# Patient Record
Sex: Female | Born: 1935
Health system: Southern US, Community
[De-identification: ages and names within clinical notes are randomized; demographics above are authoritative.]

## PROBLEM LIST (undated history)

## (undated) DIAGNOSIS — M1990 Unspecified osteoarthritis, unspecified site: Secondary | ICD-10-CM

## (undated) DIAGNOSIS — Z83518 Family history of other specified eye disorder: Secondary | ICD-10-CM

## (undated) DIAGNOSIS — M810 Age-related osteoporosis without current pathological fracture: Secondary | ICD-10-CM

## (undated) DIAGNOSIS — I4891 Unspecified atrial fibrillation: Secondary | ICD-10-CM

## (undated) DIAGNOSIS — I10 Essential (primary) hypertension: Secondary | ICD-10-CM

## (undated) DIAGNOSIS — E785 Hyperlipidemia, unspecified: Secondary | ICD-10-CM

## (undated) DIAGNOSIS — H269 Unspecified cataract: Secondary | ICD-10-CM

## (undated) HISTORY — DX: Unspecified osteoarthritis, unspecified site: M19.90

## (undated) HISTORY — DX: Unspecified atrial fibrillation: I48.91

## (undated) HISTORY — PX: CATARACT EXTRACTION: SUR2

## (undated) HISTORY — PX: APPENDECTOMY: SHX54

## (undated) HISTORY — DX: Unspecified cataract: H26.9

## (undated) HISTORY — DX: Family history of other specified eye disorder: Z83.518

## (undated) HISTORY — PX: VAGINAL HYSTERECTOMY: SUR661

## (undated) HISTORY — PX: TONSILLECTOMY: SUR1361

## (undated) HISTORY — DX: Hyperlipidemia, unspecified: E78.5

## (undated) HISTORY — DX: Essential (primary) hypertension: I10

## (undated) HISTORY — DX: Age-related osteoporosis without current pathological fracture: M81.0

## (undated) HISTORY — PX: OTHER SURGICAL HISTORY: SHX169

---

## 2006-05-03 HISTORY — PX: TOTAL HIP ARTHROPLASTY: SHX124

## 2012-05-03 DIAGNOSIS — I2699 Other pulmonary embolism without acute cor pulmonale: Secondary | ICD-10-CM

## 2012-05-03 HISTORY — DX: Other pulmonary embolism without acute cor pulmonale: I26.99

## 2019-11-02 ENCOUNTER — Encounter: Payer: Self-pay | Admitting: Internal Medicine

## 2019-11-02 ENCOUNTER — Ambulatory Visit (INDEPENDENT_AMBULATORY_CARE_PROVIDER_SITE_OTHER): Payer: Medicare Other | Admitting: Internal Medicine

## 2019-11-02 VITALS — BP 120/60 | HR 99 | Ht 63.0 in | Wt 148.0 lb

## 2019-11-02 DIAGNOSIS — R109 Unspecified abdominal pain: Secondary | ICD-10-CM

## 2019-11-02 DIAGNOSIS — R0789 Other chest pain: Secondary | ICD-10-CM | POA: Diagnosis not present

## 2019-11-02 DIAGNOSIS — K5900 Constipation, unspecified: Secondary | ICD-10-CM

## 2019-11-02 MED ORDER — BISACODYL EC 5 MG PO TBEC
DELAYED_RELEASE_TABLET | ORAL | 0 refills | Status: AC
Start: 2019-11-02 — End: ?

## 2019-11-02 MED ORDER — IBUPROFEN 200 MG PO CAPS: 400.0000 mg | ORAL_CAPSULE | Freq: Three times a day (TID) | ORAL | 0 refills | Status: DC

## 2019-11-02 MED ORDER — DICLOFENAC SODIUM 1 % EX GEL: 4.0000 g | Freq: Four times a day (QID) | CUTANEOUS | Status: DC

## 2019-11-02 NOTE — Progress Notes (Signed)
Caitlin Wilson 86 y.o. November 08, 1927 [id number]  Assessment & Plan:   Encounter Diagnoses  Name Primary?  . Abdominal wall pain Yes  . Xiphodynia   . Acute constipation     The history, examination, and negative CT scan all supports this diagnosis of abdominal wall pain and xiphoid tinea.  The constipation is probably related to not eating and may be less mobility.  Will treat as follows, using some over-the-counter ibuprofen 400 mg 3 times daily for a few days.  I do not think this would cause major kidney damage and she can take the pantoprazole prescribed but not yet started to reduce any risk of significant gastric irritation.  Moist heat bracing abdominal wall conservative measures.  If this fails to work then consider abdominal wall injection.  Bisacodyl for constipation  I still plan to review the labs though based upon what we know I think her CBC CMET lipase were all normal.  Meds ordered this encounter  Medications  . diclofenac Sodium (VOLTAREN) 1 % GEL    Sig: Apply 4 g topically 4 (four) times daily.  . Ibuprofen 200 MG CAPS    Sig: Take 2 capsules (400 mg total) by mouth in the morning, at noon, and at bedtime. X 3-4 days    Dispense:  120 capsule    Refill:  0  . bisacodyl (BISACODYL) 5 MG EC tablet    Sig: 1-2 daily as needed for constipation    Dispense:  30 tablet    Refill:  0   Follow-up can be as needed  I appreciate the opportunity to care for this patient.  CC: Caitlin Mallet, MD   Subjective:   Chief Complaint: Epigastric pain  HPI 86 year old divorced white woman who developed epigastric pain several days ago after returning from the beach.  She moved her luggage etc. does not remember an injury but she had sharp upper abdominal pains and thought maybe she had pulled a muscle.  She felt better the next day and then the symptoms came back where she had sharp fairly significant pains with movement bending etc.  Over time she became somewhat  constipated and has not moved her bowels much but she is not been eating either.  There is no radiation into the back.  No fever or chills.  No nausea or vomiting.  Does not recall having problems like this before.  No chronic GI issues.  She went to the emergency department in [facility] yesterday where a CT scan was unrevealing.  Records reviewed.  There was a focal area of wall narrowing along the rectosigmoid colon but nothing in the upper abdomen that would explain her pain.  She has been treated with tramadol with some relief.  I do not have her labs but we have requested those.  She is accompanied by family members 1 of whom is a physician today. No Known Allergies Current Meds  Medication Sig  . aspirin 325 MG tablet Take 325 mg by mouth daily.  Marland Kitchen losartan (COZAAR) 50 MG tablet Take 50 mg by mouth daily.  Marland Kitchen lovastatin (MEVACOR) 10 MG tablet Take 10 mg by mouth daily.  . metoprolol tartrate (LOPRESSOR) 50 MG tablet Take 50 mg by mouth daily.  . Multiple Vitamins-Minerals (PRESERVISION AREDS 2 PO) Take 1 capsule by mouth in the morning and at bedtime.  . traMADol (ULTRAM) 50 MG tablet Take 50 mg by mouth every 4 (four) hours as needed.    Past Medical History:  Diagnosis  Date  . Atrial fibrillation ([place])   . FH: cataracts   . Hyperlipidemia   . Hypertension   . Pulmonary emboli ([place]) 2014   Past Surgical History:  Procedure Laterality Date  . APPENDECTOMY    . CATARACT EXTRACTION Bilateral   . pelvic floor reconstruction    . TONSILLECTOMY     as a baby  . TOTAL HIP ARTHROPLASTY Left 2008  . VAGINAL HYSTERECTOMY     Social History   Social History Narrative   Divorced, 2 sons 1 daughter   Retired    Former smoker   2 alcohol/day   No tobacco/drugs   1 caffeine/day   family history includes Heart disease in her brother, brother, father, mother, sister, and sister; Lung cancer in her sister.   Review of Systems  otherwise negative  Objective:   Physical Exam BP 120/60    Pulse 99   Ht 5\' 3"  (1.6 m)   Wt 148 lb (67.1 kg)   BMI 26.22 kg/m  Elderly NAD In upper abdominal pain with movement  Tender xiphoid Tender epigastrium These sxs markedly increase with abdominal wall tension  Back and CVA without tenderness Alert and oriented x 3

## 2019-11-02 NOTE — Patient Instructions (Signed)
I think you have a bad abdominal wall strain that will get better with rest, moist heat, and some medication.  Try to avoid bending and twisting and do not lift anything that cannot be lifted with one hand with ease.  If you are coughing or sneezing brace the abdomen with a pillow.   Take 400 mg ibuprofen 3 times a day for next few days.  Purchase diclofenac gel and apply 4 grams to the area 3-4 times a day  For the constipation try bisacodyl (5mg ) 1-2 tabs.  Be sure to hydrate - drink plenty of fluids.  If this does not improve enough by next week let us know and we can consider an abdominal wall injection.   I appreciate the opportunity to care for you. Gatha Mayer, MD, [facility]

## 2020-02-14 ENCOUNTER — Ambulatory Visit (INDEPENDENT_AMBULATORY_CARE_PROVIDER_SITE_OTHER): Payer: Medicare Other | Admitting: Internal Medicine

## 2020-02-14 ENCOUNTER — Ambulatory Visit (INDEPENDENT_AMBULATORY_CARE_PROVIDER_SITE_OTHER)
Admission: RE | Admit: 2020-02-14 | Discharge: 2020-02-14 | Disposition: A | Discharge status: Home / Self Care | Payer: Medicare Other | Source: Ambulatory Visit | Attending: Internal Medicine | Admitting: Internal Medicine

## 2020-02-14 ENCOUNTER — Other Ambulatory Visit: Payer: Self-pay

## 2020-02-14 ENCOUNTER — Other Ambulatory Visit (INDEPENDENT_AMBULATORY_CARE_PROVIDER_SITE_OTHER): Payer: Medicare Other

## 2020-02-14 ENCOUNTER — Encounter: Payer: Self-pay | Admitting: Internal Medicine

## 2020-02-14 VITALS — BP 134/60 | HR 88 | Ht 59.5 in | Wt 139.4 lb

## 2020-02-14 DIAGNOSIS — R194 Change in bowel habit: Secondary | ICD-10-CM | POA: Diagnosis not present

## 2020-02-14 DIAGNOSIS — R634 Abnormal weight loss: Secondary | ICD-10-CM | POA: Diagnosis not present

## 2020-02-14 DIAGNOSIS — R1011 Right upper quadrant pain: Secondary | ICD-10-CM

## 2020-02-14 DIAGNOSIS — K219 Gastro-esophageal reflux disease without esophagitis: Secondary | ICD-10-CM | POA: Diagnosis not present

## 2020-02-14 LAB — CBC WITH DIFFERENTIAL/PLATELET
Basophils Absolute: 0 10*3/uL (ref 0.0–0.1)
Basophils Relative: 0.5 % (ref 0.0–3.0)
Eosinophils Absolute: 0.1 10*3/uL (ref 0.0–0.7)
Eosinophils Relative: 1.1 % (ref 0.0–5.0)
HCT: 37.8 % (ref 36.0–46.0)
Hemoglobin: 12.5 g/dL (ref 12.0–15.0)
Lymphocytes Relative: 25.6 % (ref 12.0–46.0)
Lymphs Abs: 2.1 10*3/uL (ref 0.7–4.0)
MCHC: 33.1 g/dL (ref 30.0–36.0)
MCV: 91.6 fl (ref 78.0–100.0)
Monocytes Absolute: 0.9 10*3/uL (ref 0.1–1.0)
Monocytes Relative: 11.2 % (ref 3.0–12.0)
Neutro Abs: 5.1 10*3/uL (ref 1.4–7.7)
Neutrophils Relative %: 61.6 % (ref 43.0–77.0)
Platelets: 370 10*3/uL (ref 150.0–400.0)
RBC: 4.12 Mil/uL (ref 3.87–5.11)
RDW: 14.8 % (ref 11.5–15.5)
WBC: 8.4 10*3/uL (ref 4.0–10.5)

## 2020-02-14 LAB — COMPREHENSIVE METABOLIC PANEL
ALT: 10 U/L (ref 0–35)
AST: 20 U/L (ref 0–37)
Albumin: 4.2 g/dL (ref 3.5–5.2)
Alkaline Phosphatase: 68 U/L (ref 39–117)
BUN: 25 mg/dL — ABNORMAL HIGH (ref 6–23)
CO2: 30 mEq/L (ref 19–32)
Calcium: 10 mg/dL (ref 8.4–10.5)
Chloride: 98 mEq/L (ref 96–112)
Creatinine, Ser: 1.32 mg/dL — ABNORMAL HIGH (ref 0.40–1.20)
GFR: 35.42 mL/min — ABNORMAL LOW (ref >60.00)
Glucose, Bld: 105 mg/dL — ABNORMAL HIGH (ref 70–99)
Potassium: 5.3 mEq/L — ABNORMAL HIGH (ref 3.5–5.1)
Sodium: 134 mEq/L — ABNORMAL LOW (ref 135–145)
Total Bilirubin: 0.5 mg/dL (ref 0.2–1.2)
Total Protein: 7.2 g/dL (ref 6.0–8.3)

## 2020-02-14 LAB — TSH: TSH: 1.58 u[IU]/mL (ref 0.35–4.50)

## 2020-02-14 NOTE — Patient Instructions (Signed)
You have been scheduled for an endoscopy and colonoscopy. Please follow the written instructions given to you at your visit today. Please pick up your prep supplies at the pharmacy within the next 1-3 days. If you use inhalers (even only as needed), please bring them with you on the day of your procedure.  Your provider has requested that you go to the basement level for lab work before leaving today. Press "B" on the elevator. The lab is located at the first door on the left as you exit the elevator.  Due to recent changes in healthcare laws, you may see the results of your imaging and laboratory studies on MyChart before your provider has had a chance to review them.  We understand that in some cases there may be results that are confusing or concerning to you. Not all laboratory results come back in the same time frame and the provider may be waiting for multiple results in order to interpret others.  Please give [place] 48 hours in order for your provider to thoroughly review all the results before contacting the office for clarification of your results.    Please go by the x-ray department before leaving and get an x-ray.  Go back on the pantoprazole daily-Take 30 minutes prior to breakfast.  Apply over the counter Voltaren Gel on her abdominal pain area.   I appreciate the opportunity to care for you. Silvano Rusk, MD, [facility]

## 2020-02-14 NOTE — Progress Notes (Signed)
Caitlin Wilson 86 y.o. 1927/12/13 [id number]  Assessment & Plan:   Encounter Diagnoses  Name Primary?  . Loss of weight Yes  . Change in bowel habits   . RUQ pain   . Gastric reflux    Given this complex of signs and symptoms, and the focal narrowing in the rectosigmoid on prior CT scan, I think an endoscopic evaluation is appropriate.  Unintentional weight loss and the bowel habit changes are concerning.  It may be that she is taking more MiraLAX and she needs accounting for the bowel habit changes though that should make her lose weight.  The hiatal hernia may be causing the reflux.  The right upper quadrant pain and tenderness still seems like it is musculoskeletal to me but not certain.  I have asked her to start pantoprazole 40 mg daily again or an over-the-counter PPI.  She will also use the Voltaren topical gel in the right upper quadrant.  Plan for EGD and colonoscopy when she returns from her trip.The risks and benefits as well as alternatives of endoscopic procedure(s) have been discussed and reviewed. All questions answered. The patient agrees to proceed.  Other work-up as below.  Consider reducing the dose of MiraLAX pending review of x-ray.  Orders Placed This Encounter  Procedures  . DG Abd 2 Views  . CBC with Differential/Platelet  . Comprehensive metabolic panel  . TSH  . Ambulatory referral to Gastroenterology    I appreciate the opportunity to care for this patient. CC: [name], MD    Subjective:   Chief Complaint: Multiple including abdominal pain change in bowels weight loss reflux and bloating  HPI Patient is a 86 year old white woman seen on July 2 with what I thought was abdominal wall pain and is back with several complaints.  She is accompanied by her daughter and granddaughter.  Much of that abdominal pain that she had attributed to an abdominal wall strain is better though she still has intermittent right-sided abdominal pain that  comes and goes and if she lies on her left side and waits it will improve and she will be okay.  It is not related to movement or eating as far she can tell not related to bowel habits.  It does not disturb her sleep.  She has been having problems with her bowels, she was constipated somewhat when I had seen her and she did a bit of a purge and it has been on MiraLAX.  She is having explosive gas and spraying the toilet and having some incontinence episodes.  Stools tend to be loose though at times they are very small and narrow or pebble-like.  No bleeding except occasionally on the toilet paper only.  She feels bloated and like she really is not emptying her bowels completely when she does defecate.  She is also been having problems with regurgitation and reflux.  Food "comes back".  She does not necessarily have dysphagia.  She feels like she might need to vomit but she has not.  There is heartburn as well.  I think she sometimes uses some antacids like Tums.  When she was seen in July I asked her to take pantoprazole while she was taking ibuprofen.  She is not taking that anymore.  Voltaren gel was also something she was using but has stopped using that.  She has also noted some unintentional weight loss.  Wt Readings from Last 3 Encounters:  02/14/20 139 lb 6 oz (63.2 kg)  11/02/19  148 lb (67.1 kg)   CT scanning she had as part of her abdominal pain evaluation in the summer noted diverticulosis and a focal thickening in the rectosigmoid without any type of a mass noted.  She also had a moderate hiatal hernia described.  Laboratory testing at that time was unrevealing.  She is preparing to go to the beach for a couple of weeks and will return in early November.  No Known Allergies Current Meds  Medication Sig  . aspirin 325 MG tablet Take 325 mg by mouth daily.  . bisacodyl (BISACODYL) 5 MG EC tablet 1-2 daily as needed for constipation  . diclofenac Sodium (VOLTAREN) 1 % GEL Apply 4 g  topically 4 (four) times daily.  . Ibuprofen 200 MG CAPS Take 2 capsules (400 mg total) by mouth in the morning, at noon, and at bedtime. X 3-4 days  . losartan (COZAAR) 50 MG tablet Take 50 mg by mouth daily.  Marland Kitchen lovastatin (MEVACOR) 10 MG tablet Take 10 mg by mouth daily.  . metoprolol tartrate (LOPRESSOR) 50 MG tablet Take 50 mg by mouth daily.  . Multiple Vitamins-Minerals (PRESERVISION AREDS 2 PO) Take 1 capsule by mouth in the morning and at bedtime.  . pantoprazole (PROTONIX) 40 MG tablet Daily while taking ibuprofen  . traMADol (ULTRAM) 50 MG tablet Take 50 mg by mouth every 4 (four) hours as needed.    Past Medical History:  Diagnosis Date  . Atrial fibrillation ([place])   . FH: cataracts   . Hyperlipidemia   . Hypertension   . Pulmonary emboli ([place]) 2014   Past Surgical History:  Procedure Laterality Date  . APPENDECTOMY    . CATARACT EXTRACTION Bilateral   . pelvic floor reconstruction    . TONSILLECTOMY     as a baby  . TOTAL HIP ARTHROPLASTY Left 2008  . VAGINAL HYSTERECTOMY     Social History   Social History Narrative   Divorced, 2 sons 1 daughter   Retired    Former smoker   2 alcohol/day   No tobacco/drugs   1 caffeine/day   family history includes Heart disease in her brother, brother, father, mother, sister, and sister; Lung cancer in her sister.   Review of Systems As per HPI  Objective:   Physical Exam BP 134/60 (BP Location: Left Arm, Patient Position: Sitting, Cuff Size: Normal)   Pulse 88   Ht 4' 11.5" (1.511 m) Comment: height measured without shoes  Wt 139 lb 6 oz (63.2 kg)   BMI 27.68 kg/m  Spry elderly white woman in no acute distress The abdomen is slightly protuberant soft she is tender in the right upper quadrant and persistently so with abdominal wall tension there is slight guarding.  The ribs are not tender.  There are no masses.  Bowel sounds are present and normal and there are no bruits.  Rectal exam was performed in the presence  of Patti [place], [place].  There is no fecal impaction.  Normal anoderm except for some slight fleshy tag/external hemorrhoid.

## 2020-03-03 HISTORY — PX: ESOPHAGOGASTRODUODENOSCOPY: SHX1529

## 2020-03-03 HISTORY — PX: COLONOSCOPY: SHX174

## 2020-03-13 ENCOUNTER — Telehealth: Payer: Self-pay

## 2020-03-13 NOTE — Telephone Encounter (Signed)
Called Dr. Macarthur Critchley, S. Office and spoke to [place]. She will fax patient's last office note (02/2020) and last EKG to [place] at [phone] (Att: Sheri)

## 2020-03-13 NOTE — Telephone Encounter (Signed)
-----   Message from Gatha Mayer, MD sent at 03/13/2020  1:32 PM EST ----- Regarding: RE: [place] pt Sure - Barbera Setters - would you contact Dr. Karna Christmas office for latest EKG and office notes on her? - please  She did have negative troponins in ED in July - it is in media section   FYI this is Jay's great aunt and her granddaughter is Dr. Ernest Mallick of [facility]  ----- Message ----- From: Osvaldo Angst, CRNA Sent: 03/13/2020   7:43 AM EST To: Gatha Mayer, MD Subject: [facility] pt                                         Dr. Carlean Purl,  This 86 yo pt is scheduled with you on 11/19.  Unfortunately the documentation of recent PMD visits and ECG are not in Epic.  Would it be possible to obtain these documents so I can evaluate her for care at [facility]?  Thanks,  Osvaldo Angst

## 2020-03-14 ENCOUNTER — Telehealth: Payer: Self-pay

## 2020-03-14 NOTE — Telephone Encounter (Signed)
Dollene Cleveland, RN called yesterday to have these sent to [place]. They have not come yet.

## 2020-03-14 NOTE — Telephone Encounter (Signed)
-----   Message from Gatha Mayer, MD sent at 03/14/2020 12:50 PM EST ----- Regarding: FW: [place] pt Please call her PCP office and ask them for most recent EKG and PCP notes and any cardiology notes  ----- Message ----- From: Osvaldo Angst, CRNA Sent: 03/13/2020   7:43 AM EST To: Gatha Mayer, MD Subject: [facility] pt                                         Dr. Carlean Purl,  This 86 yo pt is scheduled with you on 11/19.  Unfortunately the documentation of recent PMD visits and ECG are not in Epic.  Would it be possible to obtain these documents so I can evaluate her for care at [facility]?  Thanks,  Osvaldo Angst

## 2020-03-21 ENCOUNTER — Encounter: Payer: Self-pay | Admitting: Internal Medicine

## 2020-03-21 ENCOUNTER — Other Ambulatory Visit: Payer: Self-pay

## 2020-03-21 ENCOUNTER — Other Ambulatory Visit: Payer: Self-pay | Admitting: Internal Medicine

## 2020-03-21 ENCOUNTER — Ambulatory Visit (AMBULATORY_SURGERY_CENTER): Payer: Medicare Other | Admitting: Internal Medicine

## 2020-03-21 VITALS — BP 142/60 | HR 84 | Temp 96.9°F | Resp 25 | Ht 59.0 in | Wt 139.0 lb

## 2020-03-21 DIAGNOSIS — K2950 Unspecified chronic gastritis without bleeding: Secondary | ICD-10-CM | POA: Diagnosis not present

## 2020-03-21 DIAGNOSIS — B9681 Helicobacter pylori [H. pylori] as the cause of diseases classified elsewhere: Secondary | ICD-10-CM | POA: Diagnosis not present

## 2020-03-21 DIAGNOSIS — D120 Benign neoplasm of cecum: Secondary | ICD-10-CM | POA: Diagnosis not present

## 2020-03-21 DIAGNOSIS — R634 Abnormal weight loss: Secondary | ICD-10-CM

## 2020-03-21 DIAGNOSIS — K449 Diaphragmatic hernia without obstruction or gangrene: Secondary | ICD-10-CM

## 2020-03-21 DIAGNOSIS — K2970 Gastritis, unspecified, without bleeding: Secondary | ICD-10-CM

## 2020-03-21 DIAGNOSIS — D122 Benign neoplasm of ascending colon: Secondary | ICD-10-CM

## 2020-03-21 DIAGNOSIS — K2100 Gastro-esophageal reflux disease with esophagitis, without bleeding: Secondary | ICD-10-CM | POA: Diagnosis not present

## 2020-03-21 DIAGNOSIS — K5730 Diverticulosis of large intestine without perforation or abscess without bleeding: Secondary | ICD-10-CM

## 2020-03-21 DIAGNOSIS — K222 Esophageal obstruction: Secondary | ICD-10-CM | POA: Diagnosis not present

## 2020-03-21 MED ORDER — ONDANSETRON 4 MG PO TBDP: 4.0000 mg | ORAL_TABLET | Freq: Four times a day (QID) | ORAL | 0 refills | Status: DC | PRN

## 2020-03-21 MED ORDER — SODIUM CHLORIDE 0.9 % IV SOLN: 500.0000 mL | Freq: Once | INTRAVENOUS | Status: DC

## 2020-03-21 MED ORDER — PANTOPRAZOLE SODIUM 40 MG PO TBEC: DELAYED_RELEASE_TABLET | ORAL | 11 refills | Status: AC

## 2020-03-21 MED FILL — ONDANSETRON ODT 4 MG TABLET: 4 | 5 days supply | Qty: 20 | Fill #0

## 2020-03-21 NOTE — Op Note (Signed)
[place] Patient Name: Caitlin Wilson Procedure Date: 03/21/2020 8:44 AM MRN: [id number] Endoscopist: Gatha Mayer , MD Age: 86 Referring MD:  Date of Birth: June 09, 1927 Gender: Female Account #: [id number] Procedure:                Upper GI endoscopy Indications:              Weight loss Medicines:                Propofol per Anesthesia, Monitored Anesthesia Care Procedure:                Pre-Anesthesia Assessment:                           - Prior to the procedure, a History and Physical                            was performed, and patient medications and                            allergies were reviewed. The patient's tolerance of                            previous anesthesia was also reviewed. The risks                            and benefits of the procedure and the sedation                            options and risks were discussed with the patient.                            All questions were answered, and informed consent                            was obtained. Prior Anticoagulants: The patient has                            taken no previous anticoagulant or antiplatelet                            agents. ASA Grade Assessment: III - A patient with                            severe systemic disease. After reviewing the risks                            and benefits, the patient was deemed in                            satisfactory condition to undergo the procedure.                           After obtaining informed consent, the endoscope was  passed under direct vision. Throughout the                            procedure, the patient's blood pressure, pulse, and                            oxygen saturations were monitored continuously. The                            Endoscope was introduced through the mouth, and                            advanced to the second part of duodenum. The upper                            GI endoscopy  was accomplished without difficulty.                            The patient tolerated the procedure well. Scope In: Scope Out: Findings:                 LA Grade B (one or more mucosal breaks greater than                            5 mm, not extending between the tops of two mucosal                            folds) esophagitis was found in the distal                            esophagus. Biopsies were taken with a cold forceps                            for histology. Verification of patient                            identification for the specimen was done. Estimated                            blood loss was minimal.                           One benign-appearing, intrinsic mild stenosis was                            found at the gastroesophageal junction. The                            stenosis was traversed. Biopsies were taken with a                            cold forceps for histology. Verification of patient  identification for the specimen was done. Estimated                            blood loss was minimal.                           A 6 cm hiatal hernia was present.                           Diffuse moderate inflammation characterized by                            erythema, white discoloration, granularity and                            mucus was found in the entire examined stomach.                            Biopsies were taken with a cold forceps for                            histology. Verification of patient identification                            for the specimen was done. Estimated blood loss was                            minimal.                           The examined duodenum was normal.                           The cardia and gastric fundus were normal on                            retroflexion. Complications:            No immediate complications. Estimated Blood Loss:     Estimated blood loss was minimal. Impression:                - LA Grade B reflux esophagitis. Biopsied.                           - Benign-appearing esophageal stenosis. Biopsied.                           - 6 cm hiatal hernia.                           - Chronic gastritis. Biopsied.                           - Normal examined duodenum. Recommendation:           - Patient has a contact number available for  emergencies. The signs and symptoms of potential                            delayed complications were discussed with the                            patient. Return to normal activities tomorrow.                            Written discharge instructions were provided to the                            patient.                           - Resume previous diet.                           - Continue present medications.                           - Await pathology results.                           - See the other procedure note for documentation of                            additional recommendations.                           - Use Protonix (pantoprazole) 40 mg PO daily                            indefinitely. RESTART                           - Await pathology results. Gatha Mayer, MD 03/21/2020 9:45:04 AM This report has been signed electronically.

## 2020-03-21 NOTE — Progress Notes (Signed)
Medical history reviewed with patient, no changes noted. VS assessed by C.W

## 2020-03-21 NOTE — Progress Notes (Signed)
Pt arrives to PACU , family at bedside, pt drowsy, c/o of having to "pee", bedpan given, encouraged pt to pass gas, pt does pass gas, urinates.  Still c/o some tenderness to abdomen.  Abdomen distended but not firm, after 15 mins,  Dr Carlean Purl assesses pt.,pt up to bathroom after monitors were taken off, passes more gas, back to the gurney and repositioned to pass gas, Dr Carlean Purl comes back to assess pt, pt states pain is less than earlier, abdomen remains distended but not firm, states pt does not need rectal tube. Pt repositioned on bed, walked around, back to bathroom and passes more gas, gets dressed with assistance of family, discharged 40 minutes after she was disconnected from the monitor.

## 2020-03-21 NOTE — Progress Notes (Signed)
A/ox3, pleased with MAC, report to RN 

## 2020-03-21 NOTE — Progress Notes (Signed)
Called to room to assist during endoscopic procedure.  Patient ID and intended procedure confirmed with present staff. Received instructions for my participation in the procedure from the performing physician.   Pt vomited in the parking lot.  Family asked if nausea medication could be called in.  Per Dr. Carlean Purl Zofran 4 mg ODT po q 6 hours PRN #20 sent to [facility]

## 2020-03-21 NOTE — Op Note (Signed)
[facility] Patient Name: Caitlin Wilson Procedure Date: 03/21/2020 9:06 AM MRN: [id number] Endoscopist: Gatha Mayer , MD Age: 86 Referring MD:  Date of Birth: [birth year] Gender: Female Account #: [id number] Procedure:                Colonoscopy Indications:              Change in bowel habits, Weight loss Medicines:                Propofol per Anesthesia, Monitored Anesthesia Care Procedure:                Pre-Anesthesia Assessment:                           - Prior to the procedure, a History and Physical                            was performed, and patient medications and                            allergies were reviewed. The patient's tolerance of                            previous anesthesia was also reviewed. The risks                            and benefits of the procedure and the sedation                            options and risks were discussed with the patient.                            All questions were answered, and informed consent                            was obtained. Prior Anticoagulants: The patient has                            taken no previous anticoagulant or antiplatelet                            agents. ASA Grade Assessment: III - A patient with                            severe systemic disease. After reviewing the risks                            and benefits, the patient was deemed in                            satisfactory condition to undergo the procedure.                           - Prior to the procedure, a History and Physical  was performed, and patient medications and                            allergies were reviewed. The patient's tolerance of                            previous anesthesia was also reviewed. The risks                            and benefits of the procedure and the sedation                            options and risks were discussed with the patient.                            All  questions were answered, and informed consent                            was obtained. Prior Anticoagulants: The patient has                            taken no previous anticoagulant or antiplatelet                            agents. ASA Grade Assessment: III - A patient with                            severe systemic disease. After reviewing the risks                            and benefits, the patient was deemed in                            satisfactory condition to undergo the procedure.                           After obtaining informed consent, the colonoscope                            was passed under direct vision. Throughout the                            procedure, the patient's blood pressure, pulse, and                            oxygen saturations were monitored continuously. The                            Colonoscope was introduced through the anus and                            advanced to the the cecum, identified by  appendiceal orifice and ileocecal valve. The                            colonoscopy was performed with difficulty due to                            bowel stenosis. Successful completion of the                            procedure was aided by withdrawing the scope and                            replacing with the adult endoscope. The patient                            tolerated the procedure well. The quality of the                            bowel preparation was excellent. The ileocecal                            valve, appendiceal orifice, and rectum were                            photographed. The bowel preparation used was                            Miralax via split dose instruction. Scope In: 9:07:29 AM Scope Out: 9:30:14 AM Scope Withdrawal Time: 0 hours 10 minutes 6 seconds  Total Procedure Duration: 0 hours 22 minutes 45 seconds  Findings:                 The perianal and digital rectal examinations were                             normal.                           A benign-appearing, intrinsic severe stenosis                            measuring less than one cm (in length) was found in                            the distal sigmoid colon and was traversed.                           Many small and large-mouthed diverticula were found                            in the sigmoid colon and descending colon. There                            was narrowing of the colon in association with the  diverticular opening.                           Three sessile polyps were found in the ascending                            colon and cecum. The polyps were diminutive in                            size. These polyps were removed with a cold snare.                            Resection and retrieval were complete. Verification                            of patient identification for the specimen was                            done. Estimated blood loss was minimal.                           The exam was otherwise without abnormality on                            direct and retroflexion views. Complications:            No immediate complications. Estimated Blood Loss:     Estimated blood loss was minimal. Impression:               - Stricture in the distal sigmoid colon.                            Gastroscope necessary to pass. This looks like                            benign diverticular-associated stricturte                           - Severe diverticulosis in the sigmoid colon and in                            the descending colon. There was narrowing of the                            colon in association with the diverticular opening.                           - Three diminutive polyps in the ascending colon                            and in the cecum, removed with a cold snare.                            Resected and retrieved.                           -  The examination was otherwise normal on  direct                            and retroflexion views. Except for mild right colon                            barotrauma Recommendation:           - Patient has a contact number available for                            emergencies. The signs and symptoms of potential                            delayed complications were discussed with the                            patient. Return to normal activities tomorrow.                            Written discharge instructions were provided to the                            patient.                           - Resume previous diet.                           - Continue present medications.                           - No repeat colonoscopy due to age.                           - Await pathology results.                           - Benefiber 1 tbsp qd, increase or add MiraLax if                            needed Gatha Mayer, MD 03/21/2020 9:52:30 AM This report has been signed electronically.

## 2020-03-21 NOTE — Patient Instructions (Addendum)
There was a hiatal hernia, inflamed esophagus from reflux, and gastritis (stomach inflammation) on the upper exam. Mild narrowing or stricture where esophagus and stomach meet. Biopsies taken.  Colonoscopy had a stricture in sigmoid colon requiring use of gastroscope (small scope) to pass. This is a benign stricture associated with diverticulosis. Severe diverticulosis noted. 3 tiny polyps removed.  All else ok.  Recommendations:  1) Restart and stay on pantoprazole 2) Benefiber 1 tablespoon daily - use MiraLax if that is needed (for good bowel movements) 3) Will call with biopsy results and follow-up plans  I appreciate the opportunity to care for you. Gatha Mayer, MD, [facility]  Handouts given for Polyps, Diverticulosis, Hiatal Hernia, Gastritis and esophagitis.   YOU HAD AN ENDOSCOPIC PROCEDURE TODAY AT [facility]:   Refer to the procedure report that was given to you for any specific questions about what was found during the examination.  If the procedure report does not answer your questions, please call your gastroenterologist to clarify.  If you requested that your care partner not be given the details of your procedure findings, then the procedure report has been included in a sealed envelope for you to review at your convenience later.  YOU SHOULD EXPECT: Some feelings of bloating in the abdomen. Passage of more gas than usual.  Walking can help get rid of the air that was put into your GI tract during the procedure and reduce the bloating. If you had a lower endoscopy (such as a colonoscopy or flexible sigmoidoscopy) you may notice spotting of blood in your stool or on the toilet paper. If you underwent a bowel prep for your procedure, you may not have a normal bowel movement for a few days.  Please Note:  You might notice some irritation and congestion in your nose or some drainage.  This is from the oxygen used during your procedure.  There is no need for concern  and it should clear up in a day or so.  SYMPTOMS TO REPORT IMMEDIATELY:   Following lower endoscopy (colonoscopy or flexible sigmoidoscopy):  Excessive amounts of blood in the stool  Significant tenderness or worsening of abdominal pains  Swelling of the abdomen that is new, acute  Fever of 100F or higher   Following upper endoscopy (EGD)  Vomiting of blood or coffee ground material  New chest pain or pain under the shoulder blades  Painful or persistently difficult swallowing  New shortness of breath  Fever of 100F or higher  Black, tarry-looking stools  For urgent or emergent issues, a gastroenterologist can be reached at any hour by calling [phone]. Do not use MyChart messaging for urgent concerns.    DIET:  We do recommend a small meal at first, but then you may proceed to your regular diet.  Drink plenty of fluids but you should avoid alcoholic beverages for 24 hours.  ACTIVITY:  You should plan to take it easy for the rest of today and you should NOT DRIVE or use heavy machinery until tomorrow (because of the sedation medicines used during the test).    FOLLOW UP: Our staff will call the number listed on your records 48-72 hours following your procedure to check on you and address any questions or concerns that you may have regarding the information given to you following your procedure. If we do not reach you, we will leave a message.  We will attempt to reach you two times.  During this call, we will ask  if you have developed any symptoms of COVID 19. If you develop any symptoms (ie: fever, flu-like symptoms, shortness of breath, cough etc.) before then, please call [phone].  If you test positive for Covid 19 in the 2 weeks post procedure, please call and report this information to [place].    If any biopsies were taken you will be contacted by phone or by letter within the next 1-3 weeks.  Please call us at [phone] if you have not heard about the biopsies in 3  weeks.    SIGNATURES/CONFIDENTIALITY: You and/or your care partner have signed paperwork which will be entered into your electronic medical record.  These signatures attest to the fact that that the information above on your After Visit Summary has been reviewed and is understood.  Full responsibility of the confidentiality of this discharge information lies with you and/or your care-partner.

## 2020-03-24 ENCOUNTER — Telehealth: Payer: Self-pay

## 2020-03-24 NOTE — Telephone Encounter (Signed)
NO ANSWER, MESSAGE LEFT FOR PATIENT. 

## 2020-03-24 NOTE — Telephone Encounter (Signed)
  Follow up Call-  Call back number 03/21/2020  Post procedure Call Back phone  # [phone] or [phone]  Permission to leave phone message Yes     2nd follow up call made.  NALM

## 2020-04-02 ENCOUNTER — Encounter: Payer: Self-pay | Admitting: Internal Medicine

## 2020-04-02 DIAGNOSIS — K2970 Gastritis, unspecified, without bleeding: Secondary | ICD-10-CM

## 2020-04-02 DIAGNOSIS — B9681 Helicobacter pylori [H. pylori] as the cause of diseases classified elsewhere: Secondary | ICD-10-CM

## 2020-04-02 HISTORY — DX: Helicobacter pylori (H. pylori) as the cause of diseases classified elsewhere: B96.81

## 2020-04-02 HISTORY — DX: Gastritis, unspecified, without bleeding: K29.70

## 2020-04-04 ENCOUNTER — Other Ambulatory Visit: Payer: Self-pay

## 2020-04-04 MED ORDER — BISMUTH SUBSALICYLATE 262 MG PO CHEW: 524.0000 mg | CHEWABLE_TABLET | Freq: Four times a day (QID) | ORAL | 0 refills | Status: AC

## 2020-04-04 MED ORDER — DOXYCYCLINE HYCLATE 100 MG PO CAPS: 100.0000 mg | ORAL_CAPSULE | Freq: Two times a day (BID) | ORAL | 0 refills | Status: AC

## 2020-04-04 MED ORDER — METRONIDAZOLE 250 MG PO TABS: 250.0000 mg | ORAL_TABLET | Freq: Four times a day (QID) | ORAL | 0 refills | Status: AC

## 2020-05-22 ENCOUNTER — Ambulatory Visit: Payer: Medicare Other | Admitting: Internal Medicine

## 2020-06-03 ENCOUNTER — Encounter: Payer: Self-pay | Admitting: Internal Medicine

## 2020-06-03 ENCOUNTER — Ambulatory Visit (INDEPENDENT_AMBULATORY_CARE_PROVIDER_SITE_OTHER): Payer: Medicare Other | Admitting: Internal Medicine

## 2020-06-03 VITALS — BP 132/82 | HR 63 | Ht 59.0 in | Wt 140.2 lb

## 2020-06-03 DIAGNOSIS — K2970 Gastritis, unspecified, without bleeding: Secondary | ICD-10-CM | POA: Diagnosis not present

## 2020-06-03 DIAGNOSIS — R1011 Right upper quadrant pain: Secondary | ICD-10-CM | POA: Diagnosis not present

## 2020-06-03 DIAGNOSIS — B9681 Helicobacter pylori [H. pylori] as the cause of diseases classified elsewhere: Secondary | ICD-10-CM

## 2020-06-03 NOTE — Patient Instructions (Addendum)
Your provider has requested that you go to the basement level for lab work before leaving today. Press "B" on the elevator. The lab is located at the first door on the left as you exit the elevator.  Have a wonderful time in sunny [place].  When you return to [place] stop your pantoprazole for 2 weeks before collecting the stool test please.   Due to recent changes in healthcare laws, you may see the results of your imaging and laboratory studies on MyChart before your provider has had a chance to review them.  We understand that in some cases there may be results that are confusing or concerning to you. Not all laboratory results come back in the same time frame and the provider may be waiting for multiple results in order to interpret others.  Please give [place] 48 hours in order for your provider to thoroughly review all the results before contacting the office for clarification of your results.    I appreciate the opportunity to care for you. Silvano Rusk, MD, [facility]

## 2020-06-03 NOTE — Progress Notes (Signed)
Matisse Roskelley Wilner 87 y.o. 03/30/1928 [id number]  Assessment & Plan:   Encounter Diagnoses  Name Primary?  . Helicobacter pylori gastritis Yes  . RUQ pain    She is clinically well at this time with respect to her GI tract.  I will have her hold her pantoprazole for 2 weeks when able and check H. pylori stool antigen to look for eradication.  That will probably occur in March when she returns from [place].  I appreciate the opportunity to care for this patient. CC: [name], MD  Subjective:   Chief Complaint: F/U H pylori gastritis  HPI Ennifer is a 87 year old woman here for follow-up after being evaluated with EGD and colonoscopy in November for loss of weight with right upper quadrant pain and change in bowel habits, and that turned out to show H. pylori gastritis that was treated with quadruple therapy.  She feels well since then.  Her weight is stable.  She is continuing on pantoprazole because she does use ibuprofen at times.  She is preparing to take a trip to [place].  Her daughter is here with her today.  3 diminutive tubular adenomas and diverticulosis are seen in a colonoscopy.  She also has a history of abdominal wall pain evaluated and treated in July 2021.  Wt Readings from Last 3 Encounters:  06/03/20 140 lb 3.2 oz (63.6 kg)  03/21/20 139 lb (63 kg)  02/14/20 139 lb 6 oz (63.2 kg)    Allergies  Allergen Reactions  . Atorvastatin    Current Meds  Medication Sig  . aspirin 325 MG tablet Take 325 mg by mouth daily.  . bisacodyl (BISACODYL) 5 MG EC tablet 1-2 daily as needed for constipation  . losartan (COZAAR) 50 MG tablet Take 50 mg by mouth daily.  Marland Kitchen lovastatin (MEVACOR) 10 MG tablet Take 10 mg by mouth daily.  . metoprolol tartrate (LOPRESSOR) 50 MG tablet Take 50 mg by mouth daily.  . Multiple Vitamins-Minerals (PRESERVISION AREDS 2 PO) Take 1 capsule by mouth in the morning and at bedtime.  . pantoprazole (PROTONIX) 40 MG tablet Daily while taking  ibuprofen  . traMADol (ULTRAM) 50 MG tablet Take 50 mg by mouth every 4 (four) hours as needed.  . [DISCONTINUED] Ibuprofen 200 MG CAPS Take 2 capsules (400 mg total) by mouth in the morning, at noon, and at bedtime. X 3-4 days   Past Medical History:  Diagnosis Date  . Arthritis    Right Knee  . Atrial fibrillation ([place])   . Cataract    Removed Bilaterally  . FH: cataracts   . Helicobacter pylori gastritis 04/02/2020  . Hyperlipidemia   . Hypertension   . Osteoporosis   . Pulmonary emboli ([place]) 2014   Past Surgical History:  Procedure Laterality Date  . APPENDECTOMY    . CATARACT EXTRACTION Bilateral   . COLONOSCOPY  03/2020  . ESOPHAGOGASTRODUODENOSCOPY  03/2020  . pelvic floor reconstruction    . TONSILLECTOMY     as a baby  . TOTAL HIP ARTHROPLASTY Left 2008  . VAGINAL HYSTERECTOMY     Social History   Social History Narrative   Divorced, 2 sons 1 daughter   Retired    Former smoker   2 alcohol/day   No tobacco/drugs   1 caffeine/day   family history includes Heart disease in her brother, brother, father, mother, sister, and sister; Lung cancer in her sister.   Review of Systems As above  Objective:   Physical Exam BP  132/82 (BP Location: Left Arm, Patient Position: Sitting)   Pulse 63   Ht 4\' 11"  (1.499 m)   Wt 140 lb 3.2 oz (63.6 kg)   SpO2 97%   BMI 28.32 kg/m

## 2020-07-21 ENCOUNTER — Other Ambulatory Visit: Payer: Medicare Other

## 2020-07-21 DIAGNOSIS — B9681 Helicobacter pylori [H. pylori] as the cause of diseases classified elsewhere: Secondary | ICD-10-CM

## 2020-07-23 LAB — H. PYLORI ANTIGEN, STOOL: H pylori Ag, Stl: NEGATIVE

## 2022-01-04 ENCOUNTER — Other Ambulatory Visit: Payer: Self-pay

## 2022-01-04 ENCOUNTER — Emergency Department (HOSPITAL_COMMUNITY): Payer: Medicare Other

## 2022-01-04 ENCOUNTER — Encounter (HOSPITAL_COMMUNITY): Payer: Self-pay | Admitting: Emergency Medicine

## 2022-01-04 ENCOUNTER — Inpatient Hospital Stay (HOSPITAL_COMMUNITY)
Admission: EM | Admit: 2022-01-04 | Discharge: 2022-01-07 | DRG: 690 | Disposition: A | Discharge status: Home Health | Payer: Medicare Other | Attending: Internal Medicine | Admitting: Internal Medicine

## 2022-01-04 DIAGNOSIS — Z20822 Contact with and (suspected) exposure to covid-19: Secondary | ICD-10-CM | POA: Diagnosis present

## 2022-01-04 DIAGNOSIS — I447 Left bundle-branch block, unspecified: Secondary | ICD-10-CM | POA: Diagnosis present

## 2022-01-04 DIAGNOSIS — E785 Hyperlipidemia, unspecified: Secondary | ICD-10-CM | POA: Diagnosis present

## 2022-01-04 DIAGNOSIS — D649 Anemia, unspecified: Secondary | ICD-10-CM | POA: Diagnosis present

## 2022-01-04 DIAGNOSIS — Z87891 Personal history of nicotine dependence: Secondary | ICD-10-CM | POA: Diagnosis not present

## 2022-01-04 DIAGNOSIS — K219 Gastro-esophageal reflux disease without esophagitis: Secondary | ICD-10-CM | POA: Diagnosis present

## 2022-01-04 DIAGNOSIS — E875 Hyperkalemia: Secondary | ICD-10-CM | POA: Diagnosis present

## 2022-01-04 DIAGNOSIS — Z79899 Other long term (current) drug therapy: Secondary | ICD-10-CM

## 2022-01-04 DIAGNOSIS — B9620 Unspecified Escherichia coli [E. coli] as the cause of diseases classified elsewhere: Secondary | ICD-10-CM | POA: Diagnosis present

## 2022-01-04 DIAGNOSIS — N390 Urinary tract infection, site not specified: Principal | ICD-10-CM | POA: Diagnosis present

## 2022-01-04 DIAGNOSIS — Z8619 Personal history of other infectious and parasitic diseases: Secondary | ICD-10-CM | POA: Diagnosis not present

## 2022-01-04 DIAGNOSIS — N3000 Acute cystitis without hematuria: Secondary | ICD-10-CM

## 2022-01-04 DIAGNOSIS — I10 Essential (primary) hypertension: Secondary | ICD-10-CM | POA: Diagnosis present

## 2022-01-04 DIAGNOSIS — Z7982 Long term (current) use of aspirin: Secondary | ICD-10-CM | POA: Diagnosis not present

## 2022-01-04 DIAGNOSIS — Z96642 Presence of left artificial hip joint: Secondary | ICD-10-CM | POA: Diagnosis present

## 2022-01-04 DIAGNOSIS — Z801 Family history of malignant neoplasm of trachea, bronchus and lung: Secondary | ICD-10-CM | POA: Diagnosis not present

## 2022-01-04 DIAGNOSIS — E860 Dehydration: Secondary | ICD-10-CM | POA: Diagnosis present

## 2022-01-04 DIAGNOSIS — Z90710 Acquired absence of both cervix and uterus: Secondary | ICD-10-CM | POA: Diagnosis not present

## 2022-01-04 DIAGNOSIS — I480 Paroxysmal atrial fibrillation: Secondary | ICD-10-CM | POA: Diagnosis present

## 2022-01-04 DIAGNOSIS — N179 Acute kidney failure, unspecified: Secondary | ICD-10-CM | POA: Diagnosis present

## 2022-01-04 DIAGNOSIS — M810 Age-related osteoporosis without current pathological fracture: Secondary | ICD-10-CM | POA: Diagnosis present

## 2022-01-04 DIAGNOSIS — Z8249 Family history of ischemic heart disease and other diseases of the circulatory system: Secondary | ICD-10-CM

## 2022-01-04 DIAGNOSIS — E871 Hypo-osmolality and hyponatremia: Secondary | ICD-10-CM | POA: Diagnosis present

## 2022-01-04 DIAGNOSIS — R531 Weakness: Principal | ICD-10-CM

## 2022-01-04 LAB — URINALYSIS, ROUTINE W REFLEX MICROSCOPIC
Bilirubin Urine: NEGATIVE
Glucose, UA: NEGATIVE mg/dL
Hgb urine dipstick: NEGATIVE
Ketones, ur: NEGATIVE mg/dL
Nitrite: NEGATIVE
Protein, ur: NEGATIVE mg/dL
Specific Gravity, Urine: 1.01 (ref 1.005–1.030)
WBC, UA: >50 WBC/hpf — ABNORMAL HIGH (ref 0–5)
pH: 7 (ref 5.0–8.0)

## 2022-01-04 LAB — CBC WITH DIFFERENTIAL/PLATELET
Abs Immature Granulocytes: 0.1 10*3/uL — ABNORMAL HIGH (ref 0.00–0.07)
Basophils Absolute: 0 10*3/uL (ref 0.0–0.1)
Basophils Relative: 1 %
Eosinophils Absolute: 0.1 10*3/uL (ref 0.0–0.5)
Eosinophils Relative: 2 %
HCT: 28.7 % — ABNORMAL LOW (ref 36.0–46.0)
Hemoglobin: 9.9 g/dL — ABNORMAL LOW (ref 12.0–15.0)
Immature Granulocytes: 1 %
Lymphocytes Relative: 22 %
Lymphs Abs: 1.7 10*3/uL (ref 0.7–4.0)
MCH: 29.9 pg (ref 26.0–34.0)
MCHC: 34.5 g/dL (ref 30.0–36.0)
MCV: 86.7 fL (ref 80.0–100.0)
Monocytes Absolute: 1 10*3/uL (ref 0.1–1.0)
Monocytes Relative: 12 %
Neutro Abs: 4.9 10*3/uL (ref 1.7–7.7)
Neutrophils Relative %: 62 %
Platelets: 391 10*3/uL (ref 150–400)
RBC: 3.31 MIL/uL — ABNORMAL LOW (ref 3.87–5.11)
RDW: 14.3 % (ref 11.5–15.5)
WBC: 7.9 10*3/uL (ref 4.0–10.5)
nRBC: 0 % (ref 0.0–0.2)

## 2022-01-04 LAB — CREATININE, SERUM
Creatinine, Ser: 1.42 mg/dL — ABNORMAL HIGH (ref 0.44–1.00)
GFR, Estimated: 35 mL/min — ABNORMAL LOW (ref >60)

## 2022-01-04 LAB — COMPREHENSIVE METABOLIC PANEL
ALT: 12 U/L (ref 0–44)
AST: 20 U/L (ref 15–41)
Albumin: 3.3 g/dL — ABNORMAL LOW (ref 3.5–5.0)
Alkaline Phosphatase: 90 U/L (ref 38–126)
Anion gap: 12 (ref 5–15)
BUN: 22 mg/dL (ref 8–23)
CO2: 22 mmol/L (ref 22–32)
Calcium: 9.5 mg/dL (ref 8.9–10.3)
Chloride: 95 mmol/L — ABNORMAL LOW (ref 98–111)
Creatinine, Ser: 1.31 mg/dL — ABNORMAL HIGH (ref 0.44–1.00)
GFR, Estimated: 38 mL/min — ABNORMAL LOW (ref >60)
Glucose, Bld: 91 mg/dL (ref 70–99)
Potassium: 5.2 mmol/L — ABNORMAL HIGH (ref 3.5–5.1)
Sodium: 129 mmol/L — ABNORMAL LOW (ref 135–145)
Total Bilirubin: 0.7 mg/dL (ref 0.3–1.2)
Total Protein: 6.4 g/dL — ABNORMAL LOW (ref 6.5–8.1)

## 2022-01-04 LAB — CBC
HCT: 27.7 % — ABNORMAL LOW (ref 36.0–46.0)
Hemoglobin: 9.4 g/dL — ABNORMAL LOW (ref 12.0–15.0)
MCH: 29.7 pg (ref 26.0–34.0)
MCHC: 33.9 g/dL (ref 30.0–36.0)
MCV: 87.7 fL (ref 80.0–100.0)
Platelets: 374 10*3/uL (ref 150–400)
RBC: 3.16 MIL/uL — ABNORMAL LOW (ref 3.87–5.11)
RDW: 14.2 % (ref 11.5–15.5)
WBC: 6.7 10*3/uL (ref 4.0–10.5)
nRBC: 0 % (ref 0.0–0.2)

## 2022-01-04 LAB — TROPONIN I (HIGH SENSITIVITY)
Troponin I (High Sensitivity): 36 ng/L — ABNORMAL HIGH (ref <18)
Troponin I (High Sensitivity): 32 ng/L — ABNORMAL HIGH (ref <18)

## 2022-01-04 LAB — POC OCCULT BLOOD, ED: Fecal Occult Bld: NEGATIVE

## 2022-01-04 LAB — SARS CORONAVIRUS 2 BY RT PCR: SARS Coronavirus 2 by RT PCR: NEGATIVE

## 2022-01-04 LAB — LIPASE, BLOOD: Lipase: 98 U/L — ABNORMAL HIGH (ref 11–51)

## 2022-01-04 LAB — BRAIN NATRIURETIC PEPTIDE: B Natriuretic Peptide: 356.8 pg/mL — ABNORMAL HIGH (ref 0.0–100.0)

## 2022-01-04 MED ORDER — DEXTROSE-NACL 5-0.45 % IV SOLN: INTRAVENOUS | Status: DC

## 2022-01-04 MED ORDER — SODIUM CHLORIDE 0.9 % IV SOLN
1.0000 g | INTRAVENOUS | Status: DC
  Administered 2022-01-05 – 2022-01-06 (×2): 1 g via INTRAVENOUS
  Filled 2022-01-04 (×2): qty 10

## 2022-01-04 MED ORDER — ENOXAPARIN SODIUM 30 MG/0.3ML IJ SOSY
30.0000 mg | PREFILLED_SYRINGE | INTRAMUSCULAR | Status: DC
  Administered 2022-01-05 – 2022-01-07 (×3): 30 mg via SUBCUTANEOUS
  Filled 2022-01-04 (×3): qty 0.3

## 2022-01-04 MED ORDER — ACETAMINOPHEN 325 MG PO TABS
650.0000 mg | ORAL_TABLET | Freq: Four times a day (QID) | ORAL | Status: DC | PRN
  Administered 2022-01-07: 650 mg via ORAL
  Filled 2022-01-04: qty 2

## 2022-01-04 MED ORDER — ONDANSETRON HCL 4 MG/2ML IJ SOLN: 4.0000 mg | Freq: Four times a day (QID) | INTRAMUSCULAR | Status: DC | PRN

## 2022-01-04 MED ORDER — ACETAMINOPHEN 650 MG RE SUPP: 650.0000 mg | Freq: Four times a day (QID) | RECTAL | Status: DC | PRN

## 2022-01-04 MED ORDER — LACTATED RINGERS IV BOLUS
1000.0000 mL | Freq: Once | INTRAVENOUS | Status: AC
  Administered 2022-01-04: 1000 mL via INTRAVENOUS

## 2022-01-04 MED ORDER — IOHEXOL 300 MG/ML  SOLN
75.0000 mL | Freq: Once | INTRAMUSCULAR | Status: AC | PRN
  Administered 2022-01-04: 75 mL via INTRAVENOUS

## 2022-01-04 MED ORDER — SODIUM CHLORIDE 0.9 % IV SOLN: 1.0000 g | INTRAVENOUS | Status: DC

## 2022-01-04 MED ORDER — SODIUM CHLORIDE 0.9 % IV SOLN
1.0000 g | Freq: Once | INTRAVENOUS | Status: AC
  Administered 2022-01-04: 1 g via INTRAVENOUS
  Filled 2022-01-04: qty 10

## 2022-01-04 MED ORDER — ONDANSETRON HCL 4 MG PO TABS: 4.0000 mg | ORAL_TABLET | Freq: Four times a day (QID) | ORAL | Status: DC | PRN

## 2022-01-04 NOTE — H&P (Signed)
History and Physical    Patient: Caitlin Wilson LKG:[id number] DOB: [birth year] DOA: 01/04/2022 DOS: the patient was seen and examined on 01/04/2022 PCP: [name], MD  Patient coming from: Home  Chief Complaint:  Chief Complaint  Patient presents with   Weakness   HPI: Caitlin Wilson is a 88 y.o. female with medical history significant of atrial fibrillation currently not on treatment, osteoarthritis, hyperlipidemia, essential hypertension, remote PE, osteoporosis among other things who presents to the ER due to generalized weakness not eating and drinking not doing well.  Patient is also altered.  No fever or chills.  Urinalysis in the ER showed evidence of UTI.  At her age with the symptoms presumed to be generalized weakness due to UTI also dehydration, hyponatremia as well as hemoglobin 9.9.  Patient also has some AKI creatinine is 1.31.  Potassium is 5.2.  Patient is being admitted with UTI and generalized weakness as a result of UTI.  Review of Systems: As mentioned in the history of present illness. All other systems reviewed and are negative. Past Medical History:  Diagnosis Date   Arthritis    Right Knee   Atrial fibrillation ([place])    Cataract    Removed Bilaterally   FH: cataracts    Helicobacter pylori gastritis 04/02/2020   Hyperlipidemia    Hypertension    Osteoporosis    Pulmonary emboli ([place]) 2014   Past Surgical History:  Procedure Laterality Date   APPENDECTOMY     CATARACT EXTRACTION Bilateral    COLONOSCOPY  03/2020   ESOPHAGOGASTRODUODENOSCOPY  03/2020   pelvic floor reconstruction     TONSILLECTOMY     as a baby   TOTAL HIP ARTHROPLASTY Left 2008   VAGINAL HYSTERECTOMY     Social History:  reports that she has quit smoking. Her smoking use included cigarettes. She has never used smokeless tobacco. She reports current alcohol use. She reports that she does not use drugs.  Allergies  Allergen Reactions   Atorvastatin     Family History   Problem Relation Age of Onset   Heart disease Mother    Heart disease Father    Heart disease Sister    Heart disease Brother    Heart disease Brother    Heart disease Sister    Lung cancer Sister    Colon cancer Neg Hx    Esophageal cancer Neg Hx    Prostate cancer Neg Hx    Rectal cancer Neg Hx    Stomach cancer Neg Hx     Prior to Admission medications   Medication Sig Start Date End Date Taking? Authorizing Provider  aspirin 325 MG tablet Take 325 mg by mouth daily.    [provider]  bisacodyl (BISACODYL) 5 MG EC tablet 1-2 daily as needed for constipation 11/02/19   Gatha Mayer, MD  losartan (COZAAR) 50 MG tablet Take 50 mg by mouth daily.    [provider]  lovastatin (MEVACOR) 10 MG tablet Take 10 mg by mouth daily.    [provider]  metoprolol tartrate (LOPRESSOR) 50 MG tablet Take 50 mg by mouth daily.    [provider]  Multiple Vitamins-Minerals (PRESERVISION AREDS 2 PO) Take 1 capsule by mouth in the morning and at bedtime.    [provider]  pantoprazole (PROTONIX) 40 MG tablet Daily while taking ibuprofen 03/21/20   Gatha Mayer, MD  traMADol (ULTRAM) 50 MG tablet Take 50 mg by mouth every 4 (four) hours as  needed.    [provider]    Physical Exam: Vitals:   01/04/22 1338 01/04/22 1554 01/04/22 1600 01/04/22 1745  BP: (!) 143/73 99/66 103/73 (!) 149/62  Pulse: 69 69 62 72  Resp: '18 16 16 16  '$ Temp: 98.5 F (36.9 C) 98.5 F (36.9 C)    TempSrc: Oral     SpO2: 100% 100% 99% 97%   Generally: Weak, chronically ill looking, no distress HEENT: PERRLA, EOMI, no pallor or jaundice Neck: Supple, no JVD no lymphadenopathy Respiratory: Good air entry bilateral no wheezes or crackles Cardiovascular system: Regular rate and rhythm Abdomen: Soft, nontender with positive bowel sounds Extremity: No edema cyanosis or clubbing Skin exam: No rashes or ulcers Neuro exam: Nonfocal Psych: Awake alert  oriented no distress  Data Reviewed:  Sodium is 129, potassium 5.2, chloride 95, creatinine is 1.30, albumin 3.3, lipase of 98, total protein 6.4, BNP 356, troponin 36, hemoglobin 9.9.  Urinalysis showed cloudy urine with a large leukocytes, WBC more than 50 and many bacteria fecal occult blood test is negative, viral screen currently negative.  Chest x-ray showed no acute findings  Assessment and Plan:   #1 generalized weakness: Suspected due to UTI.  Patient will be admitted.  Initiate IV Rocephin.  Urine and blood cultures to be obtained.  Monitor patient closely.  Adjust antibiotics.  PT and OT consultation  #2 UTI: Continue treatment as above.  Await culture results.  #3 essential hypertension: Confirm on resume home regimen  #4 GERD: Continue to monitor on PPI.  Patient had prior history of H. pylori.  #5 hyperkalemia: Mild elevation.  Repeat labs.  Monitor potassium.  #6 hyponatremia: Hydrate.  Monitor sodium level.  #7 hyperlipidemia: Not on statin.  Continue monitoring.  #8 AKI: Hydrate and monitor.  #9 normocytic anemia: Monitor H&H     Advance Care Planning:   Code Status: Not on file full code  Consults: None  Family Communication: Daughter and granddaughter at bedside Severity of Illness: The appropriate patient status for this patient is INPATIENT. Inpatient status is judged to be reasonable and necessary in order to provide the required intensity of service to ensure the patient's safety. The patient's presenting symptoms, physical exam findings, and initial radiographic and laboratory data in the context of their chronic comorbidities is felt to place them at high risk for further clinical deterioration. Furthermore, it is not anticipated that the patient will be medically stable for discharge from the hospital within 2 midnights of admission.   * I certify that at the point of admission it is my clinical judgment that the patient will require inpatient hospital  care spanning beyond 2 midnights from the point of admission due to high intensity of service, high risk for further deterioration and high frequency of surveillance required.*  AuthorBarbette Merino, MD 01/04/2022 6:47 PM  For on call review [email or web address]. Not currently

## 2022-01-04 NOTE — ED Provider Triage Note (Signed)
Emergency Medicine Provider Triage Evaluation Note  Caitlin Wilson , a 88 y.o. female  was evaluated in triage.  Pt complains of weakness. Rapid decline in last 2-3 days per daughter, was ambulating without assitance a few weeks ago. No falls, no changes in medicine. Endorses chest pain, abdominal pain, SOB and dry heaving. .  Review of Systems  Per HPI  Physical Exam  BP (!) 143/73 (BP Location: Right Arm)   Pulse 69   Temp 98.5 F (36.9 C) (Oral)   Resp 18   SpO2 100%  Gen:   Awake, no distress   Resp:  Normal effort  MSK:   Moves extremities without difficulty  Other:  Frail. CN II-XII grossly in tact.   Medical Decision Making  Medically screening exam initiated at 2:03 PM.  Appropriate orders placed.  Caitlin Wilson was informed that the remainder of the evaluation will be completed by another provider, this initial triage assessment does not replace that evaluation, and the importance of remaining in the ED until their evaluation is complete.     Sherrill Raring, PA-C 01/04/22 1404

## 2022-01-04 NOTE — ED Triage Notes (Signed)
Patient here with daughter who reports prior to a week and a half ago patient was independent and able to care for herself, now patient is feeling globally weak, not able to ambulate independently, endorses chills, nausea, with emesis, dry heaving,abdominal pain and  SHOB. Denies chest pain. Denies any recent falls.

## 2022-01-04 NOTE — ED Provider Notes (Signed)
[facility] EMERGENCY DEPARTMENT Provider Note   CSN: [id number] Arrival date & time: 01/04/22  1303     History  Chief Complaint  Patient presents with   Weakness    Caitlin Wilson is a 88 y.o. female. Presenting with progressive weakness.  Family members state they saw her 1-1/2 weeks ago.  She usually lives independently and is able to ambulate and take care of herself.  When they saw her 3 days ago she was already having difficulty ambulating and using her walker more.  She denies any pain or recent falls.  They spent the weekend with her and during that time noted progressive decline, patient now requires assistance with transferring to go to the toilet.  She has also had emesis last night and intermittent abdominal pain.  Emesis is nonbloody.  Denies any melena or hematochezia.  She does use MiraLAX for constipation.  Last bowel movement was yesterday and this morning.  States it was normal.  Weakness Associated symptoms: abdominal pain and vomiting   Associated symptoms: no arthralgias, no chest pain, no cough, no dysuria, no fever, no seizures and no shortness of breath        Home Medications Prior to Admission medications   Medication Sig Start Date End Date Taking? Authorizing Provider  acetaminophen (TYLENOL) 500 MG tablet Take 500 mg by mouth every 6 (six) hours as needed for mild pain.   Yes [provider]  aspirin 325 MG tablet Take 325 mg by mouth daily.   Yes [provider]  bisacodyl (BISACODYL) 5 MG EC tablet 1-2 daily as needed for constipation Patient taking differently: Take 5 mg by mouth in the morning and at bedtime. 11/02/19  Yes Gatha Mayer, MD  hydroxypropyl methylcellulose / hypromellose (ISOPTO TEARS / GONIOVISC) 2.5 % ophthalmic solution Place 1 drop into both eyes daily as needed for dry eyes.   Yes [provider]  ibuprofen (ADVIL) 200 MG tablet Take 400 mg by mouth every 6 (six) hours as needed for  mild pain.   Yes [provider]  lovastatin (MEVACOR) 10 MG tablet Take 10 mg by mouth at bedtime.   Yes [provider]  Menthol, Topical Analgesic, (ICY HOT EX) Apply 1 application  topically daily as needed (ankle pain).   Yes [provider]  metoprolol tartrate (LOPRESSOR) 50 MG tablet Take 50-75 mg by mouth See admin instructions. Take 1 tablet (50 mg) in the morning and then take 1/2 tablet (75 mg) at bedtime   Yes [provider]  olmesartan (BENICAR) 40 MG tablet Take 40 mg by mouth daily. 12/16/21  Yes [provider]  pantoprazole (PROTONIX) 40 MG tablet Daily while taking ibuprofen Patient taking differently: Take 40 mg by mouth daily. 03/21/20  Yes Gatha Mayer, MD  losartan (COZAAR) 50 MG tablet Take 50 mg by mouth daily. Patient not taking: Reported on 01/04/2022    [provider]      Allergies    Atorvastatin    Review of Systems   Review of Systems  Constitutional:  Positive for appetite change. Negative for chills and fever.  HENT:  Negative for ear pain and sore throat.   Eyes:  Negative for pain and visual disturbance.  Respiratory:  Negative for cough and shortness of breath.   Cardiovascular:  Negative for chest pain and palpitations.  Gastrointestinal:  Positive for abdominal pain and vomiting.  Genitourinary:  Negative for dysuria and hematuria.  Musculoskeletal:  Negative  for arthralgias and back pain.  Skin:  Negative for color change, pallor and rash.  Neurological:  Positive for weakness. Negative for seizures and syncope.  All other systems reviewed and are negative.   Physical Exam Updated Vital Signs BP (!) 117/32   Pulse 65   Temp 98.5 F (36.9 C) (Oral)   Resp 20   Ht '4\' 11"'$  (1.499 m)   Wt 63.5 kg Comment: from 2022 records, please update  SpO2 96%   BMI 28.28 kg/m  Physical Exam Vitals and nursing note reviewed.  Constitutional:      General: She is not in acute distress.     Appearance: She is well-developed.  HENT:     Head: Normocephalic and atraumatic.  Eyes:     Conjunctiva/sclera: Conjunctivae normal.  Cardiovascular:     Rate and Rhythm: Normal rate and regular rhythm.     Heart sounds: No murmur heard. Pulmonary:     Effort: Pulmonary effort is normal. No respiratory distress.     Breath sounds: Normal breath sounds.  Abdominal:     General: Abdomen is flat. There is no distension.     Palpations: Abdomen is soft.     Tenderness: There is abdominal tenderness (Right upper quadrant and epigastric).  Musculoskeletal:        General: No swelling.     Cervical back: Normal range of motion and neck supple.  Skin:    General: Skin is warm and dry.     Capillary Refill: Capillary refill takes less than 2 seconds.  Neurological:     Mental Status: She is alert.     GCS: GCS eye subscore is 4. GCS verbal subscore is 5. GCS motor subscore is 6.     Cranial Nerves: Cranial nerves 2-12 are intact.     Sensory: Sensation is intact.     Motor: Motor function is intact.     Coordination: Coordination is intact.     Comments: Symmetric, global weakness throughout  Psychiatric:        Mood and Affect: Mood normal.     ED Results / Procedures / Treatments   Labs (all labs ordered are listed, but only abnormal results are displayed) Labs Reviewed  COMPREHENSIVE METABOLIC PANEL - Abnormal; Notable for the following components:      Result Value   Sodium 129 (*)    Potassium 5.2 (*)    Chloride 95 (*)    Creatinine, Ser 1.31 (*)    Total Protein 6.4 (*)    Albumin 3.3 (*)    GFR, Estimated 38 (*)    All other components within normal limits  CBC WITH DIFFERENTIAL/PLATELET - Abnormal; Notable for the following components:   RBC 3.31 (*)    Hemoglobin 9.9 (*)    HCT 28.7 (*)    Abs Immature Granulocytes 0.10 (*)    All other components within normal limits  URINALYSIS, ROUTINE W REFLEX MICROSCOPIC - Abnormal; Notable for the following components:    APPearance CLOUDY (*)    Leukocytes,Ua LARGE (*)    WBC, UA >50 (*)    Bacteria, UA MANY (*)    All other components within normal limits  BRAIN NATRIURETIC PEPTIDE - Abnormal; Notable for the following components:   B Natriuretic Peptide 356.8 (*)    All other components within normal limits  LIPASE, BLOOD - Abnormal; Notable for the following components:   Lipase 98 (*)    All other components within normal limits  CBC - Abnormal;  Notable for the following components:   RBC 3.16 (*)    Hemoglobin 9.4 (*)    HCT 27.7 (*)    All other components within normal limits  CREATININE, SERUM - Abnormal; Notable for the following components:   Creatinine, Ser 1.42 (*)    GFR, Estimated 35 (*)    All other components within normal limits  TROPONIN I (HIGH SENSITIVITY) - Abnormal; Notable for the following components:   Troponin I (High Sensitivity) 36 (*)    All other components within normal limits  TROPONIN I (HIGH SENSITIVITY) - Abnormal; Notable for the following components:   Troponin I (High Sensitivity) 32 (*)    All other components within normal limits  SARS CORONAVIRUS 2 BY RT PCR  URINE CULTURE  COMPREHENSIVE METABOLIC PANEL  CBC  POC OCCULT BLOOD, ED    EKG EKG Interpretation  Date/Time:  Monday January 04 2022 14:40:17 EDT Ventricular Rate:  68 PR Interval:  156 QRS Duration: 122 QT Interval:  452 QTC Calculation: 480 R Axis:   45 Text Interpretation: Normal sinus rhythm Left bundle branch block Abnormal ECG No previous ECGs available Confirmed by Sherwood Gambler [phone]) on 01/04/2022 3:09:24 PM  Radiology CT ABDOMEN PELVIS W CONTRAST  Result Date: 01/04/2022 CLINICAL DATA:  Acute abdominal pain.  Weakness. EXAM: CT ABDOMEN AND PELVIS WITH CONTRAST TECHNIQUE: Multidetector CT imaging of the abdomen and pelvis was performed using the standard protocol following bolus administration of intravenous contrast. RADIATION DOSE REDUCTION: This exam was performed according  to the departmental dose-optimization program which includes automated exposure control, adjustment of the mA and/or kV according to patient size and/or use of iterative reconstruction technique. CONTRAST:  21m OMNIPAQUE IOHEXOL 300 MG/ML  SOLN COMPARISON:  None Available. FINDINGS: Lower chest: No acute airspace disease or pleural effusion lesion. Heart is normal in size with coronary artery calcifications. There is a hiatal hernia. Hepatobiliary: No focal liver abnormality is seen. No gallstones, gallbladder wall thickening, or biliary dilatation. Pancreas: Age related parenchymal atrophy. No ductal dilatation or inflammation. Spleen: Normal in size without focal abnormality. Adrenals/Urinary Tract: Normal adrenal glands. Extrarenal pelvis configuration, right greater than left. No hydronephrosis. No perinephric edema. No suspicious renal lesion. Unremarkable urinary bladder. Stomach/Bowel: Small to moderate hiatal hernia. Stomach is decompressed. There is no small bowel obstruction or inflammation. The appendix is not visualized, appendectomy per history small to moderate volume of colonic stool. There is diverticulosis from the splenic flexure distally. There is equivocal colonic wall thickening involving the junction of the descending and sigmoid colon, series 6, image 48 with mild adjacent mesenteric stranding. Vascular/Lymphatic: Moderate aortic atherosclerosis. Aortic tortuosity. No aneurysm. Patent portal vein. No enlarged lymph nodes in the abdomen or pelvis. Reproductive: Status post hysterectomy. No adnexal masses. Other: No free air or ascites. No abdominopelvic collection. Minimal fat in the inguinal canals. Musculoskeletal: Left hip arthroplasty. L3 compression fracture with vertebral plasty. Scoliosis with diffuse degenerative change in the spine. No acute osseous abnormalities are seen. IMPRESSION: 1. Equivocal short segment colonic wall thickening involving the junction of the descending and  sigmoid colon with mild adjacent mesenteric stranding, suspicious for colitis. Recommend direct visualization with colonoscopy to exclude the possibility of underlying colonic neoplasm. 2. Left colonic diverticulosis without focal diverticulitis. 3. Small to moderate hiatal hernia. Aortic Atherosclerosis (ICD10-I70.0). Electronically Signed   By: MKeith RakeM.D.   On: 01/04/2022 17:46   DG Chest 2 View  Result Date: 01/04/2022 CLINICAL DATA:  Per ED triage notes: "Patient here with daughter  who reports prior to a week and a half ago patient was independent and able to care for herself, now patient is feeling globally weak, not able to ambulate independently, endorses chills, nausea, with emesis, dry heaving,abdominal pain and SHOB. Denies chest pain. Denies any recent falls." EXAM: CHEST - 2 VIEW COMPARISON:  None. FINDINGS: Cardiac silhouette borderline enlarged. No mediastinal or hilar masses. No evidence of adenopathy. Mild pleuroparenchymal scarring at the apices. Mild linear scarring or atelectasis at the left lung base. Lungs are hyperexpanded, otherwise clear. No pleural effusion or pneumothorax. Severe compression fracture of a midthoracic vertebra, likely chronic but of unclear chronicity. No other fractures. Skeletal structures are demineralized. IMPRESSION: 1. No acute cardiopulmonary disease. 2. Severe compression fracture of a midthoracic vertebra of unclear chronicity. Electronically Signed   By: Lajean Manes M.D.   On: 01/04/2022 15:24    Procedures Procedures    Medications Ordered in ED Medications  enoxaparin (LOVENOX) injection 30 mg (has no administration in time range)  dextrose 5 %-0.45 % sodium chloride infusion ( Intravenous New Bag/Given 01/04/22 2050)  acetaminophen (TYLENOL) tablet 650 mg (has no administration in time range)    Or  acetaminophen (TYLENOL) suppository 650 mg (has no administration in time range)  ondansetron (ZOFRAN) tablet 4 mg (has no administration  in time range)    Or  ondansetron (ZOFRAN) injection 4 mg (has no administration in time range)  cefTRIAXone (ROCEPHIN) 1 g in sodium chloride 0.9 % 100 mL IVPB (has no administration in time range)  lactated ringers bolus 1,000 mL (0 mLs Intravenous Stopped 01/04/22 2014)  iohexol (OMNIPAQUE) 300 MG/ML solution 75 mL (75 mLs Intravenous Contrast Given 01/04/22 1727)  cefTRIAXone (ROCEPHIN) 1 g in sodium chloride 0.9 % 100 mL IVPB (0 g Intravenous Stopped 01/04/22 2148)    ED Course/ Medical Decision Making/ A&P                           Medical Decision Making Amount and/or Complexity of Data Reviewed Labs: ordered. Radiology: ordered.  Risk Prescription drug management. Decision regarding hospitalization.   88 year old female with PMH A fib, HLD, HTN, PE, osteoporosis, appendectomy.  Not currently on anticoagulation per chart review and family members.  Presenting with progressive weakness over the past week, with associated new intermittent abdominal pain and emesis.   Seen by GI, last visit over 1 year ago after EGD and colonoscopy for weight loss and RUQ pain. Treated with quadruple therapy for H pylori gastritis. Diverticulosis seen on colonoscopy, along with 3 tubular adenomas.   Differential diagnosis includes anemia, UTI, biliary disease, AKI, electrolyte abnormalities, diverticulitis, dehydration.  Labs reviewed and notable for Hgb 9.9, down from 12.5 1 year ago.  Urinalysis concerning for infection.  Hyponatremia to 129.  Retinae 1.31, similar to baseline.  Mildly elevated lipase to 98.  Mildly elevated troponin 36, but downtrending to 32.  Rectal exam negative for blood or hard stool. Hemoccult negative, but minimal stool in rectum.  CT abdomen pelvis did show narrowing in the colon and with mesenteric stranding concerning for colitis, but colonoscopy was also recommended.  It appeared that this is similar to the prior CT little over 1 year ago after which patient did have a  colonoscopy.  I discussed this finding with the patient and her family.  Asked x-ray not concerning for pneumonia/focal opacity.  Overall, suspect patient has progressive weakness due to UTI and dehydration.  Rocephin given.  Patient was admitted  to hospital service.        Final Clinical Impression(s) / ED Diagnoses Final diagnoses:  Weakness  Lower urinary tract infectious disease    Rx / DC Orders ED Discharge Orders     None         Rosine Abe, MD 01/04/22 2341    Sherwood Gambler, MD 01/05/22 1625

## 2022-01-04 NOTE — Unmapped (Signed)
Formatting of this note is different from the original.  Emergency Medicine Provider Triage Evaluation Note    Mary Woodward , a 88 y.o. female  was evaluated in triage.  Pt complains of weakness. Rapid decline in last 2-3 days per daughter, was ambulating without assitance a few weeks ago. No falls, no changes in medicine. Endorses chest pain, abdominal pain, SOB and dry heaving. .    Review of Systems   Per HPI    Physical Exam   BP (!) 143/73 (BP Location: Right Arm)   Pulse 69   Temp 98.5 F (36.9 C) (Oral)   Resp 18   SpO2 100%   Gen:   Awake, no distress    Resp:  Normal effort   MSK:   Moves extremities without difficulty   Other:  Frail. CN II-XII grossly in tact.     Medical Decision Making   Medically screening exam initiated at 2:03 PM.  Appropriate orders placed.  Mary Woodward was informed that the remainder of the evaluation will be completed by another provider, this initial triage assessment does not replace that evaluation, and the importance of remaining in the ED until their evaluation is complete.      Theron Arista, PA-C  01/04/22 1404    Electronically signed by Linwood Dibbles, MD at 01/08/2022  8:18 AM EDT

## 2022-01-04 NOTE — ED Provider Notes (Signed)
Formatting of this note is different from the original.  Images from the original note were not included.    [facility] EMERGENCY DEPARTMENT  Provider Note    CSN: [id number]  Arrival date & time: 01/04/22  1303        History    Chief Complaint   Patient presents with    Weakness     Mary Woodward is a 88 y.o. female.  Presenting with progressive weakness.  Family members state they saw her 1-1/2 weeks ago.  She usually lives independently and is able to ambulate and take care of herself.  When they saw her 3 days ago she was already having difficulty ambulating and using her walker more.  She denies any pain or recent falls.  They spent the weekend with her and during that time noted progressive decline, patient now requires assistance with transferring to go to the toilet.  She has also had emesis last night and intermittent abdominal pain.  Emesis is nonbloody.  Denies any melena or hematochezia.  She does use MiraLAX for constipation.  Last bowel movement was yesterday and this morning.  States it was normal.    Weakness  Associated symptoms: abdominal pain and vomiting    Associated symptoms: no arthralgias, no chest pain, no cough, no dysuria, no fever, no seizures and no shortness of breath          Home Medications  Prior to Admission medications    Medication Sig Start Date End Date Taking? Authorizing Provider   acetaminophen (TYLENOL) 500 MG tablet Take 500 mg by mouth every 6 (six) hours as needed for mild pain.   Yes [provider]   aspirin 325 MG tablet Take 325 mg by mouth daily.   Yes [provider]   bisacodyl (BISACODYL) 5 MG EC tablet 1-2 daily as needed for constipation  Patient taking differently: Take 5 mg by mouth in the morning and at bedtime. 11/02/19  Yes Iva Boop, MD   hydroxypropyl methylcellulose / hypromellose (ISOPTO TEARS / GONIOVISC) 2.5 % ophthalmic solution Place 1 drop into both eyes daily as needed for dry eyes.   Yes [provider]   ibuprofen (ADVIL) 200 MG tablet Take 400 mg by mouth every 6 (six) hours as needed for mild pain.   Yes [provider]   lovastatin (MEVACOR) 10 MG tablet Take 10 mg by mouth at bedtime.   Yes [provider]   Menthol, Topical Analgesic, (ICY HOT EX) Apply 1 application  topically daily as needed (ankle pain).   Yes [provider]   metoprolol tartrate (LOPRESSOR) 50 MG tablet Take 50-75 mg by mouth See admin instructions. Take 1 tablet (50 mg) in the morning and then take 1/2 tablet (75 mg) at bedtime   Yes [provider]   olmesartan (BENICAR) 40 MG tablet Take 40 mg by mouth daily. 12/16/21  Yes [provider]   pantoprazole (PROTONIX) 40 MG tablet Daily while taking ibuprofen  Patient taking differently: Take 40 mg by mouth daily. 03/21/20  Yes Iva Boop, MD   losartan (COZAAR) 50 MG tablet Take 50 mg by mouth daily.  Patient not taking: Reported on 01/04/2022    [provider]       Allergies     Atorvastatin      Review of Systems    Review of Systems   Constitutional:  Positive for appetite change. Negative for chills and fever.  HENT:  Negative for ear pain and sore throat.    Eyes:  Negative for pain and visual disturbance.   Respiratory:  Negative for cough and shortness of breath.    Cardiovascular:  Negative for chest pain and palpitations.   Gastrointestinal:  Positive for abdominal pain and vomiting.   Genitourinary:  Negative for dysuria and hematuria.   Musculoskeletal:  Negative for arthralgias and back pain.   Skin:  Negative for color change, pallor and rash.   Neurological:  Positive for weakness. Negative for seizures and syncope.   All other systems reviewed and are negative.    Physical Exam  Updated Vital Signs  BP (!) 117/32   Pulse 65   Temp 98.5 F (36.9 C) (Oral)   Resp 20   Ht 4\' 11"  (1.499 m)   Wt 63.5 kg Comment: from 2022 records, please update  SpO2 96%   BMI 28.28 kg/m   Physical Exam  Vitals  and nursing note reviewed.   Constitutional:       General: She is not in acute distress.     Appearance: She is well-developed.   HENT:      Head: Normocephalic and atraumatic.   Eyes:      Conjunctiva/sclera: Conjunctivae normal.   Cardiovascular:      Rate and Rhythm: Normal rate and regular rhythm.      Heart sounds: No murmur heard.  Pulmonary:      Effort: Pulmonary effort is normal. No respiratory distress.      Breath sounds: Normal breath sounds.   Abdominal:      General: Abdomen is flat. There is no distension.      Palpations: Abdomen is soft.      Tenderness: There is abdominal tenderness (Right upper quadrant and epigastric).   Musculoskeletal:         General: No swelling.      Cervical back: Normal range of motion and neck supple.   Skin:     General: Skin is warm and dry.      Capillary Refill: Capillary refill takes less than 2 seconds.   Neurological:      Mental Status: She is alert.      GCS: GCS eye subscore is 4. GCS verbal subscore is 5. GCS motor subscore is 6.      Cranial Nerves: Cranial nerves 2-12 are intact.      Sensory: Sensation is intact.      Motor: Motor function is intact.      Coordination: Coordination is intact.      Comments: Symmetric, global weakness throughout   Psychiatric:         Mood and Affect: Mood normal.     ED Results / Procedures / Treatments    Labs  (all labs ordered are listed, but only abnormal results are displayed)  Labs Reviewed   COMPREHENSIVE METABOLIC PANEL - Abnormal; Notable for the following components:       Result Value    Sodium 129 (*)     Potassium 5.2 (*)     Chloride 95 (*)     Creatinine, Ser 1.31 (*)     Total Protein 6.4 (*)     Albumin 3.3 (*)     GFR, Estimated 38 (*)     All other components within normal limits   CBC WITH DIFFERENTIAL/PLATELET - Abnormal; Notable for the following components:    RBC 3.31 (*)     Hemoglobin 9.9 (*)  HCT 28.7 (*)     Abs Immature Granulocytes 0.10 (*)     All other components within normal limits    URINALYSIS, ROUTINE W REFLEX MICROSCOPIC - Abnormal; Notable for the following components:    APPearance CLOUDY (*)     Leukocytes,Ua LARGE (*)     WBC, UA >50 (*)     Bacteria, UA MANY (*)     All other components within normal limits   BRAIN NATRIURETIC PEPTIDE - Abnormal; Notable for the following components:    B Natriuretic Peptide 356.8 (*)     All other components within normal limits   LIPASE, BLOOD - Abnormal; Notable for the following components:    Lipase 98 (*)     All other components within normal limits   CBC - Abnormal; Notable for the following components:    RBC 3.16 (*)     Hemoglobin 9.4 (*)     HCT 27.7 (*)     All other components within normal limits   CREATININE, SERUM - Abnormal; Notable for the following components:    Creatinine, Ser 1.42 (*)     GFR, Estimated 35 (*)     All other components within normal limits   TROPONIN I (HIGH SENSITIVITY) - Abnormal; Notable for the following components:    Troponin I (High Sensitivity) 36 (*)     All other components within normal limits   TROPONIN I (HIGH SENSITIVITY) - Abnormal; Notable for the following components:    Troponin I (High Sensitivity) 32 (*)     All other components within normal limits   SARS CORONAVIRUS 2 BY RT PCR   URINE CULTURE   COMPREHENSIVE METABOLIC PANEL   CBC   POC OCCULT BLOOD, ED     EKG  EKG Interpretation    Date/Time:  Monday January 04 2022 14:40:17 EDT  Ventricular Rate:  68  PR Interval:  156  QRS Duration: 122  QT Interval:  452  QTC Calculation: 480  R Axis:   45  Text Interpretation: Normal sinus rhythm Left bundle branch block Abnormal ECG No previous ECGs available Confirmed by Pricilla Loveless [phone]) on 01/04/2022 3:09:24 PM    Radiology  CT ABDOMEN PELVIS W CONTRAST    Result Date: 01/04/2022  CLINICAL DATA:  Acute abdominal pain.  Weakness. EXAM: CT ABDOMEN AND PELVIS WITH CONTRAST TECHNIQUE: Multidetector CT imaging of the abdomen and pelvis was performed using the standard protocol following bolus  administration of intravenous contrast. RADIATION DOSE REDUCTION: This exam was performed according to the departmental dose-optimization program which includes automated exposure control, adjustment of the mA and/or kV according to patient size and/or use of iterative reconstruction technique. CONTRAST:  75mL OMNIPAQUE IOHEXOL 300 MG/ML  SOLN COMPARISON:  None Available. FINDINGS: Lower chest: No acute airspace disease or pleural effusion lesion. Heart is normal in size with coronary artery calcifications. There is a hiatal hernia. Hepatobiliary: No focal liver abnormality is seen. No gallstones, gallbladder wall thickening, or biliary dilatation. Pancreas: Age related parenchymal atrophy. No ductal dilatation or inflammation. Spleen: Normal in size without focal abnormality. Adrenals/Urinary Tract: Normal adrenal glands. Extrarenal pelvis configuration, right greater than left. No hydronephrosis. No perinephric edema. No suspicious renal lesion. Unremarkable urinary bladder. Stomach/Bowel: Small to moderate hiatal hernia. Stomach is decompressed. There is no small bowel obstruction or inflammation. The appendix is not visualized, appendectomy per history small to moderate volume of colonic stool. There is diverticulosis from the splenic flexure distally. There is equivocal colonic wall thickening  involving the junction of the descending and sigmoid colon, series 6, image 48 with mild adjacent mesenteric stranding. Vascular/Lymphatic: Moderate aortic atherosclerosis. Aortic tortuosity. No aneurysm. Patent portal vein. No enlarged lymph nodes in the abdomen or pelvis. Reproductive: Status post hysterectomy. No adnexal masses. Other: No free air or ascites. No abdominopelvic collection. Minimal fat in the inguinal canals. Musculoskeletal: Left hip arthroplasty. L3 compression fracture with vertebral plasty. Scoliosis with diffuse degenerative change in the spine. No acute osseous abnormalities are seen. IMPRESSION:  1. Equivocal short segment colonic wall thickening involving the junction of the descending and sigmoid colon with mild adjacent mesenteric stranding, suspicious for colitis. Recommend direct visualization with colonoscopy to exclude the possibility of underlying colonic neoplasm. 2. Left colonic diverticulosis without focal diverticulitis. 3. Small to moderate hiatal hernia. Aortic Atherosclerosis (ICD10-I70.0). Electronically Signed   By: Narda Rutherford M.D.   On: 01/04/2022 17:46     DG Chest 2 View    Result Date: 01/04/2022  CLINICAL DATA:  Per ED triage notes: "Patient here with daughter who reports prior to a week and a half ago patient was independent and able to care for herself, now patient is feeling globally weak, not able to ambulate independently, endorses chills, nausea, with emesis, dry heaving,abdominal pain and SHOB. Denies chest pain. Denies any recent falls." EXAM: CHEST - 2 VIEW COMPARISON:  None. FINDINGS: Cardiac silhouette borderline enlarged. No mediastinal or hilar masses. No evidence of adenopathy. Mild pleuroparenchymal scarring at the apices. Mild linear scarring or atelectasis at the left lung base. Lungs are hyperexpanded, otherwise clear. No pleural effusion or pneumothorax. Severe compression fracture of a midthoracic vertebra, likely chronic but of unclear chronicity. No other fractures. Skeletal structures are demineralized. IMPRESSION: 1. No acute cardiopulmonary disease. 2. Severe compression fracture of a midthoracic vertebra of unclear chronicity. Electronically Signed   By: Amie Portland M.D.   On: 01/04/2022 15:24      Procedures  Procedures     Medications Ordered in ED  Medications   enoxaparin (LOVENOX) injection 30 mg (has no administration in time range)   dextrose 5 %-0.45 % sodium chloride infusion ( Intravenous New Bag/Given 01/04/22 2050)   acetaminophen (TYLENOL) tablet 650 mg (has no administration in time range)     Or   acetaminophen (TYLENOL) suppository 650 mg  (has no administration in time range)   ondansetron (ZOFRAN) tablet 4 mg (has no administration in time range)     Or   ondansetron (ZOFRAN) injection 4 mg (has no administration in time range)   cefTRIAXone (ROCEPHIN) 1 g in sodium chloride 0.9 % 100 mL IVPB (has no administration in time range)   lactated ringers bolus 1,000 mL (0 mLs Intravenous Stopped 01/04/22 2014)   iohexol (OMNIPAQUE) 300 MG/ML solution 75 mL (75 mLs Intravenous Contrast Given 01/04/22 1727)   cefTRIAXone (ROCEPHIN) 1 g in sodium chloride 0.9 % 100 mL IVPB (0 g Intravenous Stopped 01/04/22 2148)     ED Course/ Medical Decision Making/ A&P      Medical Decision Making  Amount and/or Complexity of Data Reviewed  Labs: ordered.  Radiology: ordered.    Risk  Prescription drug management.  Decision regarding hospitalization.    88 year old female with PMH A fib, HLD, HTN, PE, osteoporosis, appendectomy.  Not currently on anticoagulation per chart review and family members.  Presenting with progressive weakness over the past week, with associated new intermittent abdominal pain and emesis.     Seen by GI, last visit over 1 year  ago after EGD and colonoscopy for weight loss and RUQ pain. Treated with quadruple therapy for H pylori gastritis. Diverticulosis seen on colonoscopy, along with 3 tubular adenomas.     Differential diagnosis includes anemia, UTI, biliary disease, AKI, electrolyte abnormalities, diverticulitis, dehydration.    Labs reviewed and notable for Hgb 9.9, down from 12.5 1 year ago.  Urinalysis concerning for infection.  Hyponatremia to 129.  Retinae 1.31, similar to baseline.  Mildly elevated lipase to 98.  Mildly elevated troponin 36, but downtrending to 32.    Rectal exam negative for blood or hard stool. Hemoccult negative, but minimal stool in rectum.   CT abdomen pelvis did show narrowing in the colon and with mesenteric stranding concerning for colitis, but colonoscopy was also recommended.  It appeared that this is similar to the  prior CT little over 1 year ago after which patient did have a colonoscopy.  I discussed this finding with the patient and her family.  Asked x-ray not concerning for pneumonia/focal opacity.    Overall, suspect patient has progressive weakness due to UTI and dehydration.  Rocephin given.  Patient was admitted to hospital service.    Final Clinical Impression(s) / ED Diagnoses  Final diagnoses:   Weakness   Lower urinary tract infectious disease     Rx / DC Orders  ED Discharge Orders       None           Kela Millin, MD  01/04/22 1610      Pricilla Loveless, MD  01/05/22 1625    Electronically signed by Pricilla Loveless, MD at 01/05/2022  4:25 PM EDT    Associated attestation - Pricilla Loveless, MD - 01/05/2022  4:25 PM EDT  Formatting of this note might be different from the original.  Attestation: I saw and evaluated the patient, reviewed the resident's note and I agree with the findings and plan.    EKG Interpretation    Date/Time:  Monday January 04 2022 14:40:17 EDT  Ventricular Rate:  68  PR Interval:  156  QRS Duration: 122  QT Interval:  452  QTC Calculation: 480  R Axis:   45  Text Interpretation: Normal sinus rhythm Left bundle branch block Abnormal ECG No previous ECGs available Confirmed by Pricilla Loveless [phone]) on 01/04/2022 3:09:24 PM    Patient presents with generalized weakness to the point that she cannot walk without multiple people holding her up.  This is new from baseline.  Seems to most likely be coming from a urinary tract infection.  She has some transient on and off abdominal pain and some vomiting recently.  CT was obtained and seems to show some questionable colitis though it seemed like a CT over the year ago seem to have similar findings.  I suspect this is primarily urinary tract driven.  We will give Rocephin and admit.

## 2022-01-04 NOTE — ED Triage Notes (Signed)
Formatting of this note might be different from the original.  Patient here with daughter who reports prior to a week and a half ago patient was independent and able to care for herself, now patient is feeling globally weak, not able to ambulate independently, endorses chills, nausea, with emesis, dry heaving,abdominal pain and  SHOB. Denies chest pain. Denies any recent falls.   Electronically signed by Sherrell Puller, RN at 01/04/2022  1:44 PM EDT

## 2022-01-04 NOTE — H&P (Signed)
Formatting of this note is different from the original.  Images from the original note were not included.    History and Physical     Patient: Mary Woodward:[id number] DOB: July 10, 1927  DOA: 01/04/2022  DOS: the patient was seen and examined on 01/04/2022  PCP: Alinda Deem, MD   Patient coming from: Home    Chief Complaint:   Chief Complaint   Patient presents with    Weakness     HPI: Mary Woodward is a 88 y.o. female with medical history significant of atrial fibrillation currently not on treatment, osteoarthritis, hyperlipidemia, essential hypertension, remote PE, osteoporosis among other things who presents to the ER due to generalized weakness not eating and drinking not doing well.  Patient is also altered.  No fever or chills.  Urinalysis in the ER showed evidence of UTI.  At her age with the symptoms presumed to be generalized weakness due to UTI also dehydration, hyponatremia as well as hemoglobin 9.9.  Patient also has some AKI creatinine is 1.31.  Potassium is 5.2.  Patient is being admitted with UTI and generalized weakness as a result of UTI.    Review of Systems: As mentioned in the history of present illness. All other systems reviewed and are negative.  Past Medical History:   Diagnosis Date    Arthritis     Right Knee    Atrial fibrillation (HCC)     Cataract     Removed Bilaterally    FH: cataracts     Helicobacter pylori gastritis 04/02/2020    Hyperlipidemia     Hypertension     Osteoporosis     Pulmonary emboli (HCC) 2014     Past Surgical History:   Procedure Laterality Date    APPENDECTOMY      CATARACT EXTRACTION Bilateral     COLONOSCOPY  03/2020    ESOPHAGOGASTRODUODENOSCOPY  03/2020    pelvic floor reconstruction      TONSILLECTOMY      as a baby    TOTAL HIP ARTHROPLASTY Left 2008    VAGINAL HYSTERECTOMY       Social History:  reports that she has quit smoking. Her smoking use included cigarettes. She has never used smokeless tobacco. She reports current alcohol use. She reports  that she does not use drugs.    Allergies   Allergen Reactions    Atorvastatin      Family History   Problem Relation Age of Onset    Heart disease Mother     Heart disease Father     Heart disease Sister     Heart disease Brother     Heart disease Brother     Heart disease Sister     Lung cancer Sister     Colon cancer Neg Hx     Esophageal cancer Neg Hx     Prostate cancer Neg Hx     Rectal cancer Neg Hx     Stomach cancer Neg Hx      Prior to Admission medications    Medication Sig Start Date End Date Taking? Authorizing Provider   aspirin 325 MG tablet Take 325 mg by mouth daily.    [provider]   bisacodyl (BISACODYL) 5 MG EC tablet 1-2 daily as needed for constipation 11/02/19   Iva Boop, MD   losartan (COZAAR) 50 MG tablet Take 50 mg by mouth daily.    [provider]   lovastatin (MEVACOR) 10 MG tablet Take 10  mg by mouth daily.    [provider]   metoprolol tartrate (LOPRESSOR) 50 MG tablet Take 50 mg by mouth daily.    [provider]   Multiple Vitamins-Minerals (PRESERVISION AREDS 2 PO) Take 1 capsule by mouth in the morning and at bedtime.    [provider]   pantoprazole (PROTONIX) 40 MG tablet Daily while taking ibuprofen 03/21/20   Iva Boop, MD   traMADol (ULTRAM) 50 MG tablet Take 50 mg by mouth every 4 (four) hours as needed.    [provider]     Physical Exam:  Vitals:    01/04/22 1338 01/04/22 1554 01/04/22 1600 01/04/22 1745   BP: (!) 143/73 99/66 103/73 (!) 149/62   Pulse: 69 69 62 72   Resp: 18 16 16 16    Temp: 98.5 F (36.9 C) 98.5 F (36.9 C)     TempSrc: Oral      SpO2: 100% 100% 99% 97%     Generally: Weak, chronically ill looking, no distress  HEENT: PERRLA, EOMI, no pallor or jaundice  Neck: Supple, no JVD no lymphadenopathy  Respiratory: Good air entry bilateral no wheezes or crackles  Cardiovascular system: Regular rate and rhythm  Abdomen: Soft, nontender with positive bowel sounds  Extremity: No edema  cyanosis or clubbing  Skin exam: No rashes or ulcers  Neuro exam: Nonfocal  Psych: Awake alert oriented no distress    Data Reviewed:    Sodium is 129, potassium 5.2, chloride 95, creatinine is 1.30, albumin 3.3, lipase of 98, total protein 6.4, BNP 356, troponin 36, hemoglobin 9.9.  Urinalysis showed cloudy urine with a large leukocytes, WBC more than 50 and many bacteria fecal occult blood test is negative, viral screen currently negative.  Chest x-ray showed no acute findings    Assessment and Plan:    #1 generalized weakness: Suspected due to UTI.  Patient will be admitted.  Initiate IV Rocephin.  Urine and blood cultures to be obtained.  Monitor patient closely.  Adjust antibiotics.  PT and OT consultation    #2 UTI: Continue treatment as above.  Await culture results.    #3 essential hypertension: Confirm on resume home regimen    #4 GERD: Continue to monitor on PPI.  Patient had prior history of H. pylori.    #5 hyperkalemia: Mild elevation.  Repeat labs.  Monitor potassium.    #6 hyponatremia: Hydrate.  Monitor sodium level.    #7 hyperlipidemia: Not on statin.  Continue monitoring.    #8 AKI: Hydrate and monitor.    #9 normocytic anemia: Monitor H&H     Advance Care Planning:   Code Status: Not on file full code    Consults: None    Family Communication: Daughter and granddaughter at bedside  Severity of Illness:  The appropriate patient status for this patient is INPATIENT. Inpatient status is judged to be reasonable and necessary in order to provide the required intensity of service to ensure the patient's safety. The patient's presenting symptoms, physical exam findings, and initial radiographic and laboratory data in the context of their chronic comorbidities is felt to place them at high risk for further clinical deterioration. Furthermore, it is not anticipated that the patient will be medically stable for discharge from the hospital within 2 midnights of admission.     * I certify that at the point  of admission it is my clinical judgment that the patient will require inpatient hospital care spanning beyond 2 midnights from the  point of admission due to high intensity of service, high risk for further deterioration and high frequency of surveillance required.*    AuthorLonia Blood, MD  01/04/2022 6:47 PM    For on call review [email or web address]. Not currently  Electronically signed by Rometta Emery, MD at 01/04/2022 10:45 PM EDT

## 2022-01-05 ENCOUNTER — Inpatient Hospital Stay (HOSPITAL_COMMUNITY): Payer: Medicare Other

## 2022-01-05 DIAGNOSIS — I10 Essential (primary) hypertension: Secondary | ICD-10-CM | POA: Diagnosis not present

## 2022-01-05 DIAGNOSIS — I480 Paroxysmal atrial fibrillation: Secondary | ICD-10-CM

## 2022-01-05 DIAGNOSIS — D649 Anemia, unspecified: Secondary | ICD-10-CM

## 2022-01-05 DIAGNOSIS — E785 Hyperlipidemia, unspecified: Secondary | ICD-10-CM

## 2022-01-05 DIAGNOSIS — N3000 Acute cystitis without hematuria: Secondary | ICD-10-CM | POA: Diagnosis not present

## 2022-01-05 DIAGNOSIS — N179 Acute kidney failure, unspecified: Secondary | ICD-10-CM | POA: Diagnosis not present

## 2022-01-05 DIAGNOSIS — K219 Gastro-esophageal reflux disease without esophagitis: Secondary | ICD-10-CM | POA: Diagnosis not present

## 2022-01-05 DIAGNOSIS — I447 Left bundle-branch block, unspecified: Secondary | ICD-10-CM

## 2022-01-05 LAB — CBC
HCT: 27.5 % — ABNORMAL LOW (ref 36.0–46.0)
Hemoglobin: 9.1 g/dL — ABNORMAL LOW (ref 12.0–15.0)
MCH: 29.3 pg (ref 26.0–34.0)
MCHC: 33.1 g/dL (ref 30.0–36.0)
MCV: 88.4 fL (ref 80.0–100.0)
Platelets: 359 10*3/uL (ref 150–400)
RBC: 3.11 MIL/uL — ABNORMAL LOW (ref 3.87–5.11)
RDW: 14.3 % (ref 11.5–15.5)
WBC: 9.3 10*3/uL (ref 4.0–10.5)
nRBC: 0 % (ref 0.0–0.2)

## 2022-01-05 LAB — COMPREHENSIVE METABOLIC PANEL
ALT: 10 U/L (ref 0–44)
AST: 16 U/L (ref 15–41)
Albumin: 2.8 g/dL — ABNORMAL LOW (ref 3.5–5.0)
Alkaline Phosphatase: 79 U/L (ref 38–126)
Anion gap: 11 (ref 5–15)
BUN: 14 mg/dL (ref 8–23)
CO2: 21 mmol/L — ABNORMAL LOW (ref 22–32)
Calcium: 9 mg/dL (ref 8.9–10.3)
Chloride: 98 mmol/L (ref 98–111)
Creatinine, Ser: 1.19 mg/dL — ABNORMAL HIGH (ref 0.44–1.00)
GFR, Estimated: 43 mL/min — ABNORMAL LOW (ref >60)
Glucose, Bld: 105 mg/dL — ABNORMAL HIGH (ref 70–99)
Potassium: 4.5 mmol/L (ref 3.5–5.1)
Sodium: 130 mmol/L — ABNORMAL LOW (ref 135–145)
Total Bilirubin: 0.5 mg/dL (ref 0.3–1.2)
Total Protein: 5.9 g/dL — ABNORMAL LOW (ref 6.5–8.1)

## 2022-01-05 LAB — ECHOCARDIOGRAM COMPLETE
AR max vel: 3.25 cm2
AV Area VTI: 3.26 cm2
AV Area mean vel: 3.15 cm2
AV Mean grad: 6 mmHg
AV Peak grad: 9.1 mmHg
Ao pk vel: 1.51 m/s
Area-P 1/2: 2.7 cm2
Calc EF: 54.4 %
Height: 59 in
MV M vel: 3.01 m/s
MV Peak grad: 36.1 mmHg
S' Lateral: 2.8 cm
Single Plane A2C EF: 51.3 %
Single Plane A4C EF: 55.3 %
Weight: 2240 oz

## 2022-01-05 LAB — MAGNESIUM: Magnesium: 1.8 mg/dL (ref 1.7–2.4)

## 2022-01-05 MED ORDER — METOPROLOL TARTRATE 25 MG PO TABS: 25.0000 mg | ORAL_TABLET | Freq: Two times a day (BID) | ORAL | Status: DC

## 2022-01-05 MED ORDER — ASPIRIN 325 MG PO TABS
325.0000 mg | ORAL_TABLET | Freq: Every day | ORAL | Status: DC
  Administered 2022-01-05 – 2022-01-07 (×3): 325 mg via ORAL
  Filled 2022-01-05 (×3): qty 1

## 2022-01-05 MED ORDER — METOPROLOL SUCCINATE ER 50 MG PO TB24
50.0000 mg | ORAL_TABLET | Freq: Every day | ORAL | Status: DC
  Administered 2022-01-05 – 2022-01-07 (×3): 50 mg via ORAL
  Filled 2022-01-05: qty 1
  Filled 2022-01-05: qty 2
  Filled 2022-01-05: qty 1

## 2022-01-05 MED ORDER — PRAVASTATIN SODIUM 10 MG PO TABS
10.0000 mg | ORAL_TABLET | Freq: Every day | ORAL | Status: DC
  Administered 2022-01-05 – 2022-01-06 (×2): 10 mg via ORAL
  Filled 2022-01-05 (×2): qty 1

## 2022-01-05 MED ORDER — METOPROLOL TARTRATE 25 MG PO TABS: 50.0000 mg | ORAL_TABLET | ORAL | Status: DC

## 2022-01-05 MED ORDER — PERFLUTREN LIPID MICROSPHERE
1.0000 mL | INTRAVENOUS | Status: AC | PRN
  Administered 2022-01-05: 2 mL via INTRAVENOUS

## 2022-01-05 MED ORDER — PANTOPRAZOLE SODIUM 40 MG PO TBEC
40.0000 mg | DELAYED_RELEASE_TABLET | Freq: Every day | ORAL | Status: DC
  Administered 2022-01-05 – 2022-01-07 (×3): 40 mg via ORAL
  Filled 2022-01-05 (×3): qty 1

## 2022-01-05 NOTE — Evaluation (Signed)
Physical Therapy Evaluation Patient Details Name: Caitlin Wilson MRN: [id number] DOB: May 11, 1927 Today's Date: 01/05/2022  History of Present Illness  Pt is a 88 y/o female admitted secondary to increased weakness. Found to have UTI. PMH includes a fib, HTN, PE, and osteoporosis.  Clinical Impression  Pt admitted secondary to problem above with deficits below. Weakness noted and pt with increased fatigue. Required min guard to min A for mobility tasks. Educated about using RW at home. Reports her kids can assist as needed at d/c. Recommending HHPT at d/c to address current deficits. Will continue to follow acutely.        Recommendations for follow up therapy are one component of a multi-disciplinary discharge planning process, led by the attending physician.  Recommendations may be updated based on patient status, additional functional criteria and insurance authorization.  Follow Up Recommendations Home health PT      Assistance Recommended at Discharge Frequent or constant Supervision/Assistance (initially)  Patient can return home with the following  A little help with bathing/dressing/bathroom;Assistance with cooking/housework;Help with stairs or ramp for entrance;Assist for transportation    Equipment Recommendations None recommended by PT  Recommendations for Other Services       Functional Status Assessment Patient has had a recent decline in their functional status and demonstrates the ability to make significant improvements in function in a reasonable and predictable amount of time.     Precautions / Restrictions Precautions Precautions: Fall Restrictions Weight Bearing Restrictions: No      Mobility  Bed Mobility Overal bed mobility: Needs Assistance Bed Mobility: Supine to Sit, Sit to Supine     Supine to sit: Min assist Sit to supine: Min guard   General bed mobility comments: Min A for trunk assist to come to sitting.    Transfers Overall transfer  level: Needs assistance Equipment used: 1 person hand held assist Transfers: Sit to/from Stand Sit to Stand: Min assist           General transfer comment: Min A for steadying to stand from higher stretcher.    Ambulation/Gait Ambulation/Gait assistance: Min guard Gait Distance (Feet): 15 Feet Assistive device: 1 person hand held assist Gait Pattern/deviations: Step-through pattern, Decreased stride length Gait velocity: decreased     General Gait Details: Took steps forwards and backwards at EOB this session. Mild weakness and shakiness. Min guard for safety. Pt holding to PT arms for support  Stairs            Wheelchair Mobility    Modified Rankin (Stroke Patients Only)       Balance Overall balance assessment: Needs assistance Sitting-balance support: No upper extremity supported, Feet supported Sitting balance-Leahy Scale: Good     Standing balance support: Bilateral upper extremity supported Standing balance-Leahy Scale: Poor Standing balance comment: Reliant on BUE support                             Pertinent Vitals/Pain Pain Assessment Pain Assessment: No/denies pain    Home Living Family/patient expects to be discharged to:: Private residence Living Arrangements: Alone Available Help at Discharge: Family;Available 24 hours/day Type of Home: Apartment Home Access: Elevator       Home Layout: One level Home Equipment: Conservation officer, nature (2 wheels)      Prior Function Prior Level of Function : Independent/Modified Independent             Mobility Comments: Normally independent, but has been using RW  within the last couple days before admission.       Hand Dominance        Extremity/Trunk Assessment   Upper Extremity Assessment Upper Extremity Assessment: Defer to OT evaluation    Lower Extremity Assessment Lower Extremity Assessment: Generalized weakness    Cervical / Trunk Assessment Cervical / Trunk Assessment:  Kyphotic  Communication   Communication: No difficulties  Cognition Arousal/Alertness: Awake/alert Behavior During Therapy: WFL for tasks assessed/performed Overall Cognitive Status: No family/caregiver present to determine baseline cognitive functioning                                          General Comments      Exercises     Assessment/Plan    PT Assessment Patient needs continued PT services  PT Problem List Decreased strength;Decreased activity tolerance;Decreased mobility;Decreased balance       PT Treatment Interventions Gait training;DME instruction;Therapeutic exercise;Therapeutic activities;Functional mobility training;Balance training;Patient/family education    PT Goals (Current goals can be found in the Care Plan section)  Acute Rehab PT Goals Patient Stated Goal: to go home PT Goal Formulation: With patient Time For Goal Achievement: 01/19/22 Potential to Achieve Goals: Good    Frequency Min 3X/week     Co-evaluation               AM-PAC PT "6 Clicks" Mobility  Outcome Measure Help needed turning from your back to your side while in a flat bed without using bedrails?: None Help needed moving from lying on your back to sitting on the side of a flat bed without using bedrails?: A Little Help needed moving to and from a bed to a chair (including a wheelchair)?: A Little Help needed standing up from a chair using your arms (e.g., wheelchair or bedside chair)?: A Little Help needed to walk in hospital room?: A Little Help needed climbing 3-5 steps with a railing? : A Lot 6 Click Score: 18    End of Session Equipment Utilized During Treatment: Gait belt Activity Tolerance: Patient tolerated treatment well Patient left: in bed;with call bell/phone within reach (on stretcher in ED) Nurse Communication: Mobility status PT Visit Diagnosis: Unsteadiness on feet (R26.81);Muscle weakness (generalized) (M62.81);Difficulty in walking, not  elsewhere classified (R26.2)    Time: 7371-0626 PT Time Calculation (min) (ACUTE ONLY): 14 min   Charges:   PT Evaluation $PT Eval Low Complexity: 1 Low          Reuel Derby, PT, DPT  Acute Rehabilitation Services  Office: [phone]   Rudean Hitt 01/05/2022, 4:19 PM

## 2022-01-05 NOTE — Progress Notes (Signed)
  Echocardiogram 2D Echocardiogram has been performed.  Caitlin Wilson 01/05/2022, 4:34 PM

## 2022-01-05 NOTE — Progress Notes (Addendum)
PROGRESS NOTE    Caitlin Wilson  GOT:[id number] DOB: [birth year] DOA: 01/04/2022 PCP: [name], MD   Brief Narrative: Caitlin Wilson is a 88 y.o. female with history of atrial fibrillation, osteoarthritis, hyperlipidemia, primary hypertension, remote PE, osteoporosis. Patient presented secondary to weakness and confusion with concern for UTI and dehydration. Patient was started empirically on Ceftriaxone IV, IV fluids and urine cultures obtained.   Assessment and Plan:  Generalized weakness Concern this is secondary to UTI. Patient with anemia as well, but unsure if this is acute or chronic. Patient is also on metoprolol, but this is a long term medication and patient today reports no associated fatigue. Patient lives at home alone. -PT/OT eval  UTI Patient without symptoms, but concern weakness could be related to acute infection. Urine culture obtained on admission. Empiric Ceftriaxone initiated on admission. -Continue Ceftriaxone IV -Follow-up urine cultures  Hyponatremia Unknown chronicity. Mild. Sodium of 129 on admission. No medications that are likely culprits. -IV fluids -BMP in AM  Possible AKI Unknown true baseline. Creatinine from two years prior of 1.32 with associated hyperkalemia, suggesting possible AKI at that time. Patient with a creatinine of 1.31 with potassium of 5.2 on admission. Patient is on olmesartan and ibuprofen PRN as an outpatient. IV fluids initiated with worsened creatinine to a peak of 1.42 overnight. Creatinine down to 1.19 this morning. -Continue IV fluids -BMP in AM  Colonic wall thickening Involving descending/sigmoid colon. Clinically, unlikely infection however imaging suspicious for colitis. Patient with a history of diverticulosis but does not have symptoms of diverticulitis at this time. Patient will likely need GI follow-up as an outpatient.  Primary hypertension Patient is managed on olmesartan and metoprolol tartrate as an  outpatient. Currently mostly normocytic. Olmesartan on hold secondary to possible AKI -Metoprolol succinate 50 mg  Normocytic anemia No history/evidence of GI bleeding. History of Helicobacter pylori gastritis, treated. -CBC in AM  GERD -Continue Protonix  Hyperlipidemia -Continue Lovastatin (substituted with Pravastatin while inpatient)  History of atrial fibrillation -Currently in sinus rhythm  LBBB Unknown if this is new or old. Noted on EKG this admission. No available Transthoracic Echocardiogram. No chest pain or dyspnea. Patient is on telemetry, with episodes of tachycardia on top of LBBB rhythm appearing like NSVT. -Unless LBBB is old, will obtain Transthoracic Echocardiogram   DVT prophylaxis: Lovenox Code Status:   Code Status: Full Code Family Communication: Granddaughter on telephone Disposition Plan: Home vs SNF likely in 1-2 days pending continued improvement of likely AKI with IV fluids, urine culture results/transition to oral antibiotics, PT/OT recommendations for placement   Consultants:  None  Procedures:  None  Antimicrobials: Ceftriaxone 1 g IV    Subjective: Patient reports no concerns this morning. States she ambulated well with therapy. She reports no ongoing weakness but also states she did not have weakness prior to admission.  Objective: BP (!) 101/49   Pulse 79   Temp 98.5 F (36.9 C) (Oral)   Resp 18   Ht '4\' 11"'$  (1.499 m)   Wt 63.5 kg Comment: from 2022 records, please update  SpO2 98%   BMI 28.28 kg/m   Examination:  General exam: Appears calm and comfortable Respiratory system: Clear to auscultation. Respiratory effort normal. Cardiovascular system: S1 & S2 heard, RRR. No murmurs, rubs, gallops or clicks. Gastrointestinal system: Abdomen is nondistended, soft and nontender. Normal bowel sounds heard. Central nervous system: Alert and oriented to person, place, time and situation. Hard of hearing. Musculoskeletal: No edema. No  calf  tenderness Skin: No cyanosis. No rashes Psychiatry: Judgement and insight appear normal. Mood & affect appropriate.    Data Reviewed: I have personally reviewed following labs and imaging studies  CBC Lab Results  Component Value Date   WBC 9.3 01/05/2022   RBC 3.11 (L) 01/05/2022   HGB 9.1 (L) 01/05/2022   HCT 27.5 (L) 01/05/2022   MCV 88.4 01/05/2022   MCH 29.3 01/05/2022   PLT 359 01/05/2022   MCHC 33.1 01/05/2022   RDW 14.3 01/05/2022   LYMPHSABS 1.7 01/04/2022   MONOABS 1.0 01/04/2022   EOSABS 0.1 01/04/2022   BASOSABS 0.0 18/29/9371     Last metabolic panel Lab Results  Component Value Date   NA 130 (L) 01/05/2022   K 4.5 01/05/2022   CL 98 01/05/2022   CO2 21 (L) 01/05/2022   BUN 14 01/05/2022   CREATININE 1.19 (H) 01/05/2022   GLUCOSE 105 (H) 01/05/2022   GFRNONAA 43 (L) 01/05/2022   CALCIUM 9.0 01/05/2022   PROT 5.9 (L) 01/05/2022   ALBUMIN 2.8 (L) 01/05/2022   BILITOT 0.5 01/05/2022   ALKPHOS 79 01/05/2022   AST 16 01/05/2022   ALT 10 01/05/2022   ANIONGAP 11 01/05/2022    GFR: Estimated Creatinine Clearance: 24.9 mL/min (A) (by C-G formula based on SCr of 1.19 mg/dL (H)).  Recent Results (from the past 240 hour(s))  SARS Coronavirus 2 by RT PCR (hospital order, performed in [facility] hospital lab) *cepheid single result test* Anterior Nasal Swab     Status: None   Collection Time: 01/04/22  4:20 PM   Specimen: Anterior Nasal Swab  Result Value Ref Range Status   SARS Coronavirus 2 by RT PCR NEGATIVE NEGATIVE Final    Comment: (NOTE) SARS-CoV-2 target nucleic acids are NOT DETECTED.  The SARS-CoV-2 RNA is generally detectable in upper and lower respiratory specimens during the acute phase of infection. The lowest concentration of SARS-CoV-2 viral copies this assay can detect is 250 copies / mL. A negative result does not preclude SARS-CoV-2 infection and should not be used as the sole basis for treatment or other patient management  decisions.  A negative result may occur with improper specimen collection / handling, submission of specimen other than nasopharyngeal swab, presence of viral mutation(s) within the areas targeted by this assay, and inadequate number of viral copies (<250 copies / mL). A negative result must be combined with clinical observations, patient history, and epidemiological information.  Fact Sheet for Patients:   https://www.patel.info/  Fact Sheet for Healthcare Providers: https://hall.com/  This test is not yet approved or  cleared by the [place] FDA and has been authorized for detection and/or diagnosis of SARS-CoV-2 by FDA under an Emergency Use Authorization (EUA).  This EUA will remain in effect (meaning this test can be used) for the duration of the COVID-19 declaration under Section 564(b)(1) of the Act, 21 U.S.C. section 360bbb-3(b)(1), unless the authorization is terminated or revoked sooner.  Performed at [facility], [place] [address], [place], [place] [address]       Radiology Studies: CT ABDOMEN PELVIS W CONTRAST  Result Date: 01/04/2022 CLINICAL DATA:  Acute abdominal pain.  Weakness. EXAM: CT ABDOMEN AND PELVIS WITH CONTRAST TECHNIQUE: Multidetector CT imaging of the abdomen and pelvis was performed using the standard protocol following bolus administration of intravenous contrast. RADIATION DOSE REDUCTION: This exam was performed according to the departmental dose-optimization program which includes automated exposure control, adjustment of the mA and/or kV according to patient size  and/or use of iterative reconstruction technique. CONTRAST:  17m OMNIPAQUE IOHEXOL 300 MG/ML  SOLN COMPARISON:  None Available. FINDINGS: Lower chest: No acute airspace disease or pleural effusion lesion. Heart is normal in size with coronary artery calcifications. There is a hiatal hernia. Hepatobiliary: No focal liver abnormality is seen. No  gallstones, gallbladder wall thickening, or biliary dilatation. Pancreas: Age related parenchymal atrophy. No ductal dilatation or inflammation. Spleen: Normal in size without focal abnormality. Adrenals/Urinary Tract: Normal adrenal glands. Extrarenal pelvis configuration, right greater than left. No hydronephrosis. No perinephric edema. No suspicious renal lesion. Unremarkable urinary bladder. Stomach/Bowel: Small to moderate hiatal hernia. Stomach is decompressed. There is no small bowel obstruction or inflammation. The appendix is not visualized, appendectomy per history small to moderate volume of colonic stool. There is diverticulosis from the splenic flexure distally. There is equivocal colonic wall thickening involving the junction of the descending and sigmoid colon, series 6, image 48 with mild adjacent mesenteric stranding. Vascular/Lymphatic: Moderate aortic atherosclerosis. Aortic tortuosity. No aneurysm. Patent portal vein. No enlarged lymph nodes in the abdomen or pelvis. Reproductive: Status post hysterectomy. No adnexal masses. Other: No free air or ascites. No abdominopelvic collection. Minimal fat in the inguinal canals. Musculoskeletal: Left hip arthroplasty. L3 compression fracture with vertebral plasty. Scoliosis with diffuse degenerative change in the spine. No acute osseous abnormalities are seen. IMPRESSION: 1. Equivocal short segment colonic wall thickening involving the junction of the descending and sigmoid colon with mild adjacent mesenteric stranding, suspicious for colitis. Recommend direct visualization with colonoscopy to exclude the possibility of underlying colonic neoplasm. 2. Left colonic diverticulosis without focal diverticulitis. 3. Small to moderate hiatal hernia. Aortic Atherosclerosis (ICD10-I70.0). Electronically Signed   By: MKeith RakeM.D.   On: 01/04/2022 17:46   DG Chest 2 View  Result Date: 01/04/2022 CLINICAL DATA:  Per ED triage notes: "Patient here with  daughter who reports prior to a week and a half ago patient was independent and able to care for herself, now patient is feeling globally weak, not able to ambulate independently, endorses chills, nausea, with emesis, dry heaving,abdominal pain and SHOB. Denies chest pain. Denies any recent falls." EXAM: CHEST - 2 VIEW COMPARISON:  None. FINDINGS: Cardiac silhouette borderline enlarged. No mediastinal or hilar masses. No evidence of adenopathy. Mild pleuroparenchymal scarring at the apices. Mild linear scarring or atelectasis at the left lung base. Lungs are hyperexpanded, otherwise clear. No pleural effusion or pneumothorax. Severe compression fracture of a midthoracic vertebra, likely chronic but of unclear chronicity. No other fractures. Skeletal structures are demineralized. IMPRESSION: 1. No acute cardiopulmonary disease. 2. Severe compression fracture of a midthoracic vertebra of unclear chronicity. Electronically Signed   By: DLajean ManesM.D.   On: 01/04/2022 15:24      LOS: 1 day    RCordelia Poche MD [facility] 01/05/2022, 12:17 PM   If 7PM-7AM, please contact night-coverage [email or web address]

## 2022-01-05 NOTE — Hospital Course (Signed)
Caitlin Wilson is a 88 y.o. female with history of atrial fibrillation, osteoarthritis, hyperlipidemia, primary hypertension, remote PE, osteoporosis. Patient presented secondary to weakness and confusion with concern for UTI and dehydration. Patient was started empirically on Ceftriaxone IV, IV fluids and urine cultures obtained.

## 2022-01-05 NOTE — ED Notes (Signed)
This RN and Engineer, agricultural provided peri-care, changed patient's linens and gown.

## 2022-01-05 NOTE — Unmapped (Signed)
Formatting of this note is different from the original.  Physical Therapy Evaluation  Patient Details  Name: Mary Woodward  MRN: [id number]  DOB: [birth year]  Today's Date: 01/05/2022    History of Present Illness   Pt is a 88 y/o female admitted secondary to increased weakness. Found to have UTI. PMH includes a fib, HTN, PE, and osteoporosis.   Clinical Impression   Pt admitted secondary to problem above with deficits below. Weakness noted and pt with increased fatigue. Required min guard to min A for mobility tasks. Educated about using RW at home. Reports her kids can assist as needed at d/c. Recommending HHPT at d/c to address current deficits. Will continue to follow acutely.         Recommendations for follow up therapy are one component of a multi-disciplinary discharge planning process, led by the attending physician.  Recommendations may be updated based on patient status, additional functional criteria and insurance authorization.    Follow Up Recommendations Home health PT        Assistance Recommended at Discharge Frequent or constant Supervision/Assistance (initially)   Patient can return home with the following   A little help with bathing/dressing/bathroom;Assistance with cooking/housework;Help with stairs or ramp for entrance;Assist for transportation      Equipment Recommendations None recommended by PT   Recommendations for Other Services      Functional Status Assessment Patient has had a recent decline in their functional status and demonstrates the ability to make significant improvements in function in a reasonable and predictable amount of time.       Precautions / Restrictions Precautions  Precautions: Fall  Restrictions  Weight Bearing Restrictions: No         Mobility   Bed Mobility  Overal bed mobility: Needs Assistance  Bed Mobility: Supine to Sit, Sit to Supine      Supine to sit: Min assist  Sit to supine: Min guard    General bed mobility comments: Min A for trunk assist to come to  sitting.      Transfers  Overall transfer level: Needs assistance  Equipment used: 1 person hand held assist  Transfers: Sit to/from Stand  Sit to Stand: Min assist            General transfer comment: Min A for steadying to stand from higher stretcher.      Ambulation/Gait  Ambulation/Gait assistance: Min guard  Gait Distance (Feet): 15 Feet  Assistive device: 1 person hand held assist  Gait Pattern/deviations: Step-through pattern, Decreased stride length  Gait velocity: decreased      General Gait Details: Took steps forwards and backwards at EOB this session. Mild weakness and shakiness. Min guard for safety. Pt holding to PT arms for support    Stairs              Wheelchair Mobility      Modified Rankin (Stroke Patients Only)          Balance Overall balance assessment: Needs assistance  Sitting-balance support: No upper extremity supported, Feet supported  Sitting balance-Leahy Scale: Good      Standing balance support: Bilateral upper extremity supported  Standing balance-Leahy Scale: Poor  Standing balance comment: Reliant on BUE support                            Pertinent Vitals/Pain Pain Assessment  Pain Assessment: No/denies pain     Home Living Family/patient expects to  be discharged to:: Private residence  Living Arrangements: Alone  Available Help at Discharge: Family;Available 24 hours/day  Type of Home: Apartment  Home Access: Elevator        Home Layout: One level  Home Equipment: Agricultural consultant (2 wheels)      Prior Function Prior Level of Function : Independent/Modified Independent              Mobility Comments: Normally independent, but has been using RW within the last couple days before admission.        Hand Dominance         Extremity/Trunk Assessment    Upper Extremity Assessment  Upper Extremity Assessment: Defer to OT evaluation      Lower Extremity Assessment  Lower Extremity Assessment: Generalized weakness      Cervical / Trunk Assessment  Cervical / Trunk Assessment: Kyphotic    Communication    Communication: No difficulties   Cognition Arousal/Alertness: Awake/alert  Behavior During Therapy: WFL for tasks assessed/performed  Overall Cognitive Status: No family/caregiver present to determine baseline cognitive functioning                                            General Comments        Exercises       Assessment/Plan     PT Assessment Patient needs continued PT services   PT Problem List Decreased strength;Decreased activity tolerance;Decreased mobility;Decreased balance        PT Treatment Interventions Gait training;DME instruction;Therapeutic exercise;Therapeutic activities;Functional mobility training;Balance training;Patient/family education      PT Goals (Current goals can be found in the Care Plan section)   Acute Rehab PT Goals  Patient Stated Goal: to go home  PT Goal Formulation: With patient  Time For Goal Achievement: 01/19/22  Potential to Achieve Goals: Good      Frequency Min 3X/week      Co-evaluation                AM-PAC PT "6 Clicks" Mobility   Outcome Measure Help needed turning from your back to your side while in a flat bed without using bedrails?: None  Help needed moving from lying on your back to sitting on the side of a flat bed without using bedrails?: A Little  Help needed moving to and from a bed to a chair (including a wheelchair)?: A Little  Help needed standing up from a chair using your arms (e.g., wheelchair or bedside chair)?: A Little  Help needed to walk in hospital room?: A Little  Help needed climbing 3-5 steps with a railing? : A Lot  6 Click Score: 18      End of Session Equipment Utilized During Treatment: Gait belt  Activity Tolerance: Patient tolerated treatment well  Patient left: in bed;with call bell/phone within reach (on stretcher in ED)  Nurse Communication: Mobility status  PT Visit Diagnosis: Unsteadiness on feet (R26.81);Muscle weakness (generalized) (M62.81);Difficulty in walking, not elsewhere classified (R26.2)      Time:  1027-2536  PT Time Calculation (min) (ACUTE ONLY): 14 min    Charges:   PT Evaluation  $PT Eval Low Complexity: 1 Low          Farley Ly, PT, DPT   Acute Rehabilitation Services   Office: [phone]    Lehman Prom  01/05/2022, 4:19 PM  Electronically signed by Skeet Latch,  Levander Campion, PT at 01/05/2022  4:20 PM EDT

## 2022-01-05 NOTE — Unmapped (Signed)
Formatting of this note might be different from the original.  Mary Woodward is a 88 y.o. female with history of atrial fibrillation, osteoarthritis, hyperlipidemia, primary hypertension, remote PE, osteoporosis. Patient presented secondary to weakness and confusion with concern for UTI and dehydration. Patient was started empirically on Ceftriaxone IV, IV fluids and urine cultures obtained.  Electronically signed by Narda Bonds, MD at 01/05/2022  1:40 PM EDT

## 2022-01-05 NOTE — ED Notes (Signed)
Formatting of this note might be different from the original.  This RN and Holiday representative provided peri-care, changed patient's linens and gown.  Electronically signed by Luisa Hart, RN at 01/05/2022  6:30 AM EDT

## 2022-01-05 NOTE — Progress Notes (Signed)
Formatting of this note might be different from the original.    Echocardiogram  2D Echocardiogram has been performed.    Mary Woodward  01/05/2022, 4:34 PM  Electronically signed by Cathie Hoops R at 01/05/2022  4:35 PM EDT

## 2022-01-05 NOTE — Progress Notes (Signed)
Formatting of this note is different from the original.  Images from the original note were not included.      PROGRESS NOTE    Mary Woodward  ZOX:[id number] DOB: [birth year] DOA: 01/04/2022  PCP: Alinda Deem, MD    Brief Narrative:  Mary Woodward is a 88 y.o. female with history of atrial fibrillation, osteoarthritis, hyperlipidemia, primary hypertension, remote PE, osteoporosis. Patient presented secondary to weakness and confusion with concern for UTI and dehydration. Patient was started empirically on Ceftriaxone IV, IV fluids and urine cultures obtained.    Assessment and Plan:    Generalized weakness  Concern this is secondary to UTI. Patient with anemia as well, but unsure if this is acute or chronic. Patient is also on metoprolol, but this is a long term medication and patient today reports no associated fatigue. Patient lives at home alone.  -PT/OT eval    UTI  Patient without symptoms, but concern weakness could be related to acute infection. Urine culture obtained on admission. Empiric Ceftriaxone initiated on admission.  -Continue Ceftriaxone IV  -Follow-up urine cultures    Hyponatremia  Unknown chronicity. Mild. Sodium of 129 on admission. No medications that are likely culprits.  -IV fluids  -BMP in AM    Possible AKI  Unknown true baseline. Creatinine from two years prior of 1.32 with associated hyperkalemia, suggesting possible AKI at that time. Patient with a creatinine of 1.31 with potassium of 5.2 on admission. Patient is on olmesartan and ibuprofen PRN as an outpatient. IV fluids initiated with worsened creatinine to a peak of 1.42 overnight. Creatinine down to 1.19 this morning.  -Continue IV fluids  -BMP in AM    Colonic wall thickening  Involving descending/sigmoid colon. Clinically, unlikely infection however imaging suspicious for colitis. Patient with a history of diverticulosis but does not have symptoms of diverticulitis at this time. Patient will likely need GI follow-up as an  outpatient.    Primary hypertension  Patient is managed on olmesartan and metoprolol tartrate as an outpatient. Currently mostly normocytic. Olmesartan on hold secondary to possible AKI  -Metoprolol succinate 50 mg    Normocytic anemia  No history/evidence of GI bleeding. History of Helicobacter pylori gastritis, treated.  -CBC in AM    GERD  -Continue Protonix    Hyperlipidemia  -Continue Lovastatin (substituted with Pravastatin while inpatient)    History of atrial fibrillation  -Currently in sinus rhythm    LBBB  Unknown if this is new or old. Noted on EKG this admission. No available Transthoracic Echocardiogram. No chest pain or dyspnea. Patient is on telemetry, with episodes of tachycardia on top of LBBB rhythm appearing like NSVT.  -Unless LBBB is old, will obtain Transthoracic Echocardiogram    DVT prophylaxis: Lovenox  Code Status:   Code Status: Full Code  Family Communication: Granddaughter on telephone  Disposition Plan: Home vs SNF likely in 1-2 days pending continued improvement of likely AKI with IV fluids, urine culture results/transition to oral antibiotics, PT/OT recommendations for placement    Consultants:   None    Procedures:   None    Antimicrobials:  Ceftriaxone 1 g IV     Subjective:  Patient reports no concerns this morning. States she ambulated well with therapy. She reports no ongoing weakness but also states she did not have weakness prior to admission.    Objective:  BP (!) 101/49   Pulse 79   Temp 98.5 F (36.9 C) (Oral)   Resp 18   Ht 4'  11" (1.499 m)   Wt 63.5 kg Comment: from 2022 records, please update  SpO2 98%   BMI 28.28 kg/m     Examination:    General exam: Appears calm and comfortable  Respiratory system: Clear to auscultation. Respiratory effort normal.  Cardiovascular system: S1 & S2 heard, RRR. No murmurs, rubs, gallops or clicks.  Gastrointestinal system: Abdomen is nondistended, soft and nontender. Normal bowel sounds heard.  Central nervous system: Alert and  oriented to person, place, time and situation. Hard of hearing.  Musculoskeletal: No edema. No calf tenderness  Skin: No cyanosis. No rashes  Psychiatry: Judgement and insight appear normal. Mood & affect appropriate.     Data Reviewed: I have personally reviewed following labs and imaging studies    CBC  Lab Results   Component Value Date    WBC 9.3 01/05/2022    RBC 3.11 (L) 01/05/2022    HGB 9.1 (L) 01/05/2022    HCT 27.5 (L) 01/05/2022    MCV 88.4 01/05/2022    MCH 29.3 01/05/2022    PLT 359 01/05/2022    MCHC 33.1 01/05/2022    RDW 14.3 01/05/2022    LYMPHSABS 1.7 01/04/2022    MONOABS 1.0 01/04/2022    EOSABS 0.1 01/04/2022    BASOSABS 0.0 01/04/2022     Last metabolic panel  Lab Results   Component Value Date    NA 130 (L) 01/05/2022    K 4.5 01/05/2022    CL 98 01/05/2022    CO2 21 (L) 01/05/2022    BUN 14 01/05/2022    CREATININE 1.19 (H) 01/05/2022    GLUCOSE 105 (H) 01/05/2022    GFRNONAA 43 (L) 01/05/2022    CALCIUM 9.0 01/05/2022    PROT 5.9 (L) 01/05/2022    ALBUMIN 2.8 (L) 01/05/2022    BILITOT 0.5 01/05/2022    ALKPHOS 79 01/05/2022    AST 16 01/05/2022    ALT 10 01/05/2022    ANIONGAP 11 01/05/2022     GFR:  Estimated Creatinine Clearance: 24.9 mL/min (A) (by C-G formula based on SCr of 1.19 mg/dL (H)).    Recent Results (from the past 240 hour(s))   SARS Coronavirus 2 by RT PCR (hospital order, performed in [facility] hospital lab) *cepheid single result test* Anterior Nasal Swab     Status: None    Collection Time: 01/04/22  4:20 PM    Specimen: Anterior Nasal Swab   Result Value Ref Range Status    SARS Coronavirus 2 by RT PCR NEGATIVE NEGATIVE Final     Comment: (NOTE)  SARS-CoV-2 target nucleic acids are NOT DETECTED.    The SARS-CoV-2 RNA is generally detectable in upper and lower  respiratory specimens during the acute phase of infection. The lowest  concentration of SARS-CoV-2 viral copies this assay can detect is 250  copies / mL. A negative result does not preclude SARS-CoV-2  infection  and should not be used as the sole basis for treatment or other  patient management decisions.  A negative result may occur with  improper specimen collection / handling, submission of specimen other  than nasopharyngeal swab, presence of viral mutation(s) within the  areas targeted by this assay, and inadequate number of viral copies  (<250 copies / mL). A negative result must be combined with clinical  observations, patient history, and epidemiological information.    Fact Sheet for Patients:    RoadLapTop.co.za    Fact Sheet for Healthcare Providers:  http://kim-miller.com/    This  test is not yet approved or  cleared by the [place] and  has been authorized for detection and/or diagnosis of SARS-CoV-2 by  FDA under an Emergency Use Authorization (EUA).  This EUA will remain  in effect (meaning this test can be used) for the duration of the  COVID-19 declaration under Section 564(b)(1) of the Act, 21 U.S.C.  section 360bbb-3(b)(1), unless the authorization is terminated or  revoked sooner.    Performed at [facility], [address] [place], [place]  [address]        Radiology Studies:  CT ABDOMEN PELVIS W CONTRAST    Result Date: 01/04/2022  CLINICAL DATA:  Acute abdominal pain.  Weakness. EXAM: CT ABDOMEN AND PELVIS WITH CONTRAST TECHNIQUE: Multidetector CT imaging of the abdomen and pelvis was performed using the standard protocol following bolus administration of intravenous contrast. RADIATION DOSE REDUCTION: This exam was performed according to the departmental dose-optimization program which includes automated exposure control, adjustment of the mA and/or kV according to patient size and/or use of iterative reconstruction technique. CONTRAST:  75mL OMNIPAQUE IOHEXOL 300 MG/ML  SOLN COMPARISON:  None Available. FINDINGS: Lower chest: No acute airspace disease or pleural effusion lesion. Heart is normal in size with coronary artery  calcifications. There is a hiatal hernia. Hepatobiliary: No focal liver abnormality is seen. No gallstones, gallbladder wall thickening, or biliary dilatation. Pancreas: Age related parenchymal atrophy. No ductal dilatation or inflammation. Spleen: Normal in size without focal abnormality. Adrenals/Urinary Tract: Normal adrenal glands. Extrarenal pelvis configuration, right greater than left. No hydronephrosis. No perinephric edema. No suspicious renal lesion. Unremarkable urinary bladder. Stomach/Bowel: Small to moderate hiatal hernia. Stomach is decompressed. There is no small bowel obstruction or inflammation. The appendix is not visualized, appendectomy per history small to moderate volume of colonic stool. There is diverticulosis from the splenic flexure distally. There is equivocal colonic wall thickening involving the junction of the descending and sigmoid colon, series 6, image 48 with mild adjacent mesenteric stranding. Vascular/Lymphatic: Moderate aortic atherosclerosis. Aortic tortuosity. No aneurysm. Patent portal vein. No enlarged lymph nodes in the abdomen or pelvis. Reproductive: Status post hysterectomy. No adnexal masses. Other: No free air or ascites. No abdominopelvic collection. Minimal fat in the inguinal canals. Musculoskeletal: Left hip arthroplasty. L3 compression fracture with vertebral plasty. Scoliosis with diffuse degenerative change in the spine. No acute osseous abnormalities are seen. IMPRESSION: 1. Equivocal short segment colonic wall thickening involving the junction of the descending and sigmoid colon with mild adjacent mesenteric stranding, suspicious for colitis. Recommend direct visualization with colonoscopy to exclude the possibility of underlying colonic neoplasm. 2. Left colonic diverticulosis without focal diverticulitis. 3. Small to moderate hiatal hernia. Aortic Atherosclerosis (ICD10-I70.0). Electronically Signed   By: Narda Rutherford M.D.   On: 01/04/2022 17:46     DG  Chest 2 View    Result Date: 01/04/2022  CLINICAL DATA:  Per ED triage notes: "Patient here with daughter who reports prior to a week and a half ago patient was independent and able to care for herself, now patient is feeling globally weak, not able to ambulate independently, endorses chills, nausea, with emesis, dry heaving,abdominal pain and SHOB. Denies chest pain. Denies any recent falls." EXAM: CHEST - 2 VIEW COMPARISON:  None. FINDINGS: Cardiac silhouette borderline enlarged. No mediastinal or hilar masses. No evidence of adenopathy. Mild pleuroparenchymal scarring at the apices. Mild linear scarring or atelectasis at the left lung base. Lungs are hyperexpanded, otherwise clear. No pleural effusion or pneumothorax. Severe  compression fracture of a midthoracic vertebra, likely chronic but of unclear chronicity. No other fractures. Skeletal structures are demineralized. IMPRESSION: 1. No acute cardiopulmonary disease. 2. Severe compression fracture of a midthoracic vertebra of unclear chronicity. Electronically Signed   By: Amie Portland M.D.   On: 01/04/2022 15:24       LOS: 1 day     Jacquelin Hawking, MD  [facility]  01/05/2022, 12:17 PM    If 7PM-7AM, please contact night-coverage  [email or web address]    Electronically signed by Narda Bonds, MD at 01/05/2022  2:44 PM EDT

## 2022-01-06 DIAGNOSIS — I10 Essential (primary) hypertension: Secondary | ICD-10-CM | POA: Diagnosis not present

## 2022-01-06 DIAGNOSIS — N3000 Acute cystitis without hematuria: Secondary | ICD-10-CM | POA: Diagnosis not present

## 2022-01-06 DIAGNOSIS — N179 Acute kidney failure, unspecified: Secondary | ICD-10-CM | POA: Diagnosis not present

## 2022-01-06 LAB — CBC
HCT: 29.2 % — ABNORMAL LOW (ref 36.0–46.0)
Hemoglobin: 9.8 g/dL — ABNORMAL LOW (ref 12.0–15.0)
MCH: 29.5 pg (ref 26.0–34.0)
MCHC: 33.6 g/dL (ref 30.0–36.0)
MCV: 88 fL (ref 80.0–100.0)
Platelets: 398 10*3/uL (ref 150–400)
RBC: 3.32 MIL/uL — ABNORMAL LOW (ref 3.87–5.11)
RDW: 14.2 % (ref 11.5–15.5)
WBC: 8.8 10*3/uL (ref 4.0–10.5)
nRBC: 0 % (ref 0.0–0.2)

## 2022-01-06 LAB — BASIC METABOLIC PANEL
Anion gap: 9 (ref 5–15)
BUN: 14 mg/dL (ref 8–23)
CO2: 22 mmol/L (ref 22–32)
Calcium: 8.8 mg/dL — ABNORMAL LOW (ref 8.9–10.3)
Chloride: 98 mmol/L (ref 98–111)
Creatinine, Ser: 1.07 mg/dL — ABNORMAL HIGH (ref 0.44–1.00)
GFR, Estimated: 49 mL/min — ABNORMAL LOW (ref >60)
Glucose, Bld: 98 mg/dL (ref 70–99)
Potassium: 4.4 mmol/L (ref 3.5–5.1)
Sodium: 129 mmol/L — ABNORMAL LOW (ref 135–145)

## 2022-01-06 MED ORDER — ENSURE ENLIVE PO LIQD
237.0000 mL | Freq: Three times a day (TID) | ORAL | Status: DC
  Administered 2022-01-06: 237 mL via ORAL

## 2022-01-06 MED ORDER — PROSOURCE PLUS PO LIQD
30.0000 mL | Freq: Two times a day (BID) | ORAL | Status: DC
Start: 2022-01-06 — End: 2022-01-07
  Administered 2022-01-06 – 2022-01-07 (×3): 30 mL via ORAL
  Filled 2022-01-06 (×3): qty 30

## 2022-01-06 NOTE — Evaluation (Signed)
Occupational Therapy Evaluation Patient Details Name: Caitlin Wilson MRN: [id number] DOB: 1927/08/07 Today's Date: 01/06/2022   History of Present Illness Pt is a 88 y/o female admitted secondary to increased weakness. Found to have UTI. PMH includes a fib, HTN, PE, and osteoporosis.   Clinical Impression   Pt walked without AD in her home and was completing ADLs independently. She has a caregiver and supportive family who help with IADLs. Pt presents with generalized weakness and impaired standing balance. She overall requires min guard assist with RW for ambulation and set up to min assist for ADLs. Pt likely to progress well as medical issues resolve. Recommend pt's family stay with her until she has returned to her baseline. Will follow acutely.      Recommendations for follow up therapy are one component of a multi-disciplinary discharge planning process, led by the attending physician.  Recommendations may be updated based on patient status, additional functional criteria and insurance authorization.   Follow Up Recommendations  No OT follow up    Assistance Recommended at Discharge Frequent or constant Supervision/Assistance (initially)  Patient can return home with the following A little help with walking and/or transfers;A little help with bathing/dressing/bathroom;Assistance with cooking/housework;Assist for transportation;Help with stairs or ramp for entrance    Functional Status Assessment  Patient has had a recent decline in their functional status and demonstrates the ability to make significant improvements in function in a reasonable and predictable amount of time.  Equipment Recommendations  None recommended by OT    Recommendations for Other Services       Precautions / Restrictions Precautions Precautions: Fall Restrictions Weight Bearing Restrictions: No      Mobility Bed Mobility               General bed mobility comments: pt in chair     Transfers Overall transfer level: Needs assistance Equipment used: Rolling walker (2 wheels) Transfers: Sit to/from Stand Sit to Stand: Min guard           General transfer comment: from recliner and toilet      Balance Overall balance assessment: Needs assistance   Sitting balance-Leahy Scale: Good       Standing balance-Leahy Scale: Fair Standing balance comment: can release walker in standing for pericare and to manage undergarment                           ADL either performed or assessed with clinical judgement   ADL Overall ADL's : Needs assistance/impaired Eating/Feeding: Independent;Sitting   Grooming: Set up;Wash/dry hands;Wash/dry face;Oral care;Sitting   Upper Body Bathing: Minimal assistance;Sitting Upper Body Bathing Details (indicate cue type and reason): assisted to wash back Lower Body Bathing: Min guard;Sit to/from stand   Upper Body Dressing : Set up;Sitting   Lower Body Dressing: Min guard;Sit to/from stand   Toilet Transfer: Min guard;Ambulation;Regular Toilet;Rolling walker (2 wheels);Grab bars   Toileting- Clothing Manipulation and Hygiene: Min guard;Sit to/from stand       Functional mobility during ADLs: Min guard;Rolling walker (2 wheels)       Vision Patient Visual Report: No change from baseline Additional Comments: per son, pt with low vision at baseline     Perception     Praxis      Pertinent Vitals/Pain Pain Assessment Pain Assessment: No/denies pain     Hand Dominance Right   Extremity/Trunk Assessment     Lower Extremity Assessment Lower Extremity Assessment: Overall WFL for tasks  assessed   Cervical / Trunk Assessment Cervical / Trunk Assessment: Kyphotic   Communication Communication Communication: HOH   Cognition Arousal/Alertness: Awake/alert Behavior During Therapy: WFL for tasks assessed/performed Overall Cognitive Status: Within Functional Limits for tasks assessed                                        General Comments  HR 70 bpm, SpO2 94-96% on RA; BP map >65 prior to standing and no dizziness reported    Exercises     Shoulder Instructions      Home Living Family/patient expects to be discharged to:: Private residence Living Arrangements: Alone Available Help at Discharge: Family;Available 24 hours/day;Personal care attendant Type of Home: Apartment Home Access: Elevator     Home Layout: One level     Bathroom Shower/Tub: Tub/shower unit;Walk-in shower   Bathroom Toilet: Handicapped height     Home Equipment: Conservation officer, nature (2 wheels)          Prior Functioning/Environment Prior Level of Function : Needs assist             Mobility Comments: Normally independent, but has been using RW within the last couple days before admission. ADLs Comments: assisted for some IADLs        OT Problem List: Impaired balance (sitting and/or standing);Decreased strength      OT Treatment/Interventions: Self-care/ADL training;DME and/or AE instruction;Therapeutic activities;Patient/family education;Balance training    OT Goals(Current goals can be found in the care plan section) Acute Rehab OT Goals OT Goal Formulation: With patient Time For Goal Achievement: 01/20/22 Potential to Achieve Goals: Good ADL Goals Pt Will Perform Grooming: with modified independence;standing Pt Will Perform Lower Body Bathing: with modified independence;sit to/from stand Pt Will Perform Lower Body Dressing: with modified independence;sit to/from stand Pt Will Transfer to Toilet: with modified independence;ambulating;regular height toilet Pt Will Perform Toileting - Clothing Manipulation and hygiene: with modified independence;sit to/from stand  OT Frequency: Min 2X/week    Co-evaluation              AM-PAC OT "6 Clicks" Daily Activity     Outcome Measure Help from another person eating meals?: None Help from another person taking care of personal  grooming?: A Little Help from another person toileting, which includes using toliet, bedpan, or urinal?: A Little Help from another person bathing (including washing, rinsing, drying)?: None Help from another person to put on and taking off regular upper body clothing?: A Little   6 Click Score: 17   End of Session Equipment Utilized During Treatment: Rolling walker (2 wheels) Nurse Communication: Other (comment) (had bath, needs purewick replaced)  Activity Tolerance: Patient tolerated treatment well Patient left: in chair;with call bell/phone within reach;with chair alarm set;with family/visitor present  OT Visit Diagnosis: Unsteadiness on feet (R26.81);Other abnormalities of gait and mobility (R26.89);Muscle weakness (generalized) (M62.81)                Time: 7121-9758 OT Time Calculation (min): 61 min Charges:  OT General Charges $OT Visit: 1 Visit OT Evaluation $OT Eval Moderate Complexity: 1 Mod OT Treatments $Self Care/Home Management : 38-52 mins  [email or web address]  Malka So 01/06/2022, 3:27 PM

## 2022-01-06 NOTE — Progress Notes (Signed)
Physical Therapy Treatment Patient Details Name: Caitlin Wilson MRN: [id number] DOB: [birth year] Today's Date: 01/06/2022   History of Present Illness Pt is a 88 y/o female admitted secondary to increased weakness. Found to have UTI. PMH includes a fib, HTN, PE, and osteoporosis.    PT Comments    Pt received in supine, pleasantly cooperative and with good effort and tolerance for gait trial with rollator and transfer training. Pt needs reminders for safe use of rollator (locking brakes prior to sitting/standing) but fair safety overall with rollator and no overt LOB for longer household distance gait trial in hallway. Pt reports 5/10 modified RPE (fatigue) at end of session. Pt up to recliner for lunch and motivated to work with OT on ADLs later in day. Pt continues to benefit from PT services to progress toward functional mobility goals.  DME recommendations updated below per discussion with supervising PT [place] G.  Recommendations for follow up therapy are one component of a multi-disciplinary discharge planning process, led by the attending physician.  Recommendations may be updated based on patient status, additional functional criteria and insurance authorization.  Follow Up Recommendations  Home health PT     Assistance Recommended at Discharge Frequent or constant Supervision/Assistance (initially)  Patient can return home with the following A little help with bathing/dressing/bathroom;Assistance with cooking/housework;Help with stairs or ramp for entrance;Assist for transportation   Equipment Recommendations  Rollator (4 wheels)    Recommendations for Other Services       Precautions / Restrictions Precautions Precautions: Fall Restrictions Weight Bearing Restrictions: No     Mobility  Bed Mobility Overal bed mobility: Needs Assistance Bed Mobility: Supine to Sit     Supine to sit: Min guard     General bed mobility comments: pt using bed features and railings,  cues for technique    Transfers Overall transfer level: Needs assistance Equipment used: Rollator (4 wheels) Transfers: Sit to/from Stand Sit to Stand: Min guard           General transfer comment: from EOB and to recliner, cues for locking rollator brakes and safer technique    Ambulation/Gait Ambulation/Gait assistance: Supervision Gait Distance (Feet): 250 Feet Assistive device: Rollator (4 wheels) Gait Pattern/deviations: Step-through pattern, Decreased stride length Gait velocity: variable; grossly 0.2-0.5 m/s     General Gait Details: cues for proximity to rollator, use of brakes, activity pacing; no overt LOB, fair speed/cadence    Balance Overall balance assessment: Needs assistance Sitting-balance support: No upper extremity supported, Feet supported Sitting balance-Leahy Scale: Good     Standing balance support: Bilateral upper extremity supported, During functional activity Standing balance-Leahy Scale: Fair Standing balance comment: fair balance with rollator; did not assess without                            Cognition Arousal/Alertness: Awake/alert Behavior During Therapy: WFL for tasks assessed/performed Overall Cognitive Status: No family/caregiver present to determine baseline cognitive functioning                                 General Comments: needs reminders for how to lock rollator, it was a new device for her so may need continued teaching.        Exercises Other Exercises Other Exercises: seated BLE AROM: hip flexion, LAQ x5-10 reps ea, encouraged her to perform at least 3 sets per day    General Comments  General comments (skin integrity, edema, etc.): HR 70 bpm, SpO2 94-96% on RA; BP map >65 prior to standing and no dizziness reported      Pertinent Vitals/Pain Pain Assessment Pain Assessment: No/denies pain           PT Goals (current goals can now be found in the care plan section) Acute Rehab PT  Goals Patient Stated Goal: to go home PT Goal Formulation: With patient Time For Goal Achievement: 01/19/22 Progress towards PT goals: Progressing toward goals    Frequency    Min 3X/week      PT Plan Current plan remains appropriate;Equipment recommendations need to be updated       AM-PAC PT "6 Clicks" Mobility   Outcome Measure  Help needed turning from your back to your side while in a flat bed without using bedrails?: None Help needed moving from lying on your back to sitting on the side of a flat bed without using bedrails?: A Little Help needed moving to and from a bed to a chair (including a wheelchair)?: A Little Help needed standing up from a chair using your arms (e.g., wheelchair or bedside chair)?: A Little Help needed to walk in hospital room?: A Little Help needed climbing 3-5 steps with a railing? : A Lot 6 Click Score: 18    End of Session Equipment Utilized During Treatment: Gait belt Activity Tolerance: Patient tolerated treatment well Patient left: with call bell/phone within reach;in chair;with chair alarm set Nurse Communication: Mobility status;Other (comment) (pt requesting OT assist her to bathe after she eats lunch) PT Visit Diagnosis: Unsteadiness on feet (R26.81);Muscle weakness (generalized) (M62.81);Difficulty in walking, not elsewhere classified (R26.2)     Time: 1201-1239 PT Time Calculation (min) (ACUTE ONLY): 38 min  Charges:  $Gait Training: 23-37 mins $Therapeutic Activity: 8-22 mins                     Caitlin Wilson P., PTA Acute Rehabilitation Services Secure Chat Preferred 9a-5:30pm Office: [place] 01/06/2022, 2:32 PM

## 2022-01-06 NOTE — TOC Initial Note (Signed)
Transition of Care [facility]) - Initial/Assessment Note    Patient Details  Name: Caitlin Wilson MRN: [id number] Date of Birth: 1927/07/25  Transition of [facility]) CM/SW Contact:    Cyndi Bender, RN Phone Number: 01/06/2022, 3:29 PM  Clinical Narrative:                 Spoke to patient and son by phone regarding transition needs. Paitent lives alone in an apartment. Family is available to transport to apts and check on patient. Patient has a lady that helps her named Kathlee Nations. Patient is agreeable to home health and dme. Patient defers to [facility] to find highly rated home health agency. [place] with [facility] accepted referral. Patient agreeable to use in house provider adapt for dme. Spoke to [place] and rollator will be delivered to room prior to discharge.  TOC will continue to follow for needs.    Need home health PT and aide order  Expected Discharge Plan: [place] Barriers to Discharge: Continued Medical Work up   Patient Goals and CMS Choice Patient states their goals for this hospitalization and ongoing recovery are:: return home CMS Medicare.gov Compare Post Acute Care list provided to:: Patient Choice offered to / list presented to : Patient  Expected Discharge Plan and Services Expected Discharge Plan: [place]   Discharge Planning Services: CM Consult Post Acute Care Choice: Home Health, Durable Medical Equipment Living arrangements for the past 2 months: Apartment                 DME Arranged: Walker rolling with seat DME Agency: [employer] Date DME Agency Contacted: 01/06/22 Time DME Agency Contacted: [phone] Representative spoke with at DME Agency: [place]: PT, Nurse's Aide [place] Agency: [place] Date [place]: 01/06/22 Time [place]: 1417 Representative spoke with at [place]: Malachy Mood  Prior Living Arrangements/Services Living arrangements for the past 2 months:  [place] with:: Self Patient language and need for interpreter reviewed:: Yes Do you feel safe going back to the place where you live?: Yes      Need for Family Participation in Patient Care: Yes (Comment) Care giver support system in place?: Yes (comment) Current home services: DME Criminal Activity/Legal Involvement Pertinent to Current Situation/Hospitalization: No - Comment as needed  Activities of Daily Living      Permission Sought/Granted Permission sought to share information with : Investment banker, corporate granted to share info w AGENCY: [facility]        Emotional Assessment   Attitude/Demeanor/Rapport: Engaged Affect (typically observed): Accepting Orientation: : Oriented to Self, Oriented to Place, Oriented to  Time, Oriented to Situation Alcohol / Substance Use: Not Applicable Psych Involvement: No (comment)  Admission diagnosis:  Lower urinary tract infectious disease [N39.0] UTI (urinary tract infection) [N39.0] Weakness [R53.1] Patient Active Problem List   Diagnosis Date Noted   Essential hypertension 01/04/2022   PAF (paroxysmal atrial fibrillation) ([place]) 01/04/2022   GERD (gastroesophageal reflux disease) 01/04/2022   Hyperlipidemia 01/04/2022   Osteoporosis 01/04/2022   Hyponatremia 01/04/2022   AKI (acute kidney injury) ([place]) 01/04/2022   Normocytic anemia 01/04/2022   UTI (urinary tract infection) 01/04/2022   Hyperkalemia 34/74/2595   Helicobacter pylori gastritis 04/02/2020   PCP:  [name], MD Pharmacy:   [facility] [address] [address] Phone: [phone] Fax: [phone]  [facility]  [address] [place] [address] Phone: [phone] Fax: [phone]     Social Determinants of Health (SDOH) Interventions    Readmission Risk Interventions     No data to display

## 2022-01-06 NOTE — Progress Notes (Signed)
  Transition of Care [facility]) Screening Note   Patient Details  Name: Caitlin Wilson Date of Birth: 04/03/1928   Transition of Care [facility]) CM/SW Contact:    Cyndi Bender, RN Phone Number: 01/06/2022, 8:10 AM    Transition of Care Department [facility]) has reviewed patient and no TOC needs have been identified at this time. We will continue to monitor patient advancement through interdisciplinary progression rounds. If new patient transition needs arise, please place a TOC consult.

## 2022-01-06 NOTE — Progress Notes (Signed)
PROGRESS NOTE    Caitlin Wilson  BJY:[id number] DOB: 1927/08/21 DOA: 01/04/2022 PCP: [name], MD   Brief Narrative: Caitlin Wilson is a 88 y.o. female with history of atrial fibrillation, osteoarthritis, hyperlipidemia, primary hypertension, remote PE, osteoporosis. Patient presented secondary to weakness and confusion with concern for UTI and dehydration. Patient was started empirically on Ceftriaxone IV, IV fluids and urine cultures obtained.   Assessment and Plan:  Generalized weakness Concern this is secondary to UTI. Patient with anemia as well, but unsure if this is acute or chronic. Patient is also on metoprolol, but this is a long term medication and patient today reports no associated fatigue. Patient lives at home alone. -PT/OT eval  UTI Continue with IV Rocephin -Follow on urine cultures  Hyponatremia Encouraged to increase her oral intake, will DC hypertonic saline D5 half NS.  Possible AKI Unknown true baseline. Creatinine from two years prior of 1.32 with associated hyperkalemia, suggesting possible AKI at that time. Patient with a creatinine of 1.31 with potassium of 5.2 on admission. Patient is on olmesartan and ibuprofen PRN as an outpatient. IV fluids initiated with worsened creatinine to a peak of 1.42 overnight. Creatinine down to 1.19 this morning.   Colonic wall thickening Involving descending/sigmoid colon. Clinically, unlikely infection however imaging suspicious for colitis. Patient with a history of diverticulosis but does not have symptoms of diverticulitis at this time. Patient will likely need GI follow-up as an outpatient.  Primary hypertension Patient is managed on olmesartan and metoprolol tartrate as an outpatient. Currently mostly normocytic. Olmesartan on hold secondary to possible AKI -Metoprolol succinate 50 mg  Normocytic anemia No history/evidence of GI bleeding. History of Helicobacter pylori gastritis, treated. -CBC in  AM  GERD -Continue Protonix  Hyperlipidemia -Continue Lovastatin (substituted with Pravastatin while inpatient)  History of atrial fibrillation -Currently in sinus rhythm  LBBB -2D echo with no acute findings, she denies any chest pain or shortness of breath -Gust with her primary cardiologist Dr. Sabra Heck in [place], apparently left bundle branch block presents since 2018    DVT prophylaxis: Lovenox Code Status:   Code Status: Full Code Family Communication: Granddaughter on telephone Disposition Plan: Home in 1 to 2 days   Consultants:  None  Procedures:  None  Antimicrobials: Ceftriaxone 1 g IV    Subjective:  No significant events overnight as discussed with staff, she denies any complaints, no BM over last 2 days, but she just had one this afternoon.  Objective: BP (!) 118/48 (BP Location: Right Arm)   Pulse 68   Temp 98.4 F (36.9 C) (Oral)   Resp 20   Ht '4\' 11"'$  (1.499 m)   Wt 63.5 kg Comment: from 2022 records, please update  SpO2 94%   BMI 28.28 kg/m   Examination:  Awake Alert, Oriented X 3, No new F.N deficits, frail, hard of hearing, wearing hearing aid Symmetrical Chest wall movement, Good air movement bilaterally, CTAB RRR,No Gallops,Rubs or new Murmurs, No Parasternal Heave +ve B.Sounds, Abd Soft, No tenderness, No rebound - guarding or rigidity. No Cyanosis, Clubbing or edema, No new Rash or bruise      Data Reviewed: I have personally reviewed following labs and imaging studies  CBC Lab Results  Component Value Date   WBC 8.8 01/06/2022   RBC 3.32 (L) 01/06/2022   HGB 9.8 (L) 01/06/2022   HCT 29.2 (L) 01/06/2022   MCV 88.0 01/06/2022   MCH 29.5 01/06/2022   PLT 398 01/06/2022   MCHC 33.6  01/06/2022   RDW 14.2 01/06/2022   LYMPHSABS 1.7 01/04/2022   MONOABS 1.0 01/04/2022   EOSABS 0.1 01/04/2022   BASOSABS 0.0 79/89/2119     Last metabolic panel Lab Results  Component Value Date   NA 129 (L) 01/06/2022   K 4.4  01/06/2022   CL 98 01/06/2022   CO2 22 01/06/2022   BUN 14 01/06/2022   CREATININE 1.07 (H) 01/06/2022   GLUCOSE 98 01/06/2022   GFRNONAA 49 (L) 01/06/2022   CALCIUM 8.8 (L) 01/06/2022   PROT 5.9 (L) 01/05/2022   ALBUMIN 2.8 (L) 01/05/2022   BILITOT 0.5 01/05/2022   ALKPHOS 79 01/05/2022   AST 16 01/05/2022   ALT 10 01/05/2022   ANIONGAP 9 01/06/2022    GFR: Estimated Creatinine Clearance: 27.7 mL/min (A) (by C-G formula based on SCr of 1.07 mg/dL (H)).  Recent Results (from the past 240 hour(s))  SARS Coronavirus 2 by RT PCR (hospital order, performed in [facility] hospital lab) *cepheid single result test* Anterior Nasal Swab     Status: None   Collection Time: 01/04/22  4:20 PM   Specimen: Anterior Nasal Swab  Result Value Ref Range Status   SARS Coronavirus 2 by RT PCR NEGATIVE NEGATIVE Final    Comment: (NOTE) SARS-CoV-2 target nucleic acids are NOT DETECTED.  The SARS-CoV-2 RNA is generally detectable in upper and lower respiratory specimens during the acute phase of infection. The lowest concentration of SARS-CoV-2 viral copies this assay can detect is 250 copies / mL. A negative result does not preclude SARS-CoV-2 infection and should not be used as the sole basis for treatment or other patient management decisions.  A negative result may occur with improper specimen collection / handling, submission of specimen other than nasopharyngeal swab, presence of viral mutation(s) within the areas targeted by this assay, and inadequate number of viral copies (<250 copies / mL). A negative result must be combined with clinical observations, patient history, and epidemiological information.  Fact Sheet for Patients:   https://www.patel.info/  Fact Sheet for Healthcare Providers: https://hall.com/  This test is not yet approved or  cleared by the [place] FDA and has been authorized for detection and/or diagnosis of  SARS-CoV-2 by FDA under an Emergency Use Authorization (EUA).  This EUA will remain in effect (meaning this test can be used) for the duration of the COVID-19 declaration under Section 564(b)(1) of the Act, 21 U.S.C. section 360bbb-3(b)(1), unless the authorization is terminated or revoked sooner.  Performed at [facility], [place] [address] [place], [place] [address]       Radiology Studies: ECHOCARDIOGRAM COMPLETE  Result Date: 01/05/2022    ECHOCARDIOGRAM REPORT   Patient Name:   SHAWNY BORKOWSKI Date of Exam: 01/05/2022 Medical Rec #:  [id number]        Height:       59.0 in Accession #:    [id number]       Weight:       140.0 lb Date of Birth:  05-17-1927        BSA:          1.585 m Patient Age:    28 years         BP:           148/76 mmHg Patient Gender: F                HR:           87 bpm. Exam Location:  Inpatient  Procedure: 2D Echo and Intracardiac Opacification Agent Indications:    Heart Block, LBBB  History:        Patient has no prior history of Echocardiogram examinations.                 Arrythmias:Atrial Fibrillation and LBBB; Risk                 Factors:Hypertension and Dyslipidemia.  Sonographer:    Harvie Junior Referring Phys: [place]  1. Left ventricular ejection fraction, by estimation, is 50 to 55%. The left ventricle has low normal function. The left ventricle has no regional wall motion abnormalities. Left ventricular diastolic parameters are consistent with Grade I diastolic dysfunction (impaired relaxation).  2. Right ventricular systolic function is normal. The right ventricular size is normal. There is normal pulmonary artery systolic pressure. The estimated right ventricular systolic pressure is 67.8 mmHg.  3. Left atrial size was moderately dilated.  4. The mitral valve is normal in structure. Trivial mitral valve regurgitation. No evidence of mitral stenosis.  5. The aortic valve is tricuspid. There is mild calcification of the aortic  valve. There is mild thickening of the aortic valve. Aortic valve regurgitation is not visualized. Aortic valve sclerosis is present, with no evidence of aortic valve stenosis.  6. The inferior vena cava is normal in size with greater than 50% respiratory variability, suggesting right atrial pressure of 3 mmHg. FINDINGS  Left Ventricle: Left ventricular ejection fraction, by estimation, is 50 to 55%. The left ventricle has low normal function. The left ventricle has no regional wall motion abnormalities. Definity contrast agent was given IV to delineate the left ventricular endocardial borders. The left ventricular internal cavity size was normal in size. There is no left ventricular hypertrophy. Abnormal (paradoxical) septal motion, consistent with left bundle branch block. Left ventricular diastolic parameters  are consistent with Grade I diastolic dysfunction (impaired relaxation). Indeterminate filling pressures. Right Ventricle: The right ventricular size is normal. No increase in right ventricular wall thickness. Right ventricular systolic function is normal. There is normal pulmonary artery systolic pressure. The tricuspid regurgitant velocity is 2.51 m/s, and  with an assumed right atrial pressure of 3 mmHg, the estimated right ventricular systolic pressure is 93.8 mmHg. Left Atrium: Left atrial size was moderately dilated. Right Atrium: Right atrial size was normal in size. Pericardium: There is no evidence of pericardial effusion. Mitral Valve: The mitral valve is normal in structure. There is mild thickening of the mitral valve leaflet(s). Mild mitral annular calcification. Trivial mitral valve regurgitation. No evidence of mitral valve stenosis. Tricuspid Valve: The tricuspid valve is grossly normal. Tricuspid valve regurgitation is trivial. Aortic Valve: The aortic valve is tricuspid. There is mild calcification of the aortic valve. There is mild thickening of the aortic valve. Aortic valve  regurgitation is not visualized. Aortic valve sclerosis is present, with no evidence of aortic valve stenosis. Aortic valve mean gradient measures 6.0 mmHg. Aortic valve peak gradient measures 9.1 mmHg. Aortic valve area, by VTI measures 3.26 cm. Pulmonic Valve: The pulmonic valve was grossly normal. Pulmonic valve regurgitation is trivial. Aorta: The aortic root and ascending aorta are structurally normal, with no evidence of dilitation. Venous: The inferior vena cava is normal in size with greater than 50% respiratory variability, suggesting right atrial pressure of 3 mmHg. IAS/Shunts: The interatrial septum is aneurysmal. There is right bowing of the interatrial septum, suggestive of elevated left atrial pressure. No atrial level shunt detected by color flow  Doppler.  LEFT VENTRICLE PLAX 2D LVIDd:         3.80 cm     Diastology LVIDs:         2.80 cm     LV e' medial:    6.96 cm/s LV PW:         1.00 cm     LV E/e' medial:  12.3 LV IVS:        1.00 cm     LV e' lateral:   7.40 cm/s LVOT diam:     2.30 cm     LV E/e' lateral: 11.6 LV SV:         96 LV SV Index:   60 LVOT Area:     4.15 cm  LV Volumes (MOD) LV vol d, MOD A2C: 82.7 ml LV vol d, MOD A4C: 86.5 ml LV vol s, MOD A2C: 40.3 ml LV vol s, MOD A4C: 38.7 ml LV SV MOD A2C:     42.4 ml LV SV MOD A4C:     37.8 ml LV SV MOD BP:      46.2 ml RIGHT VENTRICLE RV Basal diam:  3.40 cm RV Mid diam:    3.50 cm RV S prime:     17.90 cm/s TAPSE (M-mode): 2.4 cm LEFT ATRIUM             Index        RIGHT ATRIUM           Index LA diam:        3.80 cm 2.40 cm/m   RA Area:     10.30 cm LA Vol (A2C):   55.1 ml 34.76 ml/m  RA Volume:   20.80 ml  13.12 ml/m LA Vol (A4C):   59.5 ml 37.54 ml/m LA Biplane Vol: 57.3 ml 36.15 ml/m  AORTIC VALVE                     PULMONIC VALVE AV Area (Vmax):    3.25 cm      PV Vmax:          1.28 m/s AV Area (Vmean):   3.15 cm      PV Peak grad:     6.6 mmHg AV Area (VTI):     3.26 cm      PR End Diast Vel: 2.92 msec AV Vmax:            151.00 cm/s AV Vmean:          113.000 cm/s AV VTI:            0.293 m AV Peak Grad:      9.1 mmHg AV Mean Grad:      6.0 mmHg LVOT Vmax:         118.00 cm/s LVOT Vmean:        85.600 cm/s LVOT VTI:          0.230 m LVOT/AV VTI ratio: 0.78  AORTA Ao Root diam: 3.40 cm Ao Asc diam:  2.50 cm MITRAL VALVE                TRICUSPID VALVE MV Area (PHT): 2.70 cm     TR Peak grad:   25.2 mmHg MV Decel Time: 281 msec     TR Vmax:        251.00 cm/s MR Peak grad: 36.1 mmHg MR Vmax:      300.50 cm/s   SHUNTS MV E velocity: 85.70 cm/s  Systemic VTI:  0.23 m MV A velocity: 114.00 cm/s  Systemic Diam: 2.30 cm MV E/A ratio:  0.75 Mihai Croitoru MD Electronically signed by Sanda Klein MD Signature Date/Time: 01/05/2022/4:48:37 PM    Final    CT ABDOMEN PELVIS W CONTRAST  Result Date: 01/04/2022 CLINICAL DATA:  Acute abdominal pain.  Weakness. EXAM: CT ABDOMEN AND PELVIS WITH CONTRAST TECHNIQUE: Multidetector CT imaging of the abdomen and pelvis was performed using the standard protocol following bolus administration of intravenous contrast. RADIATION DOSE REDUCTION: This exam was performed according to the departmental dose-optimization program which includes automated exposure control, adjustment of the mA and/or kV according to patient size and/or use of iterative reconstruction technique. CONTRAST:  34m OMNIPAQUE IOHEXOL 300 MG/ML  SOLN COMPARISON:  None Available. FINDINGS: Lower chest: No acute airspace disease or pleural effusion lesion. Heart is normal in size with coronary artery calcifications. There is a hiatal hernia. Hepatobiliary: No focal liver abnormality is seen. No gallstones, gallbladder wall thickening, or biliary dilatation. Pancreas: Age related parenchymal atrophy. No ductal dilatation or inflammation. Spleen: Normal in size without focal abnormality. Adrenals/Urinary Tract: Normal adrenal glands. Extrarenal pelvis configuration, right greater than left. No hydronephrosis. No perinephric edema. No  suspicious renal lesion. Unremarkable urinary bladder. Stomach/Bowel: Small to moderate hiatal hernia. Stomach is decompressed. There is no small bowel obstruction or inflammation. The appendix is not visualized, appendectomy per history small to moderate volume of colonic stool. There is diverticulosis from the splenic flexure distally. There is equivocal colonic wall thickening involving the junction of the descending and sigmoid colon, series 6, image 48 with mild adjacent mesenteric stranding. Vascular/Lymphatic: Moderate aortic atherosclerosis. Aortic tortuosity. No aneurysm. Patent portal vein. No enlarged lymph nodes in the abdomen or pelvis. Reproductive: Status post hysterectomy. No adnexal masses. Other: No free air or ascites. No abdominopelvic collection. Minimal fat in the inguinal canals. Musculoskeletal: Left hip arthroplasty. L3 compression fracture with vertebral plasty. Scoliosis with diffuse degenerative change in the spine. No acute osseous abnormalities are seen. IMPRESSION: 1. Equivocal short segment colonic wall thickening involving the junction of the descending and sigmoid colon with mild adjacent mesenteric stranding, suspicious for colitis. Recommend direct visualization with colonoscopy to exclude the possibility of underlying colonic neoplasm. 2. Left colonic diverticulosis without focal diverticulitis. 3. Small to moderate hiatal hernia. Aortic Atherosclerosis (ICD10-I70.0). Electronically Signed   By: MKeith RakeM.D.   On: 01/04/2022 17:46   DG Chest 2 View  Result Date: 01/04/2022 CLINICAL DATA:  Per ED triage notes: "Patient here with daughter who reports prior to a week and a half ago patient was independent and able to care for herself, now patient is feeling globally weak, not able to ambulate independently, endorses chills, nausea, with emesis, dry heaving,abdominal pain and SHOB. Denies chest pain. Denies any recent falls." EXAM: CHEST - 2 VIEW COMPARISON:  None.  FINDINGS: Cardiac silhouette borderline enlarged. No mediastinal or hilar masses. No evidence of adenopathy. Mild pleuroparenchymal scarring at the apices. Mild linear scarring or atelectasis at the left lung base. Lungs are hyperexpanded, otherwise clear. No pleural effusion or pneumothorax. Severe compression fracture of a midthoracic vertebra, likely chronic but of unclear chronicity. No other fractures. Skeletal structures are demineralized. IMPRESSION: 1. No acute cardiopulmonary disease. 2. Severe compression fracture of a midthoracic vertebra of unclear chronicity. Electronically Signed   By: DLajean ManesM.D.   On: 01/04/2022 15:24      LOS: 2 days    DPhillips Climes, MD [facility] 01/06/2022, 2:26 PM  If 7PM-7AM, please contact night-coverage [email or web address]

## 2022-01-06 NOTE — Progress Notes (Signed)
Formatting of this note is different from the original.  Images from the original note were not included.      PROGRESS NOTE    Mary Woodward  ZOX:[id number] DOB: October 02, 1927 DOA: 01/04/2022  PCP: Alinda Deem, MD    Brief Narrative:  Mary Woodward is a 88 y.o. female with history of atrial fibrillation, osteoarthritis, hyperlipidemia, primary hypertension, remote PE, osteoporosis. Patient presented secondary to weakness and confusion with concern for UTI and dehydration. Patient was started empirically on Ceftriaxone IV, IV fluids and urine cultures obtained.    Assessment and Plan:    Generalized weakness  Concern this is secondary to UTI. Patient with anemia as well, but unsure if this is acute or chronic. Patient is also on metoprolol, but this is a long term medication and patient today reports no associated fatigue. Patient lives at home alone.  -PT/OT eval    UTI  Continue with IV Rocephin  -Follow on urine cultures    Hyponatremia  Encouraged to increase her oral intake, will DC hypertonic saline D5 half NS.    Possible AKI  Unknown true baseline. Creatinine from two years prior of 1.32 with associated hyperkalemia, suggesting possible AKI at that time. Patient with a creatinine of 1.31 with potassium of 5.2 on admission. Patient is on olmesartan and ibuprofen PRN as an outpatient. IV fluids initiated with worsened creatinine to a peak of 1.42 overnight. Creatinine down to 1.19 this morning.    Colonic wall thickening  Involving descending/sigmoid colon. Clinically, unlikely infection however imaging suspicious for colitis. Patient with a history of diverticulosis but does not have symptoms of diverticulitis at this time. Patient will likely need GI follow-up as an outpatient.    Primary hypertension  Patient is managed on olmesartan and metoprolol tartrate as an outpatient. Currently mostly normocytic. Olmesartan on hold secondary to possible AKI  -Metoprolol succinate 50 mg    Normocytic anemia  No  history/evidence of GI bleeding. History of Helicobacter pylori gastritis, treated.  -CBC in AM    GERD  -Continue Protonix    Hyperlipidemia  -Continue Lovastatin (substituted with Pravastatin while inpatient)    History of atrial fibrillation  -Currently in sinus rhythm    LBBB  -2D echo with no acute findings, she denies any chest pain or shortness of breath  -Gust with her primary cardiologist Dr. Hyacinth Meeker in [place], apparently left bundle branch block presents since 2018    DVT prophylaxis: Lovenox  Code Status:   Code Status: Full Code  Family Communication: Granddaughter on telephone  Disposition Plan: Home in 1 to 2 days    Consultants:   None    Procedures:   None    Antimicrobials:  Ceftriaxone 1 g IV     Subjective:    No significant events overnight as discussed with staff, she denies any complaints, no BM over last 2 days, but she just had one this afternoon.    Objective:  BP (!) 118/48 (BP Location: Right Arm)   Pulse 68   Temp 98.4 F (36.9 C) (Oral)   Resp 20   Ht 4\' 11"  (1.499 m)   Wt 63.5 kg Comment: from 2022 records, please update  SpO2 94%   BMI 28.28 kg/m     Examination:    Awake Alert, Oriented X 3, No new F.N deficits, frail, hard of hearing, wearing hearing aid  Symmetrical Chest wall movement, Good air movement bilaterally, CTAB  RRR,No Gallops,Rubs or new Murmurs, No Parasternal Heave  +ve  B.Sounds, Abd Soft, No tenderness, No rebound - guarding or rigidity.  No Cyanosis, Clubbing or edema, No new Rash or bruise      Data Reviewed: I have personally reviewed following labs and imaging studies    CBC  Lab Results   Component Value Date    WBC 8.8 01/06/2022    RBC 3.32 (L) 01/06/2022    HGB 9.8 (L) 01/06/2022    HCT 29.2 (L) 01/06/2022    MCV 88.0 01/06/2022    MCH 29.5 01/06/2022    PLT 398 01/06/2022    MCHC 33.6 01/06/2022    RDW 14.2 01/06/2022    LYMPHSABS 1.7 01/04/2022    MONOABS 1.0 01/04/2022    EOSABS 0.1 01/04/2022    BASOSABS 0.0 01/04/2022     Last metabolic  panel  Lab Results   Component Value Date    NA 129 (L) 01/06/2022    K 4.4 01/06/2022    CL 98 01/06/2022    CO2 22 01/06/2022    BUN 14 01/06/2022    CREATININE 1.07 (H) 01/06/2022    GLUCOSE 98 01/06/2022    GFRNONAA 49 (L) 01/06/2022    CALCIUM 8.8 (L) 01/06/2022    PROT 5.9 (L) 01/05/2022    ALBUMIN 2.8 (L) 01/05/2022    BILITOT 0.5 01/05/2022    ALKPHOS 79 01/05/2022    AST 16 01/05/2022    ALT 10 01/05/2022    ANIONGAP 9 01/06/2022     GFR:  Estimated Creatinine Clearance: 27.7 mL/min (A) (by C-G formula based on SCr of 1.07 mg/dL (H)).    Recent Results (from the past 240 hour(s))   SARS Coronavirus 2 by RT PCR (hospital order, performed in [facility] hospital lab) *cepheid single result test* Anterior Nasal Swab     Status: None    Collection Time: 01/04/22  4:20 PM    Specimen: Anterior Nasal Swab   Result Value Ref Range Status    SARS Coronavirus 2 by RT PCR NEGATIVE NEGATIVE Final     Comment: (NOTE)  SARS-CoV-2 target nucleic acids are NOT DETECTED.    The SARS-CoV-2 RNA is generally detectable in upper and lower  respiratory specimens during the acute phase of infection. The lowest  concentration of SARS-CoV-2 viral copies this assay can detect is 250  copies / mL. A negative result does not preclude SARS-CoV-2 infection  and should not be used as the sole basis for treatment or other  patient management decisions.  A negative result may occur with  improper specimen collection / handling, submission of specimen other  than nasopharyngeal swab, presence of viral mutation(s) within the  areas targeted by this assay, and inadequate number of viral copies  (<250 copies / mL). A negative result must be combined with clinical  observations, patient history, and epidemiological information.    Fact Sheet for Patients:    RoadLapTop.co.za    Fact Sheet for Healthcare Providers:  http://kim-miller.com/    This test is not yet approved or  cleared by the [place] FDA and  has been authorized for detection and/or diagnosis of SARS-CoV-2 by  FDA under an Emergency Use Authorization (EUA).  This EUA will remain  in effect (meaning this test can be used) for the duration of the  COVID-19 declaration under Section 564(b)(1) of the Act, 21 U.S.C.  section 360bbb-3(b)(1), unless the authorization is terminated or  revoked sooner.    Performed at [facility], [address], [place], [place]  [address]        Radiology Studies:  ECHOCARDIOGRAM COMPLETE    Result Date: 01/05/2022     ECHOCARDIOGRAM REPORT   Patient Name:   KEIONDRA TISH Date of Exam: 01/05/2022 Medical Rec #:  [id number]        Height:       59.0 in Accession #:    [id number]       Weight:       140.0 lb Date of Birth:  [birth year]        BSA:          1.585 m Patient Age:    89 years         BP:           148/76 mmHg Patient Gender: F                HR:           87 bpm. Exam Location:  Inpatient Procedure: 2D Echo and Intracardiac Opacification Agent Indications:    Heart Block, LBBB  History:        Patient has no prior history of Echocardiogram examinations.                 Arrythmias:Atrial Fibrillation and LBBB; Risk                 Factors:Hypertension and Dyslipidemia.  Sonographer:    Cathie Hoops Referring Phys: [phone] RALPH A NETTEY IMPRESSIONS  1. Left ventricular ejection fraction, by estimation, is 50 to 55%. The left ventricle has low normal function. The left ventricle has no regional wall motion abnormalities. Left ventricular diastolic parameters are consistent with Grade I diastolic dysfunction (impaired relaxation).  2. Right ventricular systolic function is normal. The right ventricular size is normal. There is normal pulmonary artery systolic pressure. The estimated right ventricular systolic pressure is 28.2 mmHg.  3. Left atrial size was moderately dilated.  4. The mitral valve is normal in structure. Trivial mitral valve regurgitation. No evidence of mitral stenosis.  5. The aortic  valve is tricuspid. There is mild calcification of the aortic valve. There is mild thickening of the aortic valve. Aortic valve regurgitation is not visualized. Aortic valve sclerosis is present, with no evidence of aortic valve stenosis.  6. The inferior vena cava is normal in size with greater than 50% respiratory variability, suggesting right atrial pressure of 3 mmHg. FINDINGS  Left Ventricle: Left ventricular ejection fraction, by estimation, is 50 to 55%. The left ventricle has low normal function. The left ventricle has no regional wall motion abnormalities. Definity contrast agent was given IV to delineate the left ventricular endocardial borders. The left ventricular internal cavity size was normal in size. There is no left ventricular hypertrophy. Abnormal (paradoxical) septal motion, consistent with left bundle branch block. Left ventricular diastolic parameters  are consistent with Grade I diastolic dysfunction (impaired relaxation). Indeterminate filling pressures. Right Ventricle: The right ventricular size is normal. No increase in right ventricular wall thickness. Right ventricular systolic function is normal. There is normal pulmonary artery systolic pressure. The tricuspid regurgitant velocity is 2.51 m/s, and  with an assumed right atrial pressure of 3 mmHg, the estimated right ventricular systolic pressure is 28.2 mmHg. Left Atrium: Left atrial size was moderately dilated. Right Atrium: Right atrial size was normal in size. Pericardium: There is no evidence of pericardial effusion. Mitral Valve: The mitral valve is normal in structure. There is mild thickening of the mitral valve leaflet(s). Mild mitral  annular calcification. Trivial mitral valve regurgitation. No evidence of mitral valve stenosis. Tricuspid Valve: The tricuspid valve is grossly normal. Tricuspid valve regurgitation is trivial. Aortic Valve: The aortic valve is tricuspid. There is mild calcification of the aortic valve. There is  mild thickening of the aortic valve. Aortic valve regurgitation is not visualized. Aortic valve sclerosis is present, with no evidence of aortic valve stenosis. Aortic valve mean gradient measures 6.0 mmHg. Aortic valve peak gradient measures 9.1 mmHg. Aortic valve area, by VTI measures 3.26 cm. Pulmonic Valve: The pulmonic valve was grossly normal. Pulmonic valve regurgitation is trivial. Aorta: The aortic root and ascending aorta are structurally normal, with no evidence of dilitation. Venous: The inferior vena cava is normal in size with greater than 50% respiratory variability, suggesting right atrial pressure of 3 mmHg. IAS/Shunts: The interatrial septum is aneurysmal. There is right bowing of the interatrial septum, suggestive of elevated left atrial pressure. No atrial level shunt detected by color flow Doppler.  LEFT VENTRICLE PLAX 2D LVIDd:         3.80 cm     Diastology LVIDs:         2.80 cm     LV e' medial:    6.96 cm/s LV PW:         1.00 cm     LV E/e' medial:  12.3 LV IVS:        1.00 cm     LV e' lateral:   7.40 cm/s LVOT diam:     2.30 cm     LV E/e' lateral: 11.6 LV SV:         96 LV SV Index:   60 LVOT Area:     4.15 cm  LV Volumes (MOD) LV vol d, MOD A2C: 82.7 ml LV vol d, MOD A4C: 86.5 ml LV vol s, MOD A2C: 40.3 ml LV vol s, MOD A4C: 38.7 ml LV SV MOD A2C:     42.4 ml LV SV MOD A4C:     37.8 ml LV SV MOD BP:      46.2 ml RIGHT VENTRICLE RV Basal diam:  3.40 cm RV Mid diam:    3.50 cm RV S prime:     17.90 cm/s TAPSE (M-mode): 2.4 cm LEFT ATRIUM             Index        RIGHT ATRIUM           Index LA diam:        3.80 cm 2.40 cm/m   RA Area:     10.30 cm LA Vol (A2C):   55.1 ml 34.76 ml/m  RA Volume:   20.80 ml  13.12 ml/m LA Vol (A4C):   59.5 ml 37.54 ml/m LA Biplane Vol: 57.3 ml 36.15 ml/m  AORTIC VALVE                     PULMONIC VALVE AV Area (Vmax):    3.25 cm      PV Vmax:          1.28 m/s AV Area (Vmean):   3.15 cm      PV Peak grad:     6.6 mmHg AV Area (VTI):     3.26 cm       PR End Diast Vel: 2.92 msec AV Vmax:           151.00 cm/s AV Vmean:          113.000  cm/s AV VTI:            0.293 m AV Peak Grad:      9.1 mmHg AV Mean Grad:      6.0 mmHg LVOT Vmax:         118.00 cm/s LVOT Vmean:        85.600 cm/s LVOT VTI:          0.230 m LVOT/AV VTI ratio: 0.78  AORTA Ao Root diam: 3.40 cm Ao Asc diam:  2.50 cm MITRAL VALVE                TRICUSPID VALVE MV Area (PHT): 2.70 cm     TR Peak grad:   25.2 mmHg MV Decel Time: 281 msec     TR Vmax:        251.00 cm/s MR Peak grad: 36.1 mmHg MR Vmax:      300.50 cm/s   SHUNTS MV E velocity: 85.70 cm/s   Systemic VTI:  0.23 m MV A velocity: 114.00 cm/s  Systemic Diam: 2.30 cm MV E/A ratio:  0.75 Mihai Croitoru MD Electronically signed by Thurmon Fair MD Signature Date/Time: 01/05/2022/4:48:37 PM    Final      CT ABDOMEN PELVIS W CONTRAST    Result Date: 01/04/2022  CLINICAL DATA:  Acute abdominal pain.  Weakness. EXAM: CT ABDOMEN AND PELVIS WITH CONTRAST TECHNIQUE: Multidetector CT imaging of the abdomen and pelvis was performed using the standard protocol following bolus administration of intravenous contrast. RADIATION DOSE REDUCTION: This exam was performed according to the departmental dose-optimization program which includes automated exposure control, adjustment of the mA and/or kV according to patient size and/or use of iterative reconstruction technique. CONTRAST:  75mL OMNIPAQUE IOHEXOL 300 MG/ML  SOLN COMPARISON:  None Available. FINDINGS: Lower chest: No acute airspace disease or pleural effusion lesion. Heart is normal in size with coronary artery calcifications. There is a hiatal hernia. Hepatobiliary: No focal liver abnormality is seen. No gallstones, gallbladder wall thickening, or biliary dilatation. Pancreas: Age related parenchymal atrophy. No ductal dilatation or inflammation. Spleen: Normal in size without focal abnormality. Adrenals/Urinary Tract: Normal adrenal glands. Extrarenal pelvis configuration, right greater than left.  No hydronephrosis. No perinephric edema. No suspicious renal lesion. Unremarkable urinary bladder. Stomach/Bowel: Small to moderate hiatal hernia. Stomach is decompressed. There is no small bowel obstruction or inflammation. The appendix is not visualized, appendectomy per history small to moderate volume of colonic stool. There is diverticulosis from the splenic flexure distally. There is equivocal colonic wall thickening involving the junction of the descending and sigmoid colon, series 6, image 48 with mild adjacent mesenteric stranding. Vascular/Lymphatic: Moderate aortic atherosclerosis. Aortic tortuosity. No aneurysm. Patent portal vein. No enlarged lymph nodes in the abdomen or pelvis. Reproductive: Status post hysterectomy. No adnexal masses. Other: No free air or ascites. No abdominopelvic collection. Minimal fat in the inguinal canals. Musculoskeletal: Left hip arthroplasty. L3 compression fracture with vertebral plasty. Scoliosis with diffuse degenerative change in the spine. No acute osseous abnormalities are seen. IMPRESSION: 1. Equivocal short segment colonic wall thickening involving the junction of the descending and sigmoid colon with mild adjacent mesenteric stranding, suspicious for colitis. Recommend direct visualization with colonoscopy to exclude the possibility of underlying colonic neoplasm. 2. Left colonic diverticulosis without focal diverticulitis. 3. Small to moderate hiatal hernia. Aortic Atherosclerosis (ICD10-I70.0). Electronically Signed   By: Narda Rutherford M.D.   On: 01/04/2022 17:46     DG Chest 2 View    Result Date: 01/04/2022  CLINICAL DATA:  Per ED triage notes: "Patient here with daughter who reports prior to a week and a half ago patient was independent and able to care for herself, now patient is feeling globally weak, not able to ambulate independently, endorses chills, nausea, with emesis, dry heaving,abdominal pain and SHOB. Denies chest pain. Denies any recent falls."  EXAM: CHEST - 2 VIEW COMPARISON:  None. FINDINGS: Cardiac silhouette borderline enlarged. No mediastinal or hilar masses. No evidence of adenopathy. Mild pleuroparenchymal scarring at the apices. Mild linear scarring or atelectasis at the left lung base. Lungs are hyperexpanded, otherwise clear. No pleural effusion or pneumothorax. Severe compression fracture of a midthoracic vertebra, likely chronic but of unclear chronicity. No other fractures. Skeletal structures are demineralized. IMPRESSION: 1. No acute cardiopulmonary disease. 2. Severe compression fracture of a midthoracic vertebra of unclear chronicity. Electronically Signed   By: Amie Portland M.D.   On: 01/04/2022 15:24       LOS: 2 days     Huey Bienenstock , MD  [facility]  01/06/2022, 2:26 PM    If 7PM-7AM, please contact night-coverage  [email or web address]  Electronically signed by Starleen Arms, MD at 01/06/2022  2:39 PM EDT

## 2022-01-06 NOTE — Progress Notes (Signed)
Formatting of this note might be different from the original.    Transition of Care [facility]) Screening Note    Patient Details   Name: Mary Woodward  Date of Birth: 1927-06-25    Transition of Care [facility]) CM/SW Contact:     Harriet Masson, RN  Phone Number:  01/06/2022, 8:10 AM    Transition of Care Department [facility]) has reviewed patient and no TOC needs have been identified at this time. We will continue to monitor patient advancement through interdisciplinary progression rounds. If new patient transition needs arise, please place a TOC consult.      Electronically signed by Harriet Masson, RN at 01/06/2022  8:10 AM EDT

## 2022-01-06 NOTE — Unmapped (Signed)
Formatting of this note is different from the original.  Images from the original note were not included.  Transition of Care [facility]) - Initial/Assessment Note     Patient Details   Name: Mary Woodward  MRN: [id number]  Date of Birth: [birth year]    Transition of Care [facility]) CM/SW Contact:     Harriet Masson, RN  Phone Number:  01/06/2022, 3:29 PM    Clinical Narrative:                  Spoke to patient and son by phone regarding transition needs. Paitent lives alone in an apartment. Family is available to transport to apts and check on patient. Patient has a lady that helps her named Marisue Ivan. Patient is agreeable to home health and dme. Patient defers to [facility] to find highly rated home health agency. Cheyrl with [facility] accepted referral. Patient agreeable to use in house provider adapt for dme. Spoke to [place] and rollator will be delivered to room prior to discharge.   TOC will continue to follow for needs.     Need home health PT and aide order    Expected Discharge Plan: Home w Home Health Services  Barriers to Discharge: Continued Medical Work up    Patient Goals and CMS Choice  Patient states their goals for this hospitalization and ongoing recovery are:: return home  CMS Medicare.gov Compare Post Acute Care list provided to:: Patient  Choice offered to / list presented to : Patient    Expected Discharge Plan and Services  Expected Discharge Plan: Home w Home Health Services    Discharge Planning Services: CM Consult  Post Acute Care Choice: Home Health, Durable Medical Equipment  Living arrangements for the past 2 months: Apartment    DME Arranged: Walker rolling with seat  DME Agency: [employer]  Date DME Agency Contacted: 01/06/22  Time DME Agency Contacted: [phone]  Representative spoke with at DME Agency: Lawernce Keas  HH Arranged: PT, Nurse's Aide  HH Agency: [facility]  Date [facility] Agency Contacted: 01/06/22  Time HH Agency Contacted: 1417  Representative spoke with at [facility] Agency: Elnita Maxwell    Prior  Living Arrangements/Services  Living arrangements for the past 2 months: Apartment  Lives with:: Self  Patient language and need for interpreter reviewed:: Yes  Do you feel safe going back to the place where you live?: Yes       Need for Family Participation in Patient Care: Yes (Comment)  Care giver support system in place?: Yes (comment)  Current home services: DME  Criminal Activity/Legal Involvement Pertinent to Current Situation/Hospitalization: No - Comment as needed    Activities of Daily Living        Permission Sought/Granted  Permission sought to share information with : Photographer granted to share info w AGENCY: [facility]        Emotional Assessment    Attitude/Demeanor/Rapport: Engaged  Affect (typically observed): Accepting  Orientation: : Oriented to Self, Oriented to Place, Oriented to  Time, Oriented to Situation  Alcohol / Substance Use: Not Applicable  Psych Involvement: No (comment)    Admission diagnosis:  Lower urinary tract infectious disease [N39.0]  UTI (urinary tract infection) [N39.0]  Weakness [R53.1]  Patient Active Problem List    Diagnosis Date Noted    Essential hypertension 01/04/2022    PAF (paroxysmal atrial fibrillation) (HCC) 01/04/2022    GERD (gastroesophageal reflux disease) 01/04/2022    Hyperlipidemia 01/04/2022  Osteoporosis 01/04/2022    Hyponatremia 01/04/2022    AKI (acute kidney injury) (HCC) 01/04/2022    Normocytic anemia 01/04/2022    UTI (urinary tract infection) 01/04/2022    Hyperkalemia 01/04/2022    Helicobacter pylori gastritis 04/02/2020     PCP:  Alinda Deem, MD  Pharmacy:    [facility] - [place], [place] - [address]  [address]  [place] [address]  Phone: [phone] Fax: [phone]    [facility]  [address] [place] [address]  Phone: [phone] Fax: [phone]    Social Determinants of Health (SDOH) Interventions      Readmission Risk Interventions     No data to  display             Electronically signed by Harriet Masson, RN at 01/06/2022  3:34 PM EDT

## 2022-01-06 NOTE — Unmapped (Signed)
Formatting of this note is different from the original.  Occupational Therapy Evaluation  Patient Details  Name: MOSELLA FALTER  MRN: [id number]  DOB: 1927-06-04  Today's Date: 01/06/2022    History of Present Illness Pt is a 88 y/o female admitted secondary to increased weakness. Found to have UTI. PMH includes a fib, HTN, PE, and osteoporosis.     Clinical Impression    Pt walked without AD in her home and was completing ADLs independently. She has a caregiver and supportive family who help with IADLs. Pt presents with generalized weakness and impaired standing balance. She overall requires min guard assist with RW for ambulation and set up to min assist for ADLs. Pt likely to progress well as medical issues resolve. Recommend pt's family stay with her until she has returned to her baseline. Will follow acutely.       Recommendations for follow up therapy are one component of a multi-disciplinary discharge planning process, led by the attending physician.  Recommendations may be updated based on patient status, additional functional criteria and insurance authorization.     Follow Up Recommendations   No OT follow up     Assistance Recommended at Discharge Frequent or constant Supervision/Assistance (initially)   Patient can return home with the following A little help with walking and/or transfers;A little help with bathing/dressing/bathroom;Assistance with cooking/housework;Assist for transportation;Help with stairs or ramp for entrance      Functional Status Assessment   Patient has had a recent decline in their functional status and demonstrates the ability to make significant improvements in function in a reasonable and predictable amount of time.   Equipment Recommendations   None recommended by OT     Recommendations for Other Services        Precautions / Restrictions Precautions  Precautions: Fall  Restrictions  Weight Bearing Restrictions: No         Mobility Bed Mobility                General bed  mobility comments: pt in chair      Transfers  Overall transfer level: Needs assistance  Equipment used: Rolling walker (2 wheels)  Transfers: Sit to/from Stand  Sit to Stand: Min guard            General transfer comment: from recliner and toilet        Balance Overall balance assessment: Needs assistance    Sitting balance-Leahy Scale: Good        Standing balance-Leahy Scale: Fair  Standing balance comment: can release walker in standing for pericare and to manage undergarment                            ADL either performed or assessed with clinical judgement     ADL Overall ADL's : Needs assistance/impaired  Eating/Feeding: Independent;Sitting    Grooming: Set up;Wash/dry hands;Wash/dry face;Oral care;Sitting    Upper Body Bathing: Minimal assistance;Sitting  Upper Body Bathing Details (indicate cue type and reason): assisted to wash back  Lower Body Bathing: Min guard;Sit to/from stand    Upper Body Dressing : Set up;Sitting    Lower Body Dressing: Min guard;Sit to/from stand    Toilet Transfer: Min guard;Ambulation;Regular Toilet;Rolling walker (2 wheels);Grab bars    Toileting- Clothing Manipulation and Hygiene: Min guard;Sit to/from stand        Functional mobility during ADLs: Min guard;Rolling walker (2 wheels)      Vision Patient Visual  Report: No change from baseline  Additional Comments: per son, pt with low vision at baseline     Perception      Praxis        Pertinent Vitals/Pain Pain Assessment  Pain Assessment: No/denies pain     Hand Dominance Right    Extremity/Trunk Assessment      Lower Extremity Assessment  Lower Extremity Assessment: Overall WFL for tasks assessed    Cervical / Trunk Assessment  Cervical / Trunk Assessment: Kyphotic    Communication Communication  Communication: HOH    Cognition Arousal/Alertness: Awake/alert  Behavior During Therapy: WFL for tasks assessed/performed  Overall Cognitive Status: Within Functional Limits for tasks  assessed                                        General Comments  HR 70 bpm, SpO2 94-96% on RA; BP map >65 prior to standing and no dizziness reported      Exercises      Shoulder Instructions       Home Living Family/patient expects to be discharged to:: Private residence  Living Arrangements: Alone  Available Help at Discharge: Family;Available 24 hours/day;Personal care attendant  Type of Home: Apartment  Home Access: Elevator      Home Layout: One level      Bathroom Shower/Tub: Tub/shower unit;Walk-in shower    Bathroom Toilet: Handicapped height      Home Equipment: Agricultural consultant (2 wheels)            Prior Functioning/Environment Prior Level of Function : Needs assist              Mobility Comments: Normally independent, but has been using RW within the last couple days before admission.  ADLs Comments: assisted for some IADLs          OT Problem List: Impaired balance (sitting and/or standing);Decreased strength      OT Treatment/Interventions: Self-care/ADL training;DME and/or AE instruction;Therapeutic activities;Patient/family education;Balance training     OT Goals(Current goals can be found in the care plan section) Acute Rehab OT Goals  OT Goal Formulation: With patient  Time For Goal Achievement: 01/20/22  Potential to Achieve Goals: Good  ADL Goals  Pt Will Perform Grooming: with modified independence;standing  Pt Will Perform Lower Body Bathing: with modified independence;sit to/from stand  Pt Will Perform Lower Body Dressing: with modified independence;sit to/from stand  Pt Will Transfer to Toilet: with modified independence;ambulating;regular height toilet  Pt Will Perform Toileting - Clothing Manipulation and hygiene: with modified independence;sit to/from stand   OT Frequency: Min 2X/week      Co-evaluation                AM-PAC OT "6 Clicks" Daily Activity      Outcome Measure Help from another person eating meals?: None  Help from another person taking care of personal grooming?: A  Little  Help from another person toileting, which includes using toliet, bedpan, or urinal?: A Little  Help from another person bathing (including washing, rinsing, drying)?: None  Help from another person to put on and taking off regular upper body clothing?: A Little    6 Click Score: 17    End of Session Equipment Utilized During Treatment: Rolling walker (2 wheels)  Nurse Communication: Other (comment) (had bath, needs purewick replaced)    Activity Tolerance: Patient tolerated treatment well  Patient left: in  chair;with call bell/phone within reach;with chair alarm set;with family/visitor present    OT Visit Diagnosis: Unsteadiness on feet (R26.81);Other abnormalities of gait and mobility (R26.89);Muscle weakness (generalized) (M62.81)     Time: 1610-9604  OT Time Calculation (min): 61 min  Charges:  OT General Charges  $OT Visit: 1 Visit  OT Evaluation  $OT Eval Moderate Complexity: 1 Mod  OT Treatments  $Self Care/Home Management : 38-52 mins    .myinfo    Evern Bio  01/06/2022, 3:27 PM  Electronically signed by Shyrl Numbers, OT at 01/06/2022  3:29 PM EDT

## 2022-01-06 NOTE — Progress Notes (Signed)
Formatting of this note is different from the original.  Physical Therapy Treatment  Patient Details  Name: Mary Woodward  MRN: [id number]  DOB: 1927-11-08  Today's Date: 01/06/2022    History of Present Illness Pt is a 88 y/o female admitted secondary to increased weakness. Found to have UTI. PMH includes a fib, HTN, PE, and osteoporosis.      PT Comments      Pt received in supine, pleasantly cooperative and with good effort and tolerance for gait trial with rollator and transfer training. Pt needs reminders for safe use of rollator (locking brakes prior to sitting/standing) but fair safety overall with rollator and no overt LOB for longer household distance gait trial in hallway. Pt reports 5/10 modified RPE (fatigue) at end of session. Pt up to recliner for lunch and motivated to work with OT on ADLs later in day. Pt continues to benefit from PT services to progress toward functional mobility goals.  DME recommendations updated below per discussion with supervising PT [place].   Recommendations for follow up therapy are one component of a multi-disciplinary discharge planning process, led by the attending physician.  Recommendations may be updated based on patient status, additional functional criteria and insurance authorization.    Follow Up Recommendations   Home health PT      Assistance Recommended at Discharge Frequent or constant Supervision/Assistance (initially)   Patient can return home with the following A little help with bathing/dressing/bathroom;Assistance with cooking/housework;Help with stairs or ramp for entrance;Assist for transportation    Equipment Recommendations   Rollator (4 wheels)     Recommendations for Other Services        Precautions / Restrictions Precautions  Precautions: Fall  Restrictions  Weight Bearing Restrictions: No       Mobility   Bed Mobility  Overal bed mobility: Needs Assistance  Bed Mobility: Supine to Sit      Supine to sit: Min guard      General bed mobility  comments: pt using bed features and railings, cues for technique      Transfers  Overall transfer level: Needs assistance  Equipment used: Rollator (4 wheels)  Transfers: Sit to/from Stand  Sit to Stand: Min guard            General transfer comment: from EOB and to recliner, cues for locking rollator brakes and safer technique      Ambulation/Gait  Ambulation/Gait assistance: Supervision  Gait Distance (Feet): 250 Feet  Assistive device: Rollator (4 wheels)  Gait Pattern/deviations: Step-through pattern, Decreased stride length  Gait velocity: variable; grossly 0.2-0.5 m/s      General Gait Details: cues for proximity to rollator, use of brakes, activity pacing; no overt LOB, fair speed/cadence      Balance Overall balance assessment: Needs assistance  Sitting-balance support: No upper extremity supported, Feet supported  Sitting balance-Leahy Scale: Good      Standing balance support: Bilateral upper extremity supported, During functional activity  Standing balance-Leahy Scale: Fair  Standing balance comment: fair balance with rollator; did not assess without                              Cognition Arousal/Alertness: Awake/alert  Behavior During Therapy: WFL for tasks assessed/performed  Overall Cognitive Status: No family/caregiver present to determine baseline cognitive functioning  General Comments: needs reminders for how to lock rollator, it was a new device for her so may need continued teaching.          Exercises Other Exercises  Other Exercises: seated BLE AROM: hip flexion, LAQ x5-10 reps ea, encouraged her to perform at least 3 sets per day      General Comments General comments (skin integrity, edema, etc.): HR 70 bpm, SpO2 94-96% on RA; BP map >65 prior to standing and no dizziness reported        Pertinent Vitals/Pain Pain Assessment  Pain Assessment: No/denies pain           PT Goals (current goals can now be found in the care plan section) Acute Rehab PT  Goals  Patient Stated Goal: to go home  PT Goal Formulation: With patient  Time For Goal Achievement: 01/19/22  Progress towards PT goals: Progressing toward goals      Frequency     Min 3X/week      PT Plan Current plan remains appropriate;Equipment recommendations need to be updated       AM-PAC PT "6 Clicks" Mobility    Outcome Measure   Help needed turning from your back to your side while in a flat bed without using bedrails?: None  Help needed moving from lying on your back to sitting on the side of a flat bed without using bedrails?: A Little  Help needed moving to and from a bed to a chair (including a wheelchair)?: A Little  Help needed standing up from a chair using your arms (e.g., wheelchair or bedside chair)?: A Little  Help needed to walk in hospital room?: A Little  Help needed climbing 3-5 steps with a railing? : A Lot  6 Click Score: 18      End of Session Equipment Utilized During Treatment: Gait belt  Activity Tolerance: Patient tolerated treatment well  Patient left: with call bell/phone within reach;in chair;with chair alarm set  Nurse Communication: Mobility status;Other (comment) (pt requesting OT assist her to bathe after she eats lunch)  PT Visit Diagnosis: Unsteadiness on feet (R26.81);Muscle weakness (generalized) (M62.81);Difficulty in walking, not elsewhere classified (R26.2)      Time: 1201-1239  PT Time Calculation (min) (ACUTE ONLY): 38 min    Charges:  $Gait Training: 23-37 mins  $Therapeutic Activity: 8-22 mins               Carly P., PTA  Acute Rehabilitation Services  Secure Chat Preferred 9a-5:30pm  Office: [phone]     [place]  01/06/2022, 2:32 PM    Electronically signed by Lehman Prom, PT at 01/06/2022  2:51 PM EDT    Associated attestation - Lehman Prom, PT - 01/06/2022  2:51 PM EDT  Formatting of this note might be different from the original.  Agree with updated recommendations of PTA.     Farley Ly, PT, DPT   Acute Rehabilitation Services    Office: [phone]

## 2022-01-07 DIAGNOSIS — I10 Essential (primary) hypertension: Secondary | ICD-10-CM | POA: Diagnosis not present

## 2022-01-07 DIAGNOSIS — N179 Acute kidney failure, unspecified: Secondary | ICD-10-CM | POA: Diagnosis not present

## 2022-01-07 DIAGNOSIS — N3000 Acute cystitis without hematuria: Secondary | ICD-10-CM | POA: Diagnosis not present

## 2022-01-07 LAB — URINE CULTURE: Culture: >=100000 — AB

## 2022-01-07 MED ORDER — METOPROLOL TARTRATE 50 MG PO TABS: 50.0000 mg | ORAL_TABLET | ORAL | Status: AC

## 2022-01-07 NOTE — TOC Transition Note (Signed)
Transition of Care [facility]) - CM/SW Discharge Note   Patient Details  Name: Caitlin Wilson MRN: [id number] Date of Birth: 07/11/1927  [facility] CM/SW Contact:  Cyndi Bender, RN Phone Number: 01/07/2022, 12:39 PM   Clinical Narrative:     Patient stable for discharge. Cheryl with [facility] notified of discharge.  Patient has transportation home.  No other toc needs at this time.  Final next level of care: [place] Barriers to Discharge: Barriers Resolved   Patient Goals and CMS Choice Patient states their goals for this hospitalization and ongoing recovery are:: return home CMS Medicare.gov Compare Post Acute Care list provided to:: Patient Choice offered to / list presented to : Patient  Discharge Placement             home          Discharge Plan and Services   Discharge Planning Services: CM Consult Post Acute Care Choice: Home Health, Durable Medical Equipment          DME Arranged: Walker rolling with seat DME Agency: [employer] Date DME Agency Contacted: 01/06/22 Time DME Agency Contacted: [phone] Representative spoke with at DME Agency: Annie [place] HH Arranged: PT, Nurse's Aide [place] Agency: [place] Date [place]: 01/06/22 Time [place]: [place] Representative spoke with at [place]: [place] ([place]) Interventions     Readmission Risk Interventions    01/07/2022   12:38 PM  Readmission Risk Prevention Plan  Post Dischage Appt Complete  Medication Screening Complete  Transportation Screening Complete

## 2022-01-07 NOTE — Progress Notes (Signed)
Physical Therapy Treatment Patient Details Name: Caitlin Wilson MRN: [id number] DOB: [birth year] Today's Date: 01/07/2022   History of Present Illness Pt is a 88 y/o female admitted secondary to increased weakness. Found to have UTI. PMH includes a fib, HTN, PE, and osteoporosis.    PT Comments    Pt received in supine, agreeable to therapy session and with good participation and tolerance for gait progression with rollator and safety instruction on use of brakes with device. Pt given gait belt and would benefit from further reinforcement on use of brakes with HHPT but good stability upon standing/sitting otherwise. Pt continues to benefit from PT services to progress toward functional mobility goals. Pt agreeabel to sit up in chair for lunch, purewick reapplied as per pt she has pre-existing urinary incontinence. Pt continues to benefit from PT services to progress toward functional mobility goals.   Recommendations for follow up therapy are one component of a multi-disciplinary discharge planning process, led by the attending physician.  Recommendations may be updated based on patient status, additional functional criteria and insurance authorization.  Follow Up Recommendations  Home health PT     Assistance Recommended at Discharge Frequent or constant Supervision/Assistance (initially)  Patient can return home with the following A little help with bathing/dressing/bathroom;Assistance with cooking/housework;Help with stairs or ramp for entrance;Assist for transportation   Equipment Recommendations  Rollator (4 wheels)    Recommendations for Other Services       Precautions / Restrictions Precautions Precautions: Fall Restrictions Weight Bearing Restrictions: No     Mobility  Bed Mobility Overal bed mobility: Needs Assistance Bed Mobility: Supine to Sit     Supine to sit: Supervision     General bed mobility comments: flat bed, no railing; increased time     Transfers Overall transfer level: Needs assistance Equipment used: Rollator (4 wheels) Transfers: Sit to/from Stand Sit to Stand: Supervision           General transfer comment: cues for safe UE placement and locking/unlocking rollator for safety needed; pt able to stand and sit with and without UE support and good eccentric control to sit    Ambulation/Gait Ambulation/Gait assistance: Supervision Gait Distance (Feet): 255 Feet Assistive device: Rollator (4 wheels) Gait Pattern/deviations: Step-through pattern, Decreased stride length Gait velocity: variable; grossly 0.2-0.5 m/s     General Gait Details: cues for proximity to rollator and posture, activity pacing; no overt LOB, fair speed/cadence. standing break in hallway while pt waiting for rollator handle height to be adjusted no loss of balance unsupported;     Balance Overall balance assessment: Needs assistance   Sitting balance-Leahy Scale: Good       Standing balance-Leahy Scale: Fair Standing balance comment: can release walker in standing unsupported for ~2 mins no LOB for static tasks                            Cognition Arousal/Alertness: Awake/alert Behavior During Therapy: WFL for tasks assessed/performed Overall Cognitive Status: Within Functional Limits for tasks assessed                                 General Comments: needs continued reminders for how to lock rollator, it was a new device for her so may need continued teaching.        Exercises      General Comments General comments (skin integrity, edema, etc.): BP 117/59 (  76) supine; BP 145/65 (88) seated; HR 61-71 bpm with exertion      Pertinent Vitals/Pain Pain Assessment Pain Assessment: No/denies pain     PT Goals (current goals can now be found in the care plan section) Acute Rehab PT Goals Patient Stated Goal: to go home PT Goal Formulation: With patient Time For Goal Achievement: 01/19/22 Progress  towards PT goals: Progressing toward goals    Frequency    Min 3X/week      PT Plan Current plan remains appropriate;Equipment recommendations need to be updated       AM-PAC PT "6 Clicks" Mobility   Outcome Measure  Help needed turning from your back to your side while in a flat bed without using bedrails?: None Help needed moving from lying on your back to sitting on the side of a flat bed without using bedrails?: A Little Help needed moving to and from a bed to a chair (including a wheelchair)?: A Little Help needed standing up from a chair using your arms (e.g., wheelchair or bedside chair)?: A Little Help needed to walk in hospital room?: A Little Help needed climbing 3-5 steps with a railing? : A Little 6 Click Score: 19    End of Session Equipment Utilized During Treatment: Gait belt Activity Tolerance: Patient tolerated treatment well Patient left: in chair;with call bell/phone within reach;with chair alarm set Nurse Communication: Mobility status;Other (comment) (pt may need assist to complete her lunch order, she hasn't decided what to order yet. instructed on how to call but may need help.) PT Visit Diagnosis: Unsteadiness on feet (R26.81);Muscle weakness (generalized) (M62.81);Difficulty in walking, not elsewhere classified (R26.2)     Time: 1204-1228 PT Time Calculation (min) (ACUTE ONLY): 24 min  Charges:  $Gait Training: 8-22 mins $Therapeutic Activity: 8-22 mins                     [name] P., PTA Acute Rehabilitation Services Secure Chat Preferred 9a-5:30pm Office: [place] 01/07/2022, 1:30 PM

## 2022-01-07 NOTE — Plan of Care (Signed)

## 2022-01-07 NOTE — Care Management Important Message (Signed)
Important Message  Patient Details  Name: Caitlin Wilson MRN: [id number] Date of Birth: 11/05/1927   Medicare Important Message Given:  Yes     Orbie Pyo 01/07/2022, 2:18 PM

## 2022-01-07 NOTE — Discharge Instructions (Signed)
Follow with Primary MD [name], MD in 7 days   Get CBC, CMP, checked  by Primary MD next visit.    Activity: As tolerated with Full fall precautions use walker/cane & assistance as needed   Disposition Home    Diet: Heart Healthy .   On your next visit with your primary care physician please Get Medicines reviewed and adjusted.   Please request your Prim.MD to go over all Hospital Tests and Procedure/Radiological results at the follow up, please get all Hospital records sent to your Prim MD by signing hospital release before you go home.   If you experience worsening of your admission symptoms, develop shortness of breath, life threatening emergency, suicidal or homicidal thoughts you must seek medical attention immediately by calling 911 or calling your MD immediately  if symptoms less severe.  You Must read complete instructions/literature along with all the possible adverse reactions/side effects for all the Medicines you take and that have been prescribed to you. Take any new Medicines after you have completely understood and accpet all the possible adverse reactions/side effects.   Do not drive, operating heavy machinery, perform activities at heights, swimming or participation in water activities or provide baby sitting services if your were admitted for syncope or siezures until you have seen by Primary MD or a Neurologist and advised to do so again.  Do not drive when taking Pain medications.    Do not take more than prescribed Pain, Sleep and Anxiety Medications  Special Instructions: If you have smoked or chewed Tobacco  in the last 2 yrs please stop smoking, stop any regular Alcohol  and or any Recreational drug use.  Wear Seat belts while driving.   Please note  You were cared for by a hospitalist during your hospital stay. If you have any questions about your discharge medications or the care you received while you were in the hospital after you are  discharged, you can call the unit and asked to speak with the hospitalist on call if the hospitalist that took care of you is not available. Once you are discharged, your primary care physician will handle any further medical issues. Please note that NO REFILLS for any discharge medications will be authorized once you are discharged, as it is imperative that you return to your primary care physician (or establish a relationship with a primary care physician if you do not have one) for your aftercare needs so that they can reassess your need for medications and monitor your lab values.

## 2022-01-07 NOTE — Discharge Summary (Signed)
Physician Discharge Summary  Caitlin Wilson ZDG:[id number] DOB: [birth year] DOA: 01/04/2022  PCP: [name], MD  Admit date: 01/04/2022 Discharge date: 01/07/2022  Admitted From: Home Disposition:  Home   Recommendations for Outpatient Follow-up:  Follow up with PCP in 1-2 weeks Please obtain BMP/CBC in one week Adjust blood pressure medication at home as needed, her olmesartan has been discontinued on discharge as blood pressure has been soft to stable during hospital stay.  Home Health:YES   Discharge Condition:Stable CODE STATUS:FULL Diet recommendation: Heart Healthy  Brief/Interim Summary:  Caitlin Wilson is a 88 y.o. female with history of atrial fibrillation, osteoarthritis, hyperlipidemia, primary hypertension, remote PE, osteoporosis. Patient presented secondary to weakness and confusion with concern for UTI and dehydration. Patient was started empirically on Ceftriaxone IV, IV fluids and urine cultures obtained.   Generalized weakness Concern this is secondary to UTI. Patient with anemia as well. -She was seen by PT/OT, home health has been arranged.Marland Kitchen  UTI -She was treated with 3 days of IV Rocephin, urine culture growing E. coli, pansensitive, no further need for antibiotics on discharge  Hyponatremia Encouraged to increase her oral intake, as well she was on D5 half-normal saline, she has been discontinued.  .   AKI Unknown true baseline.  Creatinine peaked at 1.4, it was 1.07 on discharge   Colonic wall thickening Involving descending/sigmoid colon. Clinically, unlikely infection however imaging suspicious for colitis.  Patient with history of sigmoid colon stenosis on colonoscopy in 2018, the area of abnormalities on imaging at the same site of her sigmoid stenosis, so imaging finding most likely related to already known colonic stenosis .   Primary hypertension Patient is managed on olmesartan and metoprolol tartrate as an outpatient. -Overall blood  pressure has been acceptable to soft on her home dose metoprolol, so she will be discharged on metoprolol and olmesartan has been discontinued   normocytic anemia No history/evidence of GI bleeding. History of Helicobacter pylori gastritis, treated.    GERD -Continue Protonix   Hyperlipidemia -Continue Lovastatin (substituted with Pravastatin while inpatient)   History of atrial fibrillation -Currently in sinus rhythm   LBBB -2D echo with no acute findings, she denies any chest pain or shortness of breath -Gust with her primary cardiologist Dr. Sabra Heck in [place], apparently left bundle branch block presents since 2018 Discharge Diagnoses:  Principal Problem:   UTI (urinary tract infection) Active Problems:   Essential hypertension   PAF (paroxysmal atrial fibrillation) (HCC)   GERD (gastroesophageal reflux disease)   Hyperlipidemia   Osteoporosis   Hyponatremia   AKI (acute kidney injury) ([place])   Normocytic anemia   Hyperkalemia    Discharge Instructions  Discharge Instructions     Diet - low sodium heart healthy   Complete by: As directed    Discharge instructions   Complete by: As directed    Follow with Primary MD [name], MD in 7 days   Get CBC, CMP, checked  by Primary MD next visit.    Activity: As tolerated with Full fall precautions use walker/cane & assistance as needed   Disposition Home    Diet: Heart Healthy .   On your next visit with your primary care physician please Get Medicines reviewed and adjusted.   Please request your Prim.MD to go over all Hospital Tests and Procedure/Radiological results at the follow up, please get all Hospital records sent to your Prim MD by signing hospital release before you go home.   If you experience worsening of your  admission symptoms, develop shortness of breath, life threatening emergency, suicidal or homicidal thoughts you must seek medical attention immediately by calling 911 or calling your  MD immediately  if symptoms less severe.  You Must read complete instructions/literature along with all the possible adverse reactions/side effects for all the Medicines you take and that have been prescribed to you. Take any new Medicines after you have completely understood and accpet all the possible adverse reactions/side effects.   Do not drive, operating heavy machinery, perform activities at heights, swimming or participation in water activities or provide baby sitting services if your were admitted for syncope or siezures until you have seen by Primary MD or a Neurologist and advised to do so again.  Do not drive when taking Pain medications.    Do not take more than prescribed Pain, Sleep and Anxiety Medications  Special Instructions: If you have smoked or chewed Tobacco  in the last 2 yrs please stop smoking, stop any regular Alcohol  and or any Recreational drug use.  Wear Seat belts while driving.   Please note  You were cared for by a hospitalist during your hospital stay. If you have any questions about your discharge medications or the care you received while you were in the hospital after you are discharged, you can call the unit and asked to speak with the hospitalist on call if the hospitalist that took care of you is not available. Once you are discharged, your primary care physician will handle any further medical issues. Please note that NO REFILLS for any discharge medications will be authorized once you are discharged, as it is imperative that you return to your primary care physician (or establish a relationship with a primary care physician if you do not have one) for your aftercare needs so that they can reassess your need for medications and monitor your lab values.   Increase activity slowly   Complete by: As directed       Allergies as of 01/07/2022       Reactions   Atorvastatin Itching        Medication List     STOP taking these medications     ibuprofen 200 MG tablet Commonly known as: ADVIL   olmesartan 40 MG tablet Commonly known as: BENICAR       TAKE these medications    acetaminophen 500 MG tablet Commonly known as: TYLENOL Take 500 mg by mouth every 6 (six) hours as needed for mild pain.   aspirin 325 MG tablet Take 325 mg by mouth daily.   bisacodyl 5 MG EC tablet Generic drug: bisacodyl 1-2 daily as needed for constipation What changed:  how much to take how to take this when to take this additional instructions   hydroxypropyl methylcellulose / hypromellose 2.5 % ophthalmic solution Commonly known as: ISOPTO TEARS / GONIOVISC Place 1 drop into both eyes daily as needed for dry eyes.   ICY HOT EX Apply 1 application  topically daily as needed (ankle pain).   lovastatin 10 MG tablet Commonly known as: MEVACOR Take 10 mg by mouth at bedtime.   metoprolol tartrate 50 MG tablet Commonly known as: LOPRESSOR Take 1-1.5 tablets (50-75 mg total) by mouth See admin instructions. Take 1 tablet (50 mg) in the morning and then take 1/2 tablet (25 mg) at bedtime What changed: additional instructions   pantoprazole 40 MG tablet Commonly known as: PROTONIX Daily while taking ibuprofen What changed:  how much to take how to take  this when to take this additional instructions               Durable Medical Equipment  (From admission, onward)           Start     Ordered   01/06/22 1349  For home use only DME 4 wheeled rolling walker with seat  Once       Question:  Patient needs a walker to treat with the following condition  Answer:  Weakness   01/06/22 1348            [facility] Follow up.   Why: Home Health has been arranged. They will contact you to schedule apt 48hrs post discharge. Contact information: [address] [place] [address] [phone]                Allergies  Allergen Reactions   Atorvastatin Itching     Consultations: None   Procedures/Studies: ECHOCARDIOGRAM COMPLETE  Result Date: 01/05/2022    ECHOCARDIOGRAM REPORT   Patient Name:   Caitlin Wilson Date of Exam: 01/05/2022 Medical Rec #:  [id number]        Height:       59.0 in Accession #:    [id number]       Weight:       140.0 lb Date of Birth:  [birth year]        BSA:          1.585 m Patient Age:    4 years         BP:           148/76 mmHg Patient Gender: F                HR:           87 bpm. Exam Location:  Inpatient Procedure: 2D Echo and Intracardiac Opacification Agent Indications:    Heart Block, LBBB  History:        Patient has no prior history of Echocardiogram examinations.                 Arrythmias:Atrial Fibrillation and LBBB; Risk                 Factors:Hypertension and Dyslipidemia.  Sonographer:    Harvie Junior Referring Phys: [place]  1. Left ventricular ejection fraction, by estimation, is 50 to 55%. The left ventricle has low normal function. The left ventricle has no regional wall motion abnormalities. Left ventricular diastolic parameters are consistent with Grade I diastolic dysfunction (impaired relaxation).  2. Right ventricular systolic function is normal. The right ventricular size is normal. There is normal pulmonary artery systolic pressure. The estimated right ventricular systolic pressure is 86.7 mmHg.  3. Left atrial size was moderately dilated.  4. The mitral valve is normal in structure. Trivial mitral valve regurgitation. No evidence of mitral stenosis.  5. The aortic valve is tricuspid. There is mild calcification of the aortic valve. There is mild thickening of the aortic valve. Aortic valve regurgitation is not visualized. Aortic valve sclerosis is present, with no evidence of aortic valve stenosis.  6. The inferior vena cava is normal in size with greater than 50% respiratory variability, suggesting right atrial pressure of 3 mmHg. FINDINGS  Left Ventricle: Left ventricular ejection  fraction, by estimation, is 50 to 55%. The left ventricle has low normal function. The left ventricle has no regional wall motion abnormalities. Definity contrast  agent was given IV to delineate the left ventricular endocardial borders. The left ventricular internal cavity size was normal in size. There is no left ventricular hypertrophy. Abnormal (paradoxical) septal motion, consistent with left bundle branch block. Left ventricular diastolic parameters  are consistent with Grade I diastolic dysfunction (impaired relaxation). Indeterminate filling pressures. Right Ventricle: The right ventricular size is normal. No increase in right ventricular wall thickness. Right ventricular systolic function is normal. There is normal pulmonary artery systolic pressure. The tricuspid regurgitant velocity is 2.51 m/s, and  with an assumed right atrial pressure of 3 mmHg, the estimated right ventricular systolic pressure is 18.5 mmHg. Left Atrium: Left atrial size was moderately dilated. Right Atrium: Right atrial size was normal in size. Pericardium: There is no evidence of pericardial effusion. Mitral Valve: The mitral valve is normal in structure. There is mild thickening of the mitral valve leaflet(s). Mild mitral annular calcification. Trivial mitral valve regurgitation. No evidence of mitral valve stenosis. Tricuspid Valve: The tricuspid valve is grossly normal. Tricuspid valve regurgitation is trivial. Aortic Valve: The aortic valve is tricuspid. There is mild calcification of the aortic valve. There is mild thickening of the aortic valve. Aortic valve regurgitation is not visualized. Aortic valve sclerosis is present, with no evidence of aortic valve stenosis. Aortic valve mean gradient measures 6.0 mmHg. Aortic valve peak gradient measures 9.1 mmHg. Aortic valve area, by VTI measures 3.26 cm. Pulmonic Valve: The pulmonic valve was grossly normal. Pulmonic valve regurgitation is trivial. Aorta: The aortic root and  ascending aorta are structurally normal, with no evidence of dilitation. Venous: The inferior vena cava is normal in size with greater than 50% respiratory variability, suggesting right atrial pressure of 3 mmHg. IAS/Shunts: The interatrial septum is aneurysmal. There is right bowing of the interatrial septum, suggestive of elevated left atrial pressure. No atrial level shunt detected by color flow Doppler.  LEFT VENTRICLE PLAX 2D LVIDd:         3.80 cm     Diastology LVIDs:         2.80 cm     LV e' medial:    6.96 cm/s LV PW:         1.00 cm     LV E/e' medial:  12.3 LV IVS:        1.00 cm     LV e' lateral:   7.40 cm/s LVOT diam:     2.30 cm     LV E/e' lateral: 11.6 LV SV:         96 LV SV Index:   60 LVOT Area:     4.15 cm  LV Volumes (MOD) LV vol d, MOD A2C: 82.7 ml LV vol d, MOD A4C: 86.5 ml LV vol s, MOD A2C: 40.3 ml LV vol s, MOD A4C: 38.7 ml LV SV MOD A2C:     42.4 ml LV SV MOD A4C:     37.8 ml LV SV MOD BP:      46.2 ml RIGHT VENTRICLE RV Basal diam:  3.40 cm RV Mid diam:    3.50 cm RV S prime:     17.90 cm/s TAPSE (M-mode): 2.4 cm LEFT ATRIUM             Index        RIGHT ATRIUM           Index LA diam:        3.80 cm 2.40 cm/m   RA Area:     10.30 cm  LA Vol (A2C):   55.1 ml 34.76 ml/m  RA Volume:   20.80 ml  13.12 ml/m LA Vol (A4C):   59.5 ml 37.54 ml/m LA Biplane Vol: 57.3 ml 36.15 ml/m  AORTIC VALVE                     PULMONIC VALVE AV Area (Vmax):    3.25 cm      PV Vmax:          1.28 m/s AV Area (Vmean):   3.15 cm      PV Peak grad:     6.6 mmHg AV Area (VTI):     3.26 cm      PR End Diast Vel: 2.92 msec AV Vmax:           151.00 cm/s AV Vmean:          113.000 cm/s AV VTI:            0.293 m AV Peak Grad:      9.1 mmHg AV Mean Grad:      6.0 mmHg LVOT Vmax:         118.00 cm/s LVOT Vmean:        85.600 cm/s LVOT VTI:          0.230 m LVOT/AV VTI ratio: 0.78  AORTA Ao Root diam: 3.40 cm Ao Asc diam:  2.50 cm MITRAL VALVE                TRICUSPID VALVE MV Area (PHT): 2.70 cm     TR Peak  grad:   25.2 mmHg MV Decel Time: 281 msec     TR Vmax:        251.00 cm/s MR Peak grad: 36.1 mmHg MR Vmax:      300.50 cm/s   SHUNTS MV E velocity: 85.70 cm/s   Systemic VTI:  0.23 m MV A velocity: 114.00 cm/s  Systemic Diam: 2.30 cm MV E/A ratio:  0.75 Mihai Croitoru MD Electronically signed by Sanda Klein MD Signature Date/Time: 01/05/2022/4:48:37 PM    Final    CT ABDOMEN PELVIS W CONTRAST  Result Date: 01/04/2022 CLINICAL DATA:  Acute abdominal pain.  Weakness. EXAM: CT ABDOMEN AND PELVIS WITH CONTRAST TECHNIQUE: Multidetector CT imaging of the abdomen and pelvis was performed using the standard protocol following bolus administration of intravenous contrast. RADIATION DOSE REDUCTION: This exam was performed according to the departmental dose-optimization program which includes automated exposure control, adjustment of the mA and/or kV according to patient size and/or use of iterative reconstruction technique. CONTRAST:  100m OMNIPAQUE IOHEXOL 300 MG/ML  SOLN COMPARISON:  None Available. FINDINGS: Lower chest: No acute airspace disease or pleural effusion lesion. Heart is normal in size with coronary artery calcifications. There is a hiatal hernia. Hepatobiliary: No focal liver abnormality is seen. No gallstones, gallbladder wall thickening, or biliary dilatation. Pancreas: Age related parenchymal atrophy. No ductal dilatation or inflammation. Spleen: Normal in size without focal abnormality. Adrenals/Urinary Tract: Normal adrenal glands. Extrarenal pelvis configuration, right greater than left. No hydronephrosis. No perinephric edema. No suspicious renal lesion. Unremarkable urinary bladder. Stomach/Bowel: Small to moderate hiatal hernia. Stomach is decompressed. There is no small bowel obstruction or inflammation. The appendix is not visualized, appendectomy per history small to moderate volume of colonic stool. There is diverticulosis from the splenic flexure distally. There is equivocal colonic wall  thickening involving the junction of the descending and sigmoid colon, series 6, image 48 with mild adjacent mesenteric stranding.  Vascular/Lymphatic: Moderate aortic atherosclerosis. Aortic tortuosity. No aneurysm. Patent portal vein. No enlarged lymph nodes in the abdomen or pelvis. Reproductive: Status post hysterectomy. No adnexal masses. Other: No free air or ascites. No abdominopelvic collection. Minimal fat in the inguinal canals. Musculoskeletal: Left hip arthroplasty. L3 compression fracture with vertebral plasty. Scoliosis with diffuse degenerative change in the spine. No acute osseous abnormalities are seen. IMPRESSION: 1. Equivocal short segment colonic wall thickening involving the junction of the descending and sigmoid colon with mild adjacent mesenteric stranding, suspicious for colitis. Recommend direct visualization with colonoscopy to exclude the possibility of underlying colonic neoplasm. 2. Left colonic diverticulosis without focal diverticulitis. 3. Small to moderate hiatal hernia. Aortic Atherosclerosis (ICD10-I70.0). Electronically Signed   By: Keith Rake M.D.   On: 01/04/2022 17:46   DG Chest 2 View  Result Date: 01/04/2022 CLINICAL DATA:  Per ED triage notes: "Patient here with daughter who reports prior to a week and a half ago patient was independent and able to care for herself, now patient is feeling globally weak, not able to ambulate independently, endorses chills, nausea, with emesis, dry heaving,abdominal pain and SHOB. Denies chest pain. Denies any recent falls." EXAM: CHEST - 2 VIEW COMPARISON:  None. FINDINGS: Cardiac silhouette borderline enlarged. No mediastinal or hilar masses. No evidence of adenopathy. Mild pleuroparenchymal scarring at the apices. Mild linear scarring or atelectasis at the left lung base. Lungs are hyperexpanded, otherwise clear. No pleural effusion or pneumothorax. Severe compression fracture of a midthoracic vertebra, likely chronic but of unclear  chronicity. No other fractures. Skeletal structures are demineralized. IMPRESSION: 1. No acute cardiopulmonary disease. 2. Severe compression fracture of a midthoracic vertebra of unclear chronicity. Electronically Signed   By: Lajean Manes M.D.   On: 01/04/2022 15:24      Subjective: No significant events overnight, she denies any complaints, she had good BM x2 yesterday  Discharge Exam: Vitals:   01/06/22 2000 01/07/22 0816  BP: (!) 148/55 139/73  Pulse: 74   Resp:  18  Temp: 98.2 F (36.8 C) 98.1 F (36.7 C)  SpO2: 95% 95%   Vitals:   01/06/22 1131 01/06/22 1619 01/06/22 2000 01/07/22 0816  BP: (!) 118/48 (!) 99/59 (!) 148/55 139/73  Pulse: 68 71 74   Resp: '20 17  18  '$ Temp: 98.4 F (36.9 C) 98.5 F (36.9 C) 98.2 F (36.8 C) 98.1 F (36.7 C)  TempSrc: Oral Oral Oral Oral  SpO2: 94% 92% 95% 95%  Weight:      Height:        General: Pt is alert, awake, not in acute distress Cardiovascular: RRR, S1/S2 +, no rubs, no gallops Respiratory: CTA bilaterally, no wheezing, no rhonchi Abdominal: Soft, NT, ND, bowel sounds + Extremities: no edema, no cyanosis    The results of significant diagnostics from this hospitalization (including imaging, microbiology, ancillary and laboratory) are listed below for reference.     Microbiology: Recent Results (from the past 240 hour(s))  Urine Culture     Status: Abnormal   Collection Time: 01/04/22  1:03 PM   Specimen: Urine, Clean Catch  Result Value Ref Range Status   Specimen Description URINE, CLEAN CATCH  Final   Special Requests   Final    NONE Performed at [facility], [place] [address] [place], [place] [address]    Culture >=100,000 COLONIES/mL ESCHERICHIA COLI (A)  Final   Report Status 01/07/2022 FINAL  Final   Organism ID, Bacteria ESCHERICHIA COLI (A)  Final  Susceptibility   Escherichia coli - MIC*    AMPICILLIN 4 SENSITIVE Sensitive     CEFAZOLIN <=4 SENSITIVE Sensitive     CEFEPIME <=0.12 SENSITIVE  Sensitive     CEFTRIAXONE <=0.25 SENSITIVE Sensitive     CIPROFLOXACIN <=0.25 SENSITIVE Sensitive     GENTAMICIN <=1 SENSITIVE Sensitive     IMIPENEM <=0.25 SENSITIVE Sensitive     NITROFURANTOIN <=16 SENSITIVE Sensitive     TRIMETH/SULFA <=20 SENSITIVE Sensitive     AMPICILLIN/SULBACTAM <=2 SENSITIVE Sensitive     PIP/TAZO <=4 SENSITIVE Sensitive     * >=100,000 COLONIES/mL ESCHERICHIA COLI  SARS Coronavirus 2 by RT PCR (hospital order, performed in [place] hospital lab) *cepheid single result test* Anterior Nasal Swab     Status: None   Collection Time: 01/04/22  4:20 PM   Specimen: Anterior Nasal Swab  Result Value Ref Range Status   SARS Coronavirus 2 by RT PCR NEGATIVE NEGATIVE Final    Comment: (NOTE) SARS-CoV-2 target nucleic acids are NOT DETECTED.  The SARS-CoV-2 RNA is generally detectable in upper and lower respiratory specimens during the acute phase of infection. The lowest concentration of SARS-CoV-2 viral copies this assay can detect is 250 copies / mL. A negative result does not preclude SARS-CoV-2 infection and should not be used as the sole basis for treatment or other patient management decisions.  A negative result may occur with improper specimen collection / handling, submission of specimen other than nasopharyngeal swab, presence of viral mutation(s) within the areas targeted by this assay, and inadequate number of viral copies (<250 copies / mL). A negative result must be combined with clinical observations, patient history, and epidemiological information.  Fact Sheet for Patients:   https://www.patel.info/  Fact Sheet for Healthcare Providers: https://hall.com/  This test is not yet approved or  cleared by the [place] FDA and has been authorized for detection and/or diagnosis of SARS-CoV-2 by FDA under an Emergency Use Authorization (EUA).  This EUA will remain in effect (meaning this test can be  used) for the duration of the COVID-19 declaration under Section 564(b)(1) of the Act, 21 U.S.C. section 360bbb-3(b)(1), unless the authorization is terminated or revoked sooner.  Performed at [facility], [place] [address], [place], [place] [address]      Labs: BNP (last 3 results) Recent Labs    01/04/22 1446  BNP 096.2*   Basic Metabolic Panel: Recent Labs  Lab 01/04/22 1446 01/04/22 2022 01/05/22 0519 01/05/22 0530 01/06/22 0320  NA 129*  --  130*  --  129*  K 5.2*  --  4.5  --  4.4  CL 95*  --  98  --  98  CO2 22  --  21*  --  22  GLUCOSE 91  --  105*  --  98  BUN 22  --  14  --  14  CREATININE 1.31* 1.42* 1.19*  --  1.07*  CALCIUM 9.5  --  9.0  --  8.8*  MG  --   --   --  1.8  --    Liver Function Tests: Recent Labs  Lab 01/04/22 1446 01/05/22 0519  AST 20 16  ALT 12 10  ALKPHOS 90 79  BILITOT 0.7 0.5  PROT 6.4* 5.9*  ALBUMIN 3.3* 2.8*   Recent Labs  Lab 01/04/22 1446  LIPASE 98*   No results for input(s): "AMMONIA" in the last 168 hours. CBC: Recent Labs  Lab 01/04/22 1446 01/04/22 2022 01/05/22 [id number]  01/06/22 0320  WBC 7.9 6.7 9.3 8.8  NEUTROABS 4.9  --   --   --   HGB 9.9* 9.4* 9.1* 9.8*  HCT 28.7* 27.7* 27.5* 29.2*  MCV 86.7 87.7 88.4 88.0  PLT 391 374 359 398   Cardiac Enzymes: No results for input(s): "CKTOTAL", "CKMB", "CKMBINDEX", "TROPONINI" in the last 168 hours. BNP: Invalid input(s): "POCBNP" CBG: No results for input(s): "GLUCAP" in the last 168 hours. D-Dimer No results for input(s): "DDIMER" in the last 72 hours. Hgb A1c No results for input(s): "HGBA1C" in the last 72 hours. Lipid Profile No results for input(s): "CHOL", "HDL", "LDLCALC", "TRIG", "CHOLHDL", "LDLDIRECT" in the last 72 hours. Thyroid function studies No results for input(s): "TSH", "T4TOTAL", "T3FREE", "THYROIDAB" in the last 72 hours.  Invalid input(s): "FREET3" Anemia work up No results for input(s): "VITAMINB12", "FOLATE", "FERRITIN",  "TIBC", "IRON", "RETICCTPCT" in the last 72 hours. Urinalysis    Component Value Date/Time   COLORURINE YELLOW 01/04/2022 1303   APPEARANCEUR CLOUDY (A) 01/04/2022 1303   LABSPEC 1.010 01/04/2022 1303   PHURINE 7.0 01/04/2022 1303   GLUCOSEU NEGATIVE 01/04/2022 1303   HGBUR NEGATIVE 01/04/2022 1303   BILIRUBINUR NEGATIVE 01/04/2022 1303   KETONESUR NEGATIVE 01/04/2022 1303   PROTEINUR NEGATIVE 01/04/2022 1303   NITRITE NEGATIVE 01/04/2022 1303   LEUKOCYTESUR LARGE (A) 01/04/2022 1303   Sepsis Labs Recent Labs  Lab 01/04/22 1446 01/04/22 2022 01/05/22 0519 01/06/22 0320  WBC 7.9 6.7 9.3 8.8   Microbiology Recent Results (from the past 240 hour(s))  Urine Culture     Status: Abnormal   Collection Time: 01/04/22  1:03 PM   Specimen: Urine, Clean Catch  Result Value Ref Range Status   Specimen Description URINE, CLEAN CATCH  Final   Special Requests   Final    NONE Performed at [facility], [place] [address] [place], [place] [address]    Culture >=100,000 COLONIES/mL ESCHERICHIA COLI (A)  Final   Report Status 01/07/2022 FINAL  Final   Organism ID, Bacteria ESCHERICHIA COLI (A)  Final      Susceptibility   Escherichia coli - MIC*    AMPICILLIN 4 SENSITIVE Sensitive     CEFAZOLIN <=4 SENSITIVE Sensitive     CEFEPIME <=0.12 SENSITIVE Sensitive     CEFTRIAXONE <=0.25 SENSITIVE Sensitive     CIPROFLOXACIN <=0.25 SENSITIVE Sensitive     GENTAMICIN <=1 SENSITIVE Sensitive     IMIPENEM <=0.25 SENSITIVE Sensitive     NITROFURANTOIN <=16 SENSITIVE Sensitive     TRIMETH/SULFA <=20 SENSITIVE Sensitive     AMPICILLIN/SULBACTAM <=2 SENSITIVE Sensitive     PIP/TAZO <=4 SENSITIVE Sensitive     * >=100,000 COLONIES/mL ESCHERICHIA COLI  SARS Coronavirus 2 by RT PCR (hospital order, performed in [place] hospital lab) *cepheid single result test* Anterior Nasal Swab     Status: None   Collection Time: 01/04/22  4:20 PM   Specimen: Anterior Nasal Swab  Result Value Ref Range  Status   SARS Coronavirus 2 by RT PCR NEGATIVE NEGATIVE Final    Comment: (NOTE) SARS-CoV-2 target nucleic acids are NOT DETECTED.  The SARS-CoV-2 RNA is generally detectable in upper and lower respiratory specimens during the acute phase of infection. The lowest concentration of SARS-CoV-2 viral copies this assay can detect is 250 copies / mL. A negative result does not preclude SARS-CoV-2 infection and should not be used as the sole basis for treatment or other patient management decisions.  A negative result may occur with improper  specimen collection / handling, submission of specimen other than nasopharyngeal swab, presence of viral mutation(s) within the areas targeted by this assay, and inadequate number of viral copies (<250 copies / mL). A negative result must be combined with clinical observations, patient history, and epidemiological information.  Fact Sheet for Patients:   https://www.patel.info/  Fact Sheet for Healthcare Providers: https://hall.com/  This test is not yet approved or  cleared by the [place] FDA and has been authorized for detection and/or diagnosis of SARS-CoV-2 by FDA under an Emergency Use Authorization (EUA).  This EUA will remain in effect (meaning this test can be used) for the duration of the COVID-19 declaration under Section 564(b)(1) of the Act, 21 U.S.C. section 360bbb-3(b)(1), unless the authorization is terminated or revoked sooner.  Performed at [facility], [place] [address], [place], [place] [address]      Time coordinating discharge: Over 30 minutes  SIGNED:   Phillips Climes, MD  [facility] 01/07/2022, 12:08 PM Pager   If 7PM-7AM, please contact night-coverage [email or web address] Password [email or web address]

## 2022-01-07 NOTE — Unmapped (Signed)
Formatting of this note might be different from the original.    Problem: Education:  Goal: Knowledge of General Education information will improve  Description: Including pain rating scale, medication(s)/side effects and non-pharmacologic comfort measures  Outcome: Adequate for Discharge    Problem: Health Behavior/Discharge Planning:  Goal: Ability to manage health-related needs will improve  Outcome: Adequate for Discharge    Problem: Clinical Measurements:  Goal: Ability to maintain clinical measurements within normal limits will improve  Outcome: Adequate for Discharge  Goal: Will remain free from infection  Outcome: Adequate for Discharge  Goal: Diagnostic test results will improve  Outcome: Adequate for Discharge  Goal: Respiratory complications will improve  Outcome: Adequate for Discharge  Goal: Cardiovascular complication will be avoided  Outcome: Adequate for Discharge    Problem: Activity:  Goal: Risk for activity intolerance will decrease  Outcome: Adequate for Discharge    Problem: Nutrition:  Goal: Adequate nutrition will be maintained  Outcome: Adequate for Discharge    Problem: Coping:  Goal: Level of anxiety will decrease  Outcome: Adequate for Discharge    Problem: Elimination:  Goal: Will not experience complications related to bowel motility  Outcome: Adequate for Discharge  Goal: Will not experience complications related to urinary retention  Outcome: Adequate for Discharge    Problem: Pain Managment:  Goal: General experience of comfort will improve  Outcome: Adequate for Discharge    Problem: Safety:  Goal: Ability to remain free from injury will improve  Outcome: Adequate for Discharge    Problem: Skin Integrity:  Goal: Risk for impaired skin integrity will decrease  Outcome: Adequate for Discharge    Electronically signed by Cleophas Dunker, RN at 01/07/2022  1:46 PM EDT

## 2022-01-07 NOTE — Unmapped (Signed)
Formatting of this note might be different from the original.  Important Message    Patient Details   Name: Mary Woodward  MRN: [id number]  Date of Birth: 10/28/1927    Medicare Important Message Given:  Yes    Dorena Bodo  01/07/2022, 2:18 PM  Electronically signed by Dorena Bodo D at 01/07/2022  2:18 PM EDT

## 2022-01-07 NOTE — Discharge Summary (Signed)
Formatting of this note is different from the original.  Physician Discharge Summary   Mary Woodward VHQ:[id number] DOB: 1927/05/24 DOA: 01/04/2022    PCP: Alinda Deem, MD    Admit date: 01/04/2022  Discharge date: 01/07/2022    Admitted From: Home  Disposition:  Home     Recommendations for Outpatient Follow-up:   Follow up with PCP in 1-2 weeks  Please obtain BMP/CBC in one week  Adjust blood pressure medication at home as needed, her olmesartan has been discontinued on discharge as blood pressure has been soft to stable during hospital stay.    Home Health:YES    Discharge Condition:Stable  CODE STATUS:FULL  Diet recommendation: Heart Healthy    Brief/Interim Summary:    Mary Woodward is a 88 y.o. female with history of atrial fibrillation, osteoarthritis, hyperlipidemia, primary hypertension, remote PE, osteoporosis. Patient presented secondary to weakness and confusion with concern for UTI and dehydration. Patient was started empirically on Ceftriaxone IV, IV fluids and urine cultures obtained.    Generalized weakness  Concern this is secondary to UTI. Patient with anemia as well.  -She was seen by PT/OT, home health has been arranged.Marland Kitchen    UTI  -She was treated with 3 days of IV Rocephin, urine culture growing E. coli, pansensitive, no further need for antibiotics on discharge    Hyponatremia  Encouraged to increase her oral intake, as well she was on D5 half-normal saline, she has been discontinued.  .     AKI  Unknown true baseline.  Creatinine peaked at 1.4, it was 1.07 on discharge    Colonic wall thickening  Involving descending/sigmoid colon. Clinically, unlikely infection however imaging suspicious for colitis.  Patient with history of sigmoid colon stenosis on colonoscopy in 2018, the area of abnormalities on imaging at the same site of her sigmoid stenosis, so imaging finding most likely related to already known colonic stenosis .    Primary hypertension  Patient is managed on olmesartan and  metoprolol tartrate as an outpatient.  -Overall blood pressure has been acceptable to soft on her home dose metoprolol, so she will be discharged on metoprolol and olmesartan has been discontinued     normocytic anemia  No history/evidence of GI bleeding. History of Helicobacter pylori gastritis, treated.      GERD  -Continue Protonix    Hyperlipidemia  -Continue Lovastatin (substituted with Pravastatin while inpatient)    History of atrial fibrillation  -Currently in sinus rhythm    LBBB  -2D echo with no acute findings, she denies any chest pain or shortness of breath  -Gust with her primary cardiologist Dr. Hyacinth Meeker in [place], apparently left bundle branch block presents since 2018  Discharge Diagnoses:   Principal Problem:    UTI (urinary tract infection)  Active Problems:    Essential hypertension    PAF (paroxysmal atrial fibrillation) (HCC)    GERD (gastroesophageal reflux disease)    Hyperlipidemia    Osteoporosis    Hyponatremia    AKI (acute kidney injury) (HCC)    Normocytic anemia    Hyperkalemia    Discharge Instructions    Discharge Instructions       Diet - low sodium heart healthy   Complete by: As directed     Discharge instructions   Complete by: As directed     Follow with Primary MD Alinda Deem, MD in 7 days     Get CBC, CMP, checked  by Primary MD next visit.     Activity:  As tolerated with Full fall precautions use walker/cane & assistance as needed    Disposition Home     Diet: Heart Healthy .    On your next visit with your primary care physician please Get Medicines reviewed and adjusted.    Please request your Prim.MD to go over all Hospital Tests and Procedure/Radiological results at the follow up, please get all Hospital records sent to your Prim MD by signing hospital release before you go home.    If you experience worsening of your admission symptoms, develop shortness of breath, life threatening emergency, suicidal or homicidal thoughts you must seek medical attention immediately  by calling 911 or calling your MD immediately  if symptoms less severe.    You Must read complete instructions/literature along with all the possible adverse reactions/side effects for all the Medicines you take and that have been prescribed to you. Take any new Medicines after you have completely understood and accpet all the possible adverse reactions/side effects.     Do not drive, operating heavy machinery, perform activities at heights, swimming or participation in water activities or provide baby sitting services if your were admitted for syncope or siezures until you have seen by Primary MD or a Neurologist and advised to do so again.    Do not drive when taking Pain medications.     Do not take more than prescribed Pain, Sleep and Anxiety Medications    Special Instructions: If you have smoked or chewed Tobacco  in the last 2 yrs please stop smoking, stop any regular Alcohol  and or any Recreational drug use.    Wear Seat belts while driving.    Please note    You were cared for by a hospitalist during your hospital stay. If you have any questions about your discharge medications or the care you received while you were in the hospital after you are discharged, you can call the unit and asked to speak with the hospitalist on call if the hospitalist that took care of you is not available. Once you are discharged, your primary care physician will handle any further medical issues. Please note that NO REFILLS for any discharge medications will be authorized once you are discharged, as it is imperative that you return to your primary care physician (or establish a relationship with a primary care physician if you do not have one) for your aftercare needs so that they can reassess your need for medications and monitor your lab values.    Increase activity slowly   Complete by: As directed          Allergies as of 01/07/2022         Reactions    Atorvastatin Itching           Medication List       STOP taking these  medications      ibuprofen 200 MG tablet  Commonly known as: ADVIL    olmesartan 40 MG tablet  Commonly known as: BENICAR          TAKE these medications      acetaminophen 500 MG tablet  Commonly known as: TYLENOL  Take 500 mg by mouth every 6 (six) hours as needed for mild pain.    aspirin 325 MG tablet  Take 325 mg by mouth daily.    bisacodyl 5 MG EC tablet  Generic drug: bisacodyl  1-2 daily as needed for constipation  What changed:   how much to take  how to  take this  when to take this  additional instructions    hydroxypropyl methylcellulose / hypromellose 2.5 % ophthalmic solution  Commonly known as: ISOPTO TEARS / GONIOVISC  Place 1 drop into both eyes daily as needed for dry eyes.    ICY HOT EX  Apply 1 application  topically daily as needed (ankle pain).    lovastatin 10 MG tablet  Commonly known as: MEVACOR  Take 10 mg by mouth at bedtime.    metoprolol tartrate 50 MG tablet  Commonly known as: LOPRESSOR  Take 1-1.5 tablets (50-75 mg total) by mouth See admin instructions. Take 1 tablet (50 mg) in the morning and then take 1/2 tablet (25 mg) at bedtime  What changed: additional instructions    pantoprazole 40 MG tablet  Commonly known as: PROTONIX  Daily while taking ibuprofen  What changed:   how much to take  how to take this  when to take this  additional instructions                  Durable Medical Equipment   (From admission, onward)             Start     Ordered    01/06/22 1349  For home use only DME 4 wheeled rolling walker with seat  Once        Question:  Patient needs a walker to treat with the following condition  Answer:  Weakness    01/06/22 1348                Follow-up Information       [facility] Follow up.    Why: Home Health has been arranged. They will contact you to schedule apt 48hrs post discharge.  Contact information:  [address]  [place] [address]  [phone]                    Allergies   Allergen Reactions    Atorvastatin Itching      Consultations:  None    Procedures/Studies:  ECHOCARDIOGRAM COMPLETE    Result Date: 01/05/2022     ECHOCARDIOGRAM REPORT   Patient Name:   RANA BUHAY Date of Exam: 01/05/2022 Medical Rec #:  [id number]        Height:       59.0 in Accession #:    [id number]       Weight:       140.0 lb Date of Birth:  1927/11/23        BSA:          1.585 m Patient Age:    88 years         BP:           148/76 mmHg Patient Gender: F                HR:           87 bpm. Exam Location:  Inpatient Procedure: 2D Echo and Intracardiac Opacification Agent Indications:    Heart Block, LBBB  History:        Patient has no prior history of Echocardiogram examinations.                 Arrythmias:Atrial Fibrillation and LBBB; Risk                 Factors:Hypertension and Dyslipidemia.  Sonographer:    Cathie Hoops Referring Phys: [phone] RALPH A NETTEY IMPRESSIONS  1. Left  ventricular ejection fraction, by estimation, is 50 to 55%. The left ventricle has low normal function. The left ventricle has no regional wall motion abnormalities. Left ventricular diastolic parameters are consistent with Grade I diastolic dysfunction (impaired relaxation).  2. Right ventricular systolic function is normal. The right ventricular size is normal. There is normal pulmonary artery systolic pressure. The estimated right ventricular systolic pressure is 28.2 mmHg.  3. Left atrial size was moderately dilated.  4. The mitral valve is normal in structure. Trivial mitral valve regurgitation. No evidence of mitral stenosis.  5. The aortic valve is tricuspid. There is mild calcification of the aortic valve. There is mild thickening of the aortic valve. Aortic valve regurgitation is not visualized. Aortic valve sclerosis is present, with no evidence of aortic valve stenosis.  6. The inferior vena cava is normal in size with greater than 50% respiratory variability, suggesting right atrial pressure of 3 mmHg. FINDINGS  Left Ventricle: Left ventricular ejection  fraction, by estimation, is 50 to 55%. The left ventricle has low normal function. The left ventricle has no regional wall motion abnormalities. Definity contrast agent was given IV to delineate the left ventricular endocardial borders. The left ventricular internal cavity size was normal in size. There is no left ventricular hypertrophy. Abnormal (paradoxical) septal motion, consistent with left bundle branch block. Left ventricular diastolic parameters  are consistent with Grade I diastolic dysfunction (impaired relaxation). Indeterminate filling pressures. Right Ventricle: The right ventricular size is normal. No increase in right ventricular wall thickness. Right ventricular systolic function is normal. There is normal pulmonary artery systolic pressure. The tricuspid regurgitant velocity is 2.51 m/s, and  with an assumed right atrial pressure of 3 mmHg, the estimated right ventricular systolic pressure is 28.2 mmHg. Left Atrium: Left atrial size was moderately dilated. Right Atrium: Right atrial size was normal in size. Pericardium: There is no evidence of pericardial effusion. Mitral Valve: The mitral valve is normal in structure. There is mild thickening of the mitral valve leaflet(s). Mild mitral annular calcification. Trivial mitral valve regurgitation. No evidence of mitral valve stenosis. Tricuspid Valve: The tricuspid valve is grossly normal. Tricuspid valve regurgitation is trivial. Aortic Valve: The aortic valve is tricuspid. There is mild calcification of the aortic valve. There is mild thickening of the aortic valve. Aortic valve regurgitation is not visualized. Aortic valve sclerosis is present, with no evidence of aortic valve stenosis. Aortic valve mean gradient measures 6.0 mmHg. Aortic valve peak gradient measures 9.1 mmHg. Aortic valve area, by VTI measures 3.26 cm. Pulmonic Valve: The pulmonic valve was grossly normal. Pulmonic valve regurgitation is trivial. Aorta: The aortic root and  ascending aorta are structurally normal, with no evidence of dilitation. Venous: The inferior vena cava is normal in size with greater than 50% respiratory variability, suggesting right atrial pressure of 3 mmHg. IAS/Shunts: The interatrial septum is aneurysmal. There is right bowing of the interatrial septum, suggestive of elevated left atrial pressure. No atrial level shunt detected by color flow Doppler.  LEFT VENTRICLE PLAX 2D LVIDd:         3.80 cm     Diastology LVIDs:         2.80 cm     LV e' medial:    6.96 cm/s LV PW:         1.00 cm     LV E/e' medial:  12.3 LV IVS:        1.00 cm     LV e' lateral:   7.40 cm/s  LVOT diam:     2.30 cm     LV E/e' lateral: 11.6 LV SV:         96 LV SV Index:   60 LVOT Area:     4.15 cm  LV Volumes (MOD) LV vol d, MOD A2C: 82.7 ml LV vol d, MOD A4C: 86.5 ml LV vol s, MOD A2C: 40.3 ml LV vol s, MOD A4C: 38.7 ml LV SV MOD A2C:     42.4 ml LV SV MOD A4C:     37.8 ml LV SV MOD BP:      46.2 ml RIGHT VENTRICLE RV Basal diam:  3.40 cm RV Mid diam:    3.50 cm RV S prime:     17.90 cm/s TAPSE (M-mode): 2.4 cm LEFT ATRIUM             Index        RIGHT ATRIUM           Index LA diam:        3.80 cm 2.40 cm/m   RA Area:     10.30 cm LA Vol (A2C):   55.1 ml 34.76 ml/m  RA Volume:   20.80 ml  13.12 ml/m LA Vol (A4C):   59.5 ml 37.54 ml/m LA Biplane Vol: 57.3 ml 36.15 ml/m  AORTIC VALVE                     PULMONIC VALVE AV Area (Vmax):    3.25 cm      PV Vmax:          1.28 m/s AV Area (Vmean):   3.15 cm      PV Peak grad:     6.6 mmHg AV Area (VTI):     3.26 cm      PR End Diast Vel: 2.92 msec AV Vmax:           151.00 cm/s AV Vmean:          113.000 cm/s AV VTI:            0.293 m AV Peak Grad:      9.1 mmHg AV Mean Grad:      6.0 mmHg LVOT Vmax:         118.00 cm/s LVOT Vmean:        85.600 cm/s LVOT VTI:          0.230 m LVOT/AV VTI ratio: 0.78  AORTA Ao Root diam: 3.40 cm Ao Asc diam:  2.50 cm MITRAL VALVE                TRICUSPID VALVE MV Area (PHT): 2.70 cm     TR Peak  grad:   25.2 mmHg MV Decel Time: 281 msec     TR Vmax:        251.00 cm/s MR Peak grad: 36.1 mmHg MR Vmax:      300.50 cm/s   SHUNTS MV E velocity: 85.70 cm/s   Systemic VTI:  0.23 m MV A velocity: 114.00 cm/s  Systemic Diam: 2.30 cm MV E/A ratio:  0.75 Mihai Croitoru MD Electronically signed by Thurmon Fair MD Signature Date/Time: 01/05/2022/4:48:37 PM    Final      CT ABDOMEN PELVIS W CONTRAST    Result Date: 01/04/2022  CLINICAL DATA:  Acute abdominal pain.  Weakness. EXAM: CT ABDOMEN AND PELVIS WITH CONTRAST TECHNIQUE: Multidetector CT imaging of the abdomen and pelvis was performed using the standard protocol following bolus administration  of intravenous contrast. RADIATION DOSE REDUCTION: This exam was performed according to the departmental dose-optimization program which includes automated exposure control, adjustment of the mA and/or kV according to patient size and/or use of iterative reconstruction technique. CONTRAST:  75mL OMNIPAQUE IOHEXOL 300 MG/ML  SOLN COMPARISON:  None Available. FINDINGS: Lower chest: No acute airspace disease or pleural effusion lesion. Heart is normal in size with coronary artery calcifications. There is a hiatal hernia. Hepatobiliary: No focal liver abnormality is seen. No gallstones, gallbladder wall thickening, or biliary dilatation. Pancreas: Age related parenchymal atrophy. No ductal dilatation or inflammation. Spleen: Normal in size without focal abnormality. Adrenals/Urinary Tract: Normal adrenal glands. Extrarenal pelvis configuration, right greater than left. No hydronephrosis. No perinephric edema. No suspicious renal lesion. Unremarkable urinary bladder. Stomach/Bowel: Small to moderate hiatal hernia. Stomach is decompressed. There is no small bowel obstruction or inflammation. The appendix is not visualized, appendectomy per history small to moderate volume of colonic stool. There is diverticulosis from the splenic flexure distally. There is equivocal colonic wall  thickening involving the junction of the descending and sigmoid colon, series 6, image 48 with mild adjacent mesenteric stranding. Vascular/Lymphatic: Moderate aortic atherosclerosis. Aortic tortuosity. No aneurysm. Patent portal vein. No enlarged lymph nodes in the abdomen or pelvis. Reproductive: Status post hysterectomy. No adnexal masses. Other: No free air or ascites. No abdominopelvic collection. Minimal fat in the inguinal canals. Musculoskeletal: Left hip arthroplasty. L3 compression fracture with vertebral plasty. Scoliosis with diffuse degenerative change in the spine. No acute osseous abnormalities are seen. IMPRESSION: 1. Equivocal short segment colonic wall thickening involving the junction of the descending and sigmoid colon with mild adjacent mesenteric stranding, suspicious for colitis. Recommend direct visualization with colonoscopy to exclude the possibility of underlying colonic neoplasm. 2. Left colonic diverticulosis without focal diverticulitis. 3. Small to moderate hiatal hernia. Aortic Atherosclerosis (ICD10-I70.0). Electronically Signed   By: Narda Rutherford M.D.   On: 01/04/2022 17:46     DG Chest 2 View    Result Date: 01/04/2022  CLINICAL DATA:  Per ED triage notes: "Patient here with daughter who reports prior to a week and a half ago patient was independent and able to care for herself, now patient is feeling globally weak, not able to ambulate independently, endorses chills, nausea, with emesis, dry heaving,abdominal pain and SHOB. Denies chest pain. Denies any recent falls." EXAM: CHEST - 2 VIEW COMPARISON:  None. FINDINGS: Cardiac silhouette borderline enlarged. No mediastinal or hilar masses. No evidence of adenopathy. Mild pleuroparenchymal scarring at the apices. Mild linear scarring or atelectasis at the left lung base. Lungs are hyperexpanded, otherwise clear. No pleural effusion or pneumothorax. Severe compression fracture of a midthoracic vertebra, likely chronic but of unclear  chronicity. No other fractures. Skeletal structures are demineralized. IMPRESSION: 1. No acute cardiopulmonary disease. 2. Severe compression fracture of a midthoracic vertebra of unclear chronicity. Electronically Signed   By: Amie Portland M.D.   On: 01/04/2022 15:24      Subjective:  No significant events overnight, she denies any complaints, she had good BM x2 yesterday    Discharge Exam:  Vitals:    01/06/22 2000 01/07/22 0816   BP: (!) 148/55 139/73   Pulse: 74    Resp:  18   Temp: 98.2 F (36.8 C) 98.1 F (36.7 C)   SpO2: 95% 95%     Vitals:    01/06/22 1131 01/06/22 1619 01/06/22 2000 01/07/22 0816   BP: (!) 118/48 (!) 99/59 (!) 148/55 139/73   Pulse: 68  71 74    Resp: 20 17  18    Temp: 98.4 F (36.9 C) 98.5 F (36.9 C) 98.2 F (36.8 C) 98.1 F (36.7 C)   TempSrc: Oral Oral Oral Oral   SpO2: 94% 92% 95% 95%   Weight:       Height:         General: Pt is alert, awake, not in acute distress  Cardiovascular: RRR, S1/S2 +, no rubs, no gallops  Respiratory: CTA bilaterally, no wheezing, no rhonchi  Abdominal: Soft, NT, ND, bowel sounds +  Extremities: no edema, no cyanosis    The results of significant diagnostics from this hospitalization (including imaging, microbiology, ancillary and laboratory) are listed below for reference.      Microbiology:  Recent Results (from the past 240 hour(s))   Urine Culture     Status: Abnormal    Collection Time: 01/04/22  1:03 PM    Specimen: Urine, Clean Catch   Result Value Ref Range Status    Specimen Description URINE, CLEAN CATCH  Final    Special Requests   Final     NONE  Performed at [facility], [address], [place], [place] [address]     Culture >=100,000 COLONIES/mL ESCHERICHIA COLI (A)  Final    Report Status 01/07/2022 FINAL  Final    Organism ID, Bacteria ESCHERICHIA COLI (A)  Final       Susceptibility    Escherichia coli - MIC*     AMPICILLIN 4 SENSITIVE Sensitive      CEFAZOLIN <=4 SENSITIVE Sensitive      CEFEPIME <=0.12 SENSITIVE Sensitive       CEFTRIAXONE <=0.25 SENSITIVE Sensitive      CIPROFLOXACIN <=0.25 SENSITIVE Sensitive      GENTAMICIN <=1 SENSITIVE Sensitive      IMIPENEM <=0.25 SENSITIVE Sensitive      NITROFURANTOIN <=16 SENSITIVE Sensitive      TRIMETH/SULFA <=20 SENSITIVE Sensitive      AMPICILLIN/SULBACTAM <=2 SENSITIVE Sensitive      PIP/TAZO <=4 SENSITIVE Sensitive      * >=100,000 COLONIES/mL ESCHERICHIA COLI   SARS Coronavirus 2 by RT PCR (hospital order, performed in [facility] hospital lab) *cepheid single result test* Anterior Nasal Swab     Status: None    Collection Time: 01/04/22  4:20 PM    Specimen: Anterior Nasal Swab   Result Value Ref Range Status    SARS Coronavirus 2 by RT PCR NEGATIVE NEGATIVE Final     Comment: (NOTE)  SARS-CoV-2 target nucleic acids are NOT DETECTED.    The SARS-CoV-2 RNA is generally detectable in upper and lower  respiratory specimens during the acute phase of infection. The lowest  concentration of SARS-CoV-2 viral copies this assay can detect is 250  copies / mL. A negative result does not preclude SARS-CoV-2 infection  and should not be used as the sole basis for treatment or other  patient management decisions.  A negative result may occur with  improper specimen collection / handling, submission of specimen other  than nasopharyngeal swab, presence of viral mutation(s) within the  areas targeted by this assay, and inadequate number of viral copies  (<250 copies / mL). A negative result must be combined with clinical  observations, patient history, and epidemiological information.    Fact Sheet for Patients:    RoadLapTop.co.za    Fact Sheet for Healthcare Providers:  http://kim-miller.com/    This test is not yet approved or  cleared by the  [place] [employer] and  has been authorized for detection and/or diagnosis of SARS-CoV-2 by  FDA under an TEFL teacher (EUA).  This EUA will remain  in effect (meaning this test can be used) for the  duration of the  COVID-19 declaration under Section 564(b)(1) of the Act, 21 U.S.C.  section 360bbb-3(b)(1), unless the authorization is terminated or  revoked sooner.    Performed at [facility], [address], [place], [place]  [address]        Labs:  BNP (last 3 results)  Recent Labs     01/04/22  1446   BNP 356.8*     Basic Metabolic Panel:  Recent Labs   Lab 01/04/22  1446 01/04/22  2022 01/05/22  0519 01/05/22  0530 01/06/22  0320   NA 129*  --  130*  --  129*   K 5.2*  --  4.5  --  4.4   CL 95*  --  98  --  98   CO2 22  --  21*  --  22   GLUCOSE 91  --  105*  --  98   BUN 22  --  14  --  14   CREATININE 1.31* 1.42* 1.19*  --  1.07*   CALCIUM 9.5  --  9.0  --  8.8*   MG  --   --   --  1.8  --      Liver Function Tests:  Recent Labs   Lab 01/04/22  1446 01/05/22  0519   AST 20 16   ALT 12 10   ALKPHOS 90 79   BILITOT 0.7 0.5   PROT 6.4* 5.9*   ALBUMIN 3.3* 2.8*     Recent Labs   Lab 01/04/22  1446   LIPASE 98*     No results for input(s): "AMMONIA" in the last 168 hours.  CBC:  Recent Labs   Lab 01/04/22  1446 01/04/22  2022 01/05/22  0519 01/06/22  0320   WBC 7.9 6.7 9.3 8.8   NEUTROABS 4.9  --   --   --    HGB 9.9* 9.4* 9.1* 9.8*   HCT 28.7* 27.7* 27.5* 29.2*   MCV 86.7 87.7 88.4 88.0   PLT 391 374 359 398     Cardiac Enzymes:  No results for input(s): "CKTOTAL", "CKMB", "CKMBINDEX", "TROPONINI" in the last 168 hours.  BNP:  Invalid input(s): "POCBNP"  CBG:  No results for input(s): "GLUCAP" in the last 168 hours.  D-Dimer  No results for input(s): "DDIMER" in the last 72 hours.  Hgb A1c  No results for input(s): "HGBA1C" in the last 72 hours.  Lipid Profile  No results for input(s): "CHOL", "HDL", "LDLCALC", "TRIG", "CHOLHDL", "LDLDIRECT" in the last 72 hours.  Thyroid function studies  No results for input(s): "TSH", "T4TOTAL", "T3FREE", "THYROIDAB" in the last 72 hours.    Invalid input(s): "FREET3"  Anemia work up  No results for input(s): "VITAMINB12", "FOLATE", "FERRITIN", "TIBC", "IRON",  "RETICCTPCT" in the last 72 hours.  Urinalysis    Component Value Date/Time    COLORURINE YELLOW 01/04/2022 1303    APPEARANCEUR CLOUDY (A) 01/04/2022 1303    LABSPEC 1.010 01/04/2022 1303    PHURINE 7.0 01/04/2022 1303    GLUCOSEU NEGATIVE 01/04/2022 1303    HGBUR NEGATIVE 01/04/2022 1303    BILIRUBINUR NEGATIVE 01/04/2022 1303    KETONESUR NEGATIVE 01/04/2022 1303    PROTEINUR NEGATIVE 01/04/2022 1303  NITRITE NEGATIVE 01/04/2022 1303    LEUKOCYTESUR LARGE (A) 01/04/2022 1303     Sepsis Labs  Recent Labs   Lab 01/04/22  1446 01/04/22  2022 01/05/22  0519 01/06/22  0320   WBC 7.9 6.7 9.3 8.8     Microbiology  Recent Results (from the past 240 hour(s))   Urine Culture     Status: Abnormal    Collection Time: 01/04/22  1:03 PM    Specimen: Urine, Clean Catch   Result Value Ref Range Status    Specimen Description URINE, CLEAN CATCH  Final    Special Requests   Final     NONE  Performed at [facility], [address] [place], [place] [address]     Culture >=100,000 COLONIES/mL ESCHERICHIA COLI (A)  Final    Report Status 01/07/2022 FINAL  Final    Organism ID, Bacteria ESCHERICHIA COLI (A)  Final       Susceptibility    Escherichia coli - MIC*     AMPICILLIN 4 SENSITIVE Sensitive      CEFAZOLIN <=4 SENSITIVE Sensitive      CEFEPIME <=0.12 SENSITIVE Sensitive      CEFTRIAXONE <=0.25 SENSITIVE Sensitive      CIPROFLOXACIN <=0.25 SENSITIVE Sensitive      GENTAMICIN <=1 SENSITIVE Sensitive      IMIPENEM <=0.25 SENSITIVE Sensitive      NITROFURANTOIN <=16 SENSITIVE Sensitive      TRIMETH/SULFA <=20 SENSITIVE Sensitive      AMPICILLIN/SULBACTAM <=2 SENSITIVE Sensitive      PIP/TAZO <=4 SENSITIVE Sensitive      * >=100,000 COLONIES/mL ESCHERICHIA COLI   SARS Coronavirus 2 by RT PCR (hospital order, performed in [facility] hospital lab) *cepheid single result test* Anterior Nasal Swab     Status: None    Collection Time: 01/04/22  4:20 PM    Specimen: Anterior Nasal Swab   Result Value Ref Range Status    SARS  Coronavirus 2 by RT PCR NEGATIVE NEGATIVE Final     Comment: (NOTE)  SARS-CoV-2 target nucleic acids are NOT DETECTED.    The SARS-CoV-2 RNA is generally detectable in upper and lower  respiratory specimens during the acute phase of infection. The lowest  concentration of SARS-CoV-2 viral copies this assay can detect is 250  copies / mL. A negative result does not preclude SARS-CoV-2 infection  and should not be used as the sole basis for treatment or other  patient management decisions.  A negative result may occur with  improper specimen collection / handling, submission of specimen other  than nasopharyngeal swab, presence of viral mutation(s) within the  areas targeted by this assay, and inadequate number of viral copies  (<250 copies / mL). A negative result must be combined with clinical  observations, patient history, and epidemiological information.    Fact Sheet for Patients:    RoadLapTop.co.za    Fact Sheet for Healthcare Providers:  http://kim-miller.com/    This test is not yet approved or  cleared by the [place] FDA and  has been authorized for detection and/or diagnosis of SARS-CoV-2 by  FDA under an Emergency Use Authorization (EUA).  This EUA will remain  in effect (meaning this test can be used) for the duration of the  COVID-19 declaration under Section 564(b)(1) of the Act, 21 U.S.C.  section 360bbb-3(b)(1), unless the authorization is terminated or  revoked sooner.    Performed at [facility], [address], [place], [place]  [address]      Time coordinating discharge: Over 30 minutes    SIGNED:    Huey Bienenstock, MD   [facility]  01/07/2022, 12:08 PM  Pager     If 7PM-7AM, please contact night-coverage  [email or web address]  Password [email or web address]  Electronically signed by Starleen Arms, MD at 01/07/2022 12:22 PM EDT

## 2022-01-07 NOTE — Unmapped (Signed)
Formatting of this note is different from the original.  Transition of Care [facility]) - CM/SW Discharge Note    Patient Details   Name: Mary Woodward  MRN: [id number]  Date of Birth: [birth year]    [facility]) CM/SW Contact:   Harriet Masson, RN  Phone Number:  01/07/2022, 12:39 PM    Clinical Narrative:       Patient stable for discharge. Cheryl with [facility] notified of discharge.   Patient has transportation home.   No other toc needs at this time.    Final next level of care: Home w Home Health Services  Barriers to Discharge: Barriers Resolved    Patient Goals and CMS Choice  Patient states their goals for this hospitalization and ongoing recovery are:: return home  CMS Medicare.gov Compare Post Acute Care list provided to:: Patient  Choice offered to / list presented to : Patient    Discharge Placement              home            Discharge Plan and Services    Discharge Planning Services: CM Consult  Post Acute Care Choice: Home Health, Durable Medical Equipment           DME Arranged: Walker rolling with seat  DME Agency: [employer]  Date DME Agency Contacted: 01/06/22  Time DME Agency Contacted: [phone]  Representative spoke with at DME Agency: Lawernce Keas  HH Arranged: PT, Nurse's Aide  HH Agency: [facility]  Date [facility] Agency Contacted: 01/06/22  Time HH Agency Contacted: [phone]  Representative spoke with at [facility] Agency: Elnita Maxwell    Social Determinants of Health (SDOH) Interventions      Readmission Risk Interventions      01/07/2022    12:38 PM   Readmission Risk Prevention Plan   Post Dischage Appt Complete   Medication Screening Complete   Transportation Screening Complete       Electronically signed by Harriet Masson, RN at 01/07/2022 12:40 PM EDT

## 2022-01-07 NOTE — Progress Notes (Signed)
Formatting of this note is different from the original.  Physical Therapy Treatment  Patient Details  Name: Mary Woodward  MRN: [id number]  DOB: 04/08/1928  Today's Date: 01/07/2022    History of Present Illness Pt is a 88 y/o female admitted secondary to increased weakness. Found to have UTI. PMH includes a fib, HTN, PE, and osteoporosis.      PT Comments      Pt received in supine, agreeable to therapy session and with good participation and tolerance for gait progression with rollator and safety instruction on use of brakes with device. Pt given gait belt and would benefit from further reinforcement on use of brakes with HHPT but good stability upon standing/sitting otherwise. Pt continues to benefit from PT services to progress toward functional mobility goals. Pt agreeabel to sit up in chair for lunch, purewick reapplied as per pt she has pre-existing urinary incontinence. Pt continues to benefit from PT services to progress toward functional mobility goals.    Recommendations for follow up therapy are one component of a multi-disciplinary discharge planning process, led by the attending physician.  Recommendations may be updated based on patient status, additional functional criteria and insurance authorization.    Follow Up Recommendations   Home health PT      Assistance Recommended at Discharge Frequent or constant Supervision/Assistance (initially)   Patient can return home with the following A little help with bathing/dressing/bathroom;Assistance with cooking/housework;Help with stairs or ramp for entrance;Assist for transportation    Equipment Recommendations   Rollator (4 wheels)     Recommendations for Other Services        Precautions / Restrictions Precautions  Precautions: Fall  Restrictions  Weight Bearing Restrictions: No       Mobility   Bed Mobility  Overal bed mobility: Needs Assistance  Bed Mobility: Supine to Sit      Supine to sit: Supervision      General bed mobility comments: flat bed, no  railing; increased time      Transfers  Overall transfer level: Needs assistance  Equipment used: Rollator (4 wheels)  Transfers: Sit to/from Stand  Sit to Stand: Supervision            General transfer comment: cues for safe UE placement and locking/unlocking rollator for safety needed; pt able to stand and sit with and without UE support and good eccentric control to sit      Ambulation/Gait  Ambulation/Gait assistance: Supervision  Gait Distance (Feet): 255 Feet  Assistive device: Rollator (4 wheels)  Gait Pattern/deviations: Step-through pattern, Decreased stride length  Gait velocity: variable; grossly 0.2-0.5 m/s      General Gait Details: cues for proximity to rollator and posture, activity pacing; no overt LOB, fair speed/cadence. standing break in hallway while pt waiting for rollator handle height to be adjusted no loss of balance unsupported;      Balance Overall balance assessment: Needs assistance    Sitting balance-Leahy Scale: Good        Standing balance-Leahy Scale: Fair  Standing balance comment: can release walker in standing unsupported for ~2 mins no LOB for static tasks                              Cognition Arousal/Alertness: Awake/alert  Behavior During Therapy: WFL for tasks assessed/performed  Overall Cognitive Status: Within Functional Limits for tasks assessed  General Comments: needs continued reminders for how to lock rollator, it was a new device for her so may need continued teaching.          Exercises        General Comments General comments (skin integrity, edema, etc.): BP 117/59 (76) supine; BP 145/65 (88) seated; HR 61-71 bpm with exertion        Pertinent Vitals/Pain Pain Assessment  Pain Assessment: No/denies pain     PT Goals (current goals can now be found in the care plan section) Acute Rehab PT Goals  Patient Stated Goal: to go home  PT Goal Formulation: With patient  Time For Goal Achievement: 01/19/22  Progress towards PT goals:  Progressing toward goals      Frequency     Min 3X/week      PT Plan Current plan remains appropriate;Equipment recommendations need to be updated       AM-PAC PT "6 Clicks" Mobility    Outcome Measure   Help needed turning from your back to your side while in a flat bed without using bedrails?: None  Help needed moving from lying on your back to sitting on the side of a flat bed without using bedrails?: A Little  Help needed moving to and from a bed to a chair (including a wheelchair)?: A Little  Help needed standing up from a chair using your arms (e.g., wheelchair or bedside chair)?: A Little  Help needed to walk in hospital room?: A Little  Help needed climbing 3-5 steps with a railing? : A Little  6 Click Score: 19      End of Session Equipment Utilized During Treatment: Gait belt  Activity Tolerance: Patient tolerated treatment well  Patient left: in chair;with call bell/phone within reach;with chair alarm set  Nurse Communication: Mobility status;Other (comment) (pt may need assist to complete her lunch order, she hasn't decided what to order yet. instructed on how to call but may need help.)  PT Visit Diagnosis: Unsteadiness on feet (R26.81);Muscle weakness (generalized) (M62.81);Difficulty in walking, not elsewhere classified (R26.2)      Time: 1204-1228  PT Time Calculation (min) (ACUTE ONLY): 24 min    Charges:  $Gait Training: 8-22 mins  $Therapeutic Activity: 8-22 mins               Carly P., PTA  Acute Rehabilitation Services  Secure Chat Preferred 9a-5:30pm  Office: [phone]     Angus Palms  01/07/2022, 1:30 PM    Electronically signed by Angus Palms, PTA at 01/07/2022  1:35 PM EDT

## 2022-04-02 IMAGING — DX DG ABDOMEN 2V
2 series · 2 of 2 positions shown · non-contrast
Comparison: None.

CLINICAL DATA: Epigastric pain, bloating

EXAM:
ABDOMEN - 2 VIEW

[abdomen erect]
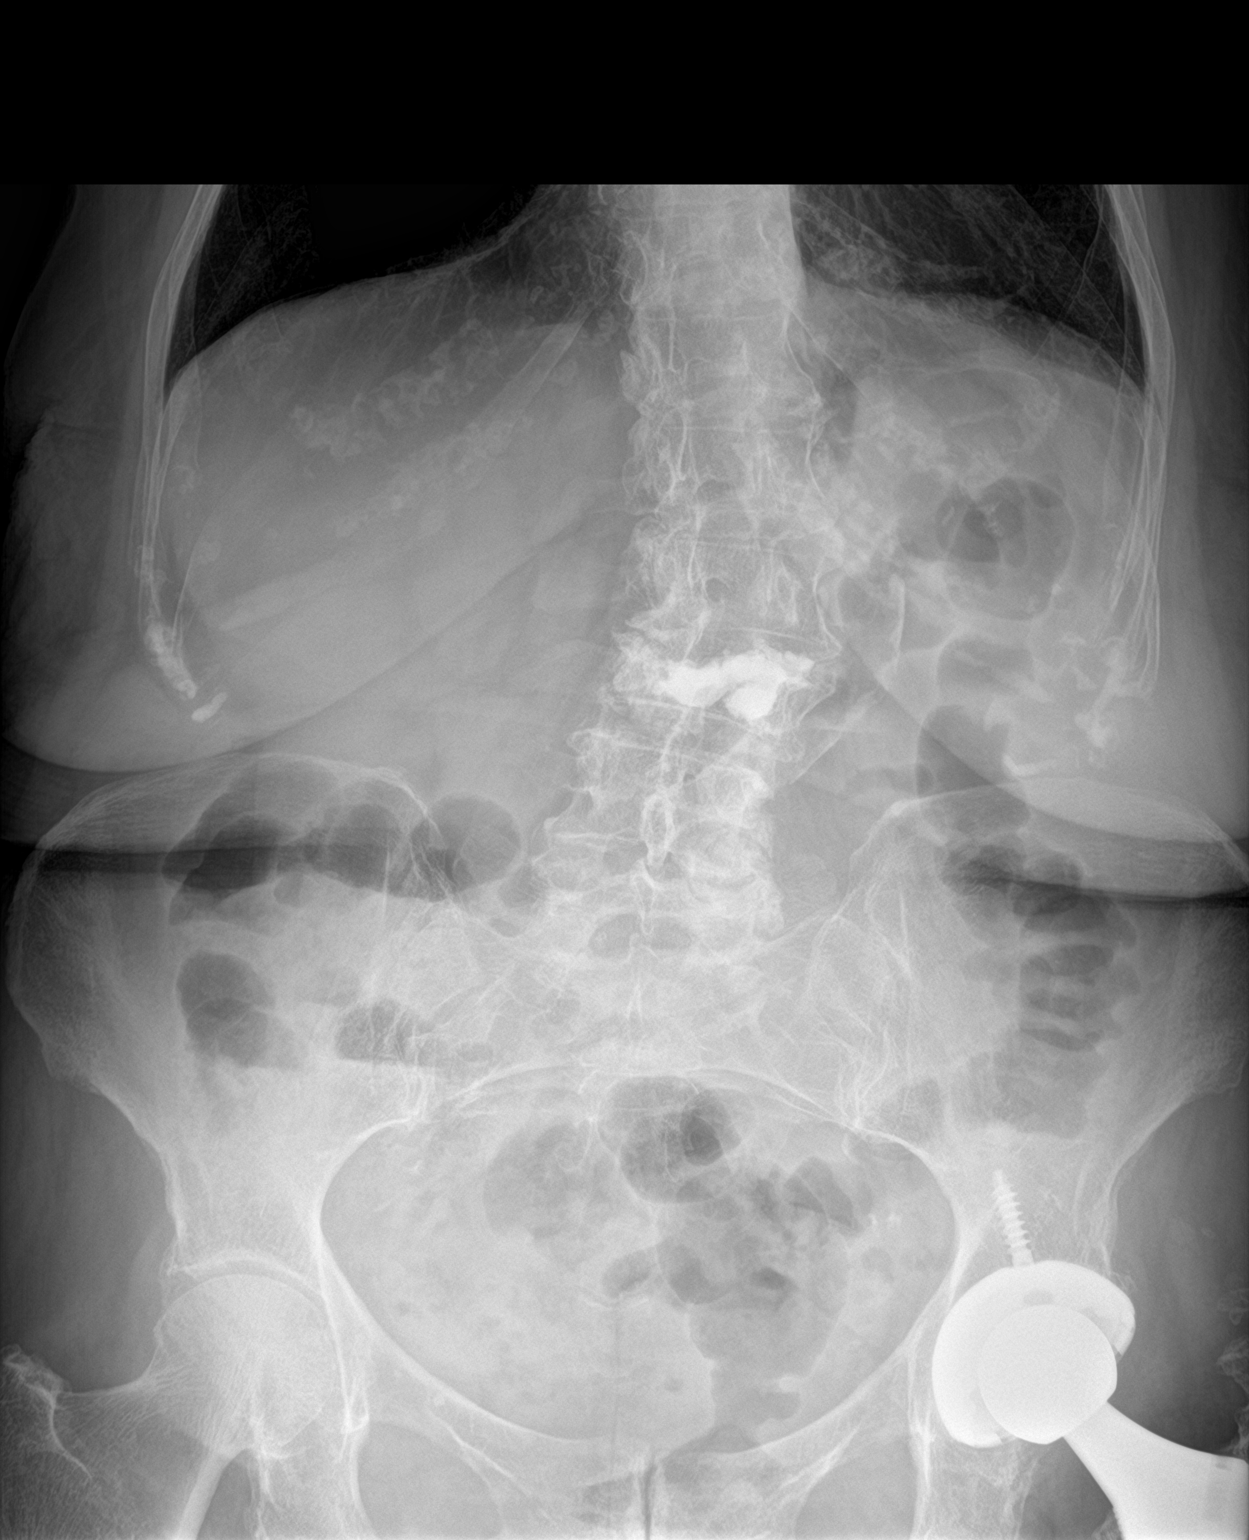

[abdomen supine]
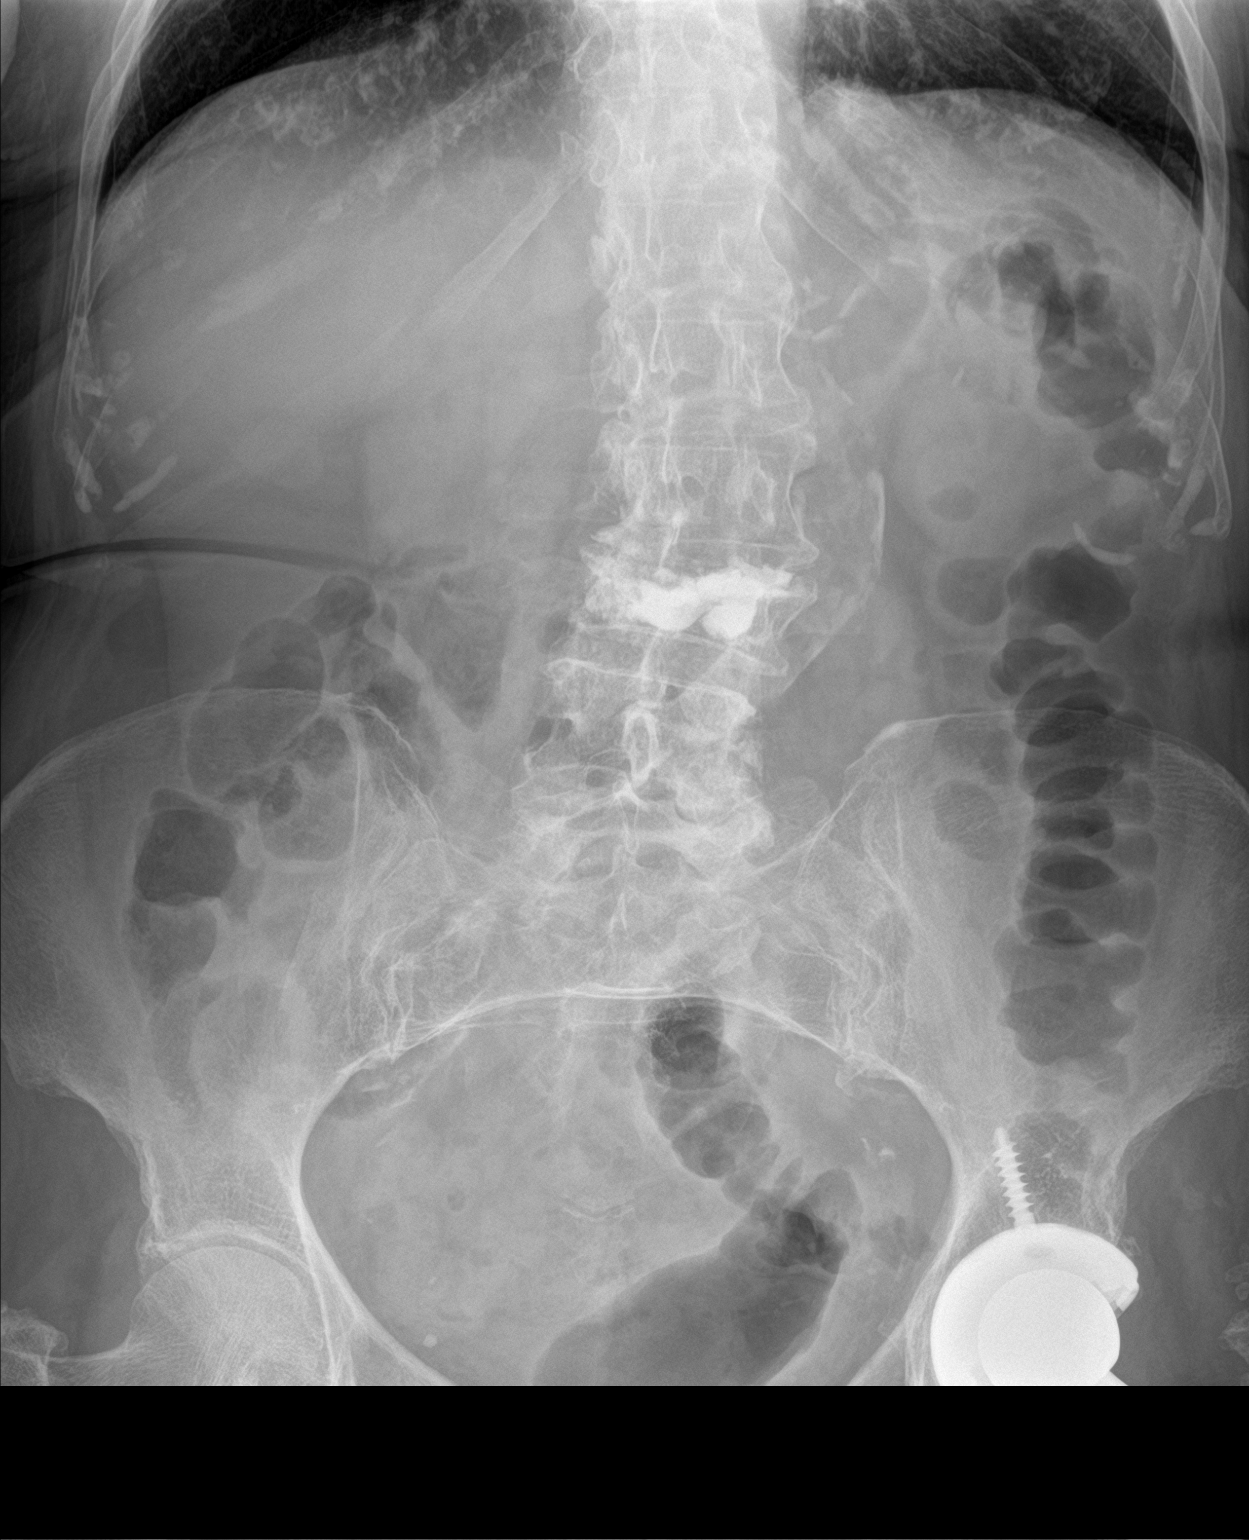

[2 of 2 positions shown; findings below may reference images not displayed]

FINDINGS: Nonobstructive bowel gas pattern. No organomegaly or free air. No
suspicious calcification. Diffuse aortic calcifications. No acute
bony abnormality.
IMPRESSION: No acute findings.

## 2022-09-17 ENCOUNTER — Emergency Department: Admit: 2022-09-17 | Payer: MEDICARE | Primary: Internal Medicine

## 2022-09-17 ENCOUNTER — Inpatient Hospital Stay
Admission: EM | Admit: 2022-09-17 | Discharge: 2022-09-20 | Disposition: A | Discharge status: Other Acute Inpatient Hospital | Payer: Medicare Other | Admitting: Internal Medicine

## 2022-09-17 DIAGNOSIS — I509 Heart failure, unspecified: Secondary | ICD-10-CM

## 2022-09-17 DIAGNOSIS — I130 Hypertensive heart and chronic kidney disease with heart failure and stage 1 through stage 4 chronic kidney disease, or unspecified chronic kidney disease: Secondary | ICD-10-CM

## 2022-09-17 LAB — ARTERIAL BLOOD GAS, POC
Base Deficit: 4.8 mmol/L
FIO2: 60 %
IPAP/PIP/High PEEP: 14
POC Allen's Test: POSITIVE
POC HCO3: 19.6 MMOL/L — ABNORMAL LOW (ref 22–26)
POC O2 SAT: 99.8 % — ABNORMAL HIGH (ref 92–97)
POC PEEP: 8 cmH2O
POC PO2: 235 MMHG — ABNORMAL HIGH (ref 80–100)
POC pCO2: 33 MMHG — ABNORMAL LOW (ref 35.0–45.0)
POC pH: 7.38 (ref 7.35–7.45)
Sed Rate: 14 {beats}/min

## 2022-09-17 LAB — COMPREHENSIVE METABOLIC PANEL
ALT: 16 U/L (ref 12–78)
AST: 18 U/L (ref 15–37)
Albumin/Globulin Ratio: 0.9 — ABNORMAL LOW (ref 1.1–2.2)
Albumin: 3.6 g/dL (ref 3.5–5.0)
Alk Phosphatase: 79 U/L (ref 45–117)
Anion Gap: 8 mmol/L (ref 5–15)
BUN/Creatinine Ratio: 28 — ABNORMAL HIGH (ref 12–20)
BUN: 41 MG/DL — ABNORMAL HIGH (ref 6–20)
CO2: 23 mmol/L (ref 21–32)
Calcium: 9.3 MG/DL (ref 8.5–10.1)
Chloride: 104 mmol/L (ref 97–108)
Creatinine: 1.46 MG/DL — ABNORMAL HIGH (ref 0.55–1.02)
Est, Glom Filt Rate: 34 mL/min/{1.73_m2} — ABNORMAL LOW (ref >60)
Globulin: 4.1 g/dL — ABNORMAL HIGH (ref 2.0–4.0)
Glucose: 136 mg/dL — ABNORMAL HIGH (ref 65–100)
Potassium: 4.2 mmol/L (ref 3.5–5.1)
Sodium: 135 mmol/L — ABNORMAL LOW (ref 136–145)
Total Bilirubin: 0.5 MG/DL (ref 0.2–1.0)
Total Protein: 7.7 g/dL (ref 6.4–8.2)

## 2022-09-17 LAB — RETICULOCYTES
Absolute Retic #: 0.051 M/ul (ref 0.0164–0.0776)
Reticulocyte Count,Automated: 1.2 % (ref 0.7–2.1)

## 2022-09-17 LAB — CBC WITH AUTO DIFFERENTIAL
Basophils %: 1 % (ref 0–1)
Basophils Absolute: 0 10*3/uL (ref 0.0–0.1)
Eosinophils %: 0 % (ref 0–7)
Eosinophils Absolute: 0 10*3/uL (ref 0.0–0.4)
Hematocrit: 34.7 % — ABNORMAL LOW (ref 35.0–47.0)
Hemoglobin: 11 g/dL — ABNORMAL LOW (ref 11.5–16.0)
Immature Granulocytes %: 0 % (ref 0.0–0.5)
Immature Granulocytes Absolute: 0 10*3/uL (ref 0.00–0.04)
Lymphocytes %: 21 % (ref 12–49)
Lymphocytes Absolute: 1.8 10*3/uL (ref 0.8–3.5)
MCH: 25.8 PG — ABNORMAL LOW (ref 26.0–34.0)
MCHC: 31.7 g/dL (ref 30.0–36.5)
MCV: 81.3 FL (ref 80.0–99.0)
MPV: 9.8 FL (ref 8.9–12.9)
Monocytes %: 9 % (ref 5–13)
Monocytes Absolute: 0.8 10*3/uL (ref 0.0–1.0)
Neutrophils %: 69 % (ref 32–75)
Neutrophils Absolute: 6 10*3/uL (ref 1.8–8.0)
Nucleated RBCs: 0 PER 100 WBC
Platelets: 416 10*3/uL — ABNORMAL HIGH (ref 150–400)
RBC: 4.27 M/uL (ref 3.80–5.20)
RDW: 16.4 % — ABNORMAL HIGH (ref 11.5–14.5)
WBC: 8.6 10*3/uL (ref 3.6–11.0)
nRBC: 0 10*3/uL (ref 0.00–0.01)

## 2022-09-17 LAB — IRON AND TIBC
Iron % Saturation: 11 % — ABNORMAL LOW (ref 20–50)
Iron: 47 ug/dL (ref 35–150)
TIBC: 447 ug/dL (ref 250–450)

## 2022-09-17 LAB — COVID-19 & INFLUENZA COMBO
Rapid Influenza A By PCR: NOT DETECTED
Rapid Influenza B By PCR: NOT DETECTED
SARS-CoV-2, PCR: NOT DETECTED

## 2022-09-17 LAB — EXTRA TUBES HOLD

## 2022-09-17 LAB — MAGNESIUM: Magnesium: 2.6 mg/dL — ABNORMAL HIGH (ref 1.6–2.4)

## 2022-09-17 LAB — EKG 12-LEAD
Atrial Rate: 109 {beats}/min
P Axis: 84 degrees
P-R Interval: 176 ms
Q-T Interval: 374 ms
QRS Duration: 128 ms
QTc Calculation (Bazett): 503 ms
R Axis: 18 degrees
T Axis: 145 degrees
Ventricular Rate: 109 {beats}/min

## 2022-09-17 LAB — FERRITIN: Ferritin: 21 NG/ML — ABNORMAL LOW (ref 26–388)

## 2022-09-17 LAB — TROPONIN: Troponin, High Sensitivity: 58 ng/L — ABNORMAL HIGH (ref 0–51)

## 2022-09-17 LAB — BRAIN NATRIURETIC PEPTIDE: NT Pro-BNP: 4123 PG/ML — ABNORMAL HIGH (ref <450)

## 2022-09-17 LAB — VITAMIN B12 & FOLATE
Folate: 32.4 ng/mL — ABNORMAL HIGH (ref 5.0–21.0)
Vitamin B-12: 482 pg/mL (ref 193–986)

## 2022-09-17 LAB — LACTIC ACID: Lactic Acid, Plasma: 2.1 MMOL/L (ref 0.4–2.0)

## 2022-09-17 MED ORDER — ENOXAPARIN SODIUM 60 MG/0.6ML IJ SOSY
60 | Freq: Every day | INTRAMUSCULAR | Status: DC
Start: 2022-09-17 — End: 2022-09-17
  Administered 2022-09-17: 11:00:00 60 mg/kg via SUBCUTANEOUS

## 2022-09-17 MED ORDER — ONDANSETRON HCL 4 MG/2ML IJ SOLN
42 MG/2ML | Freq: Four times a day (QID) | INTRAMUSCULAR | Status: DC | PRN
Start: 2022-09-17 — End: 2022-09-20

## 2022-09-17 MED ORDER — FUROSEMIDE 10 MG/ML IJ SOLN
10 | Freq: Two times a day (BID) | INTRAMUSCULAR | Status: DC
Start: 2022-09-17 — End: 2022-09-17

## 2022-09-17 MED ORDER — ACETAMINOPHEN 650 MG RE SUPP
650 MG | Freq: Four times a day (QID) | RECTAL | Status: DC | PRN
Start: 2022-09-17 — End: 2022-09-20

## 2022-09-17 MED ORDER — ONDANSETRON 4 MG PO TBDP
4 MG | Freq: Three times a day (TID) | ORAL | Status: DC | PRN
Start: 2022-09-17 — End: 2022-09-20

## 2022-09-17 MED ORDER — ASPIRIN 81 MG PO TBEC
81 MG | Freq: Every day | ORAL | Status: DC
Start: 2022-09-17 — End: 2022-09-20
  Administered 2022-09-18 – 2022-09-20 (×3): 81 mg via ORAL

## 2022-09-17 MED ORDER — FUROSEMIDE 10 MG/ML IJ SOLN
10 MG/ML | Freq: Two times a day (BID) | INTRAMUSCULAR | Status: AC
Start: 2022-09-17 — End: 2022-09-19
  Administered 2022-09-18 (×2): 40 mg via INTRAVENOUS

## 2022-09-17 MED ORDER — NORMAL SALINE FLUSH 0.9 % IV SOLN
0.9 % | Freq: Two times a day (BID) | INTRAVENOUS | Status: DC
Start: 2022-09-17 — End: 2022-09-20
  Administered 2022-09-17 – 2022-09-20 (×7): 10 mL via INTRAVENOUS

## 2022-09-17 MED ORDER — FUROSEMIDE 10 MG/ML IJ SOLN
10 | Freq: Once | INTRAMUSCULAR | Status: AC
Start: 2022-09-17 — End: 2022-09-17
  Administered 2022-09-17: 10:00:00 40 mg via INTRAVENOUS

## 2022-09-17 MED ORDER — ENOXAPARIN SODIUM 30 MG/0.3ML IJ SOSY
30 | Freq: Every day | INTRAMUSCULAR | Status: DC
Start: 2022-09-17 — End: 2022-09-17

## 2022-09-17 MED ORDER — SODIUM CHLORIDE 0.9 % IV SOLN
0.9 % | INTRAVENOUS | Status: DC | PRN
Start: 2022-09-17 — End: 2022-09-20

## 2022-09-17 MED ORDER — METOPROLOL SUCCINATE ER 25 MG PO TB24
25 MG | Freq: Every evening | ORAL | Status: DC
Start: 2022-09-17 — End: 2022-09-20
  Administered 2022-09-18 – 2022-09-19 (×2): 25 mg via ORAL

## 2022-09-17 MED ORDER — POLYETHYLENE GLYCOL 3350 17 G PO PACK
17 g | Freq: Every day | ORAL | Status: DC | PRN
Start: 2022-09-17 — End: 2022-09-20

## 2022-09-17 MED ORDER — ENOXAPARIN SODIUM 30 MG/0.3ML IJ SOSY
300.3 MG/0.3ML | Freq: Every day | INTRAMUSCULAR | Status: DC
Start: 2022-09-17 — End: 2022-09-20
  Administered 2022-09-18 – 2022-09-20 (×3): 30 mg via SUBCUTANEOUS

## 2022-09-17 MED ORDER — SODIUM CHLORIDE (PF) 0.9 % IJ SOLN
0.9 % | Freq: Every day | INTRAMUSCULAR | Status: AC
Start: 2022-09-17 — End: 2022-09-19
  Administered 2022-09-17 – 2022-09-18 (×2): 40 mg via INTRAVENOUS

## 2022-09-17 MED ORDER — ACETAMINOPHEN 325 MG PO TABS
325 MG | Freq: Four times a day (QID) | ORAL | Status: DC | PRN
Start: 2022-09-17 — End: 2022-09-20

## 2022-09-17 MED ORDER — NORMAL SALINE FLUSH 0.9 % IV SOLN
0.9 % | INTRAVENOUS | Status: DC | PRN
Start: 2022-09-17 — End: 2022-09-20

## 2022-09-17 MED FILL — MONOJECT FLUSH SYRINGE 0.9 % IV SOLN: 0.9 % | INTRAVENOUS | Qty: 40

## 2022-09-17 MED FILL — FUROSEMIDE 10 MG/ML IJ SOLN: 10 MG/ML | INTRAMUSCULAR | Qty: 4

## 2022-09-17 MED FILL — PANTOPRAZOLE SODIUM 40 MG IV SOLR: 40 MG | INTRAVENOUS | Qty: 40

## 2022-09-17 MED FILL — METOPROLOL SUCCINATE ER 25 MG PO TB24: 25 MG | ORAL | Qty: 1

## 2022-09-17 MED FILL — ENOXAPARIN SODIUM 60 MG/0.6ML IJ SOSY: 60 MG/0.6ML | INTRAMUSCULAR | Qty: 0.6

## 2022-09-17 NOTE — Progress Notes (Signed)
ABG on BiPap as documented:       Latest Reference Range & Units 09/17/22 04:48   POC pH 7.35 - 7.45   7.38   POC pCO2 35.0 - 45.0 MMHG 33.0 (L)   POC PO2 80 - 100 MMHG 235 (H)   POC HCO3 22 - 26 MMOL/L 19.6 (L)   POC O2 SAT 92 - 97 % 99.8 (H)   POC Allen's Test -   Positive   FIO2 % 60   POC PEEP cmH2O 8   (L): Data is abnormally low  (H): Data is abnormally high

## 2022-09-17 NOTE — ED Provider Notes (Signed)
[facility] EMERGENCY DEP  EMERGENCY DEPARTMENT ENCOUNTER      Pt Name: Mary Woodward  MRN: [id number]  Birthdate 1928/01/26  Date of evaluation: 09/17/2022  Provider: Lesia Sago, MD    CHIEF COMPLAINT       Chief Complaint   Patient presents with    Respiratory Distress    Chest Pain         HISTORY OF PRESENT ILLNESS   (Location/Symptom, Timing/Onset, Context/Setting, Quality, Duration, Modifying Factors, Severity)  Note limiting factors.   89 year old female presents from [name] nursing home via EMS with shortness of breath and chest pain.  According to EMS, patient woke up middle the night with cough and difficulty breathing.  They found her to be satting about 80% on room air.  There was audible wheezing.  EMS administered a DuoNeb and 10 mg of dexamethasone IV.  They also placed the patient on CPAP which brought her O2 sats up to 100%.  Patient has a history of atrial fibrillation and hypertension.  Denies any chronic lung disease.  No fever, vomiting.    The history is provided by the patient, the EMS personnel and medical records.         Review of External Medical Records:     Nursing Notes were reviewed.    REVIEW OF SYSTEMS    (2-9 systems for level 4, 10 or more for level 5)     Review of Systems   Constitutional:  Negative for fatigue.   HENT:  Negative for sore throat.    Eyes:  Negative for visual disturbance.   Respiratory:  Negative for cough.    Cardiovascular:  Negative for palpitations.   Gastrointestinal:  Negative for vomiting.   Genitourinary:  Negative for difficulty urinating.   Musculoskeletal:  Negative for myalgias.   Skin:  Negative for rash.   Neurological:  Negative for weakness.       Except as noted above the remainder of the review of systems was reviewed and negative.       PAST MEDICAL HISTORY   No past medical history on file.      SURGICAL HISTORY     No past surgical history on file.      CURRENT MEDICATIONS       Previous Medications    No medications on file       ALLERGIES      Patient has no known allergies.    FAMILY HISTORY     No family history on file.       SOCIAL HISTORY       Social History     Socioeconomic History    Marital status: Widowed           PHYSICAL EXAM    (up to 7 for level 4, 8 or more for level 5)     ED Triage Vitals   BP Temp Temp src Pulse Resp SpO2 Height Weight   -- -- -- -- -- -- -- --       Body mass index is 26.95 kg/m.    Physical Exam  HENT:      Head: Normocephalic.      Mouth/Throat:      Mouth: Mucous membranes are moist.   Eyes:      General: No scleral icterus.  Cardiovascular:      Rate and Rhythm: Tachycardia present.   Pulmonary:      Effort: Pulmonary effort is normal.      Breath sounds: Rhonchi  present.   Abdominal:      General: There is no distension.   Musculoskeletal:         General: No deformity.      Cervical back: Neck supple.   Skin:     Findings: No rash.   Neurological:      General: No focal deficit present.      Mental Status: She is alert and oriented to person, place, and time.         DIAGNOSTIC RESULTS     EKG: All EKG's are interpreted by the Emergency Department Physician who either signs or Co-signs this chart in the absence of a cardiologist.        RADIOLOGY:   Non-plain film images such as CT, Ultrasound and MRI are read by the radiologist. Plain radiographic images are visualized and preliminarily interpreted by the emergency physician with the below findings:        Interpretation per the Radiologist below, if available at the time of this note:    XR CHEST PORTABLE   Final Result   Congestive failure with interstitial pulmonary edema.           LABS:  Labs Reviewed   CBC WITH AUTO DIFFERENTIAL - Abnormal; Notable for the following components:       Result Value    Hemoglobin 11.0 (*)     Hematocrit 34.7 (*)     MCH 25.8 (*)     RDW 16.4 (*)     Platelets 416 (*)     All other components within normal limits   COMPREHENSIVE METABOLIC PANEL - Abnormal; Notable for the following components:    Sodium 135 (*)      Glucose 136 (*)     BUN 41 (*)     Creatinine 1.46 (*)     BUN/Creatinine Ratio 28 (*)     Est, Glom Filt Rate 34 (*)     Globulin 4.1 (*)     Albumin/Globulin Ratio 0.9 (*)     All other components within normal limits   MAGNESIUM - Abnormal; Notable for the following components:    Magnesium 2.6 (*)     All other components within normal limits   TROPONIN - Abnormal; Notable for the following components:    Troponin, High Sensitivity 58 (*)     All other components within normal limits   BRAIN NATRIURETIC PEPTIDE - Abnormal; Notable for the following components:    NT Pro-BNP 4,123 (*)     All other components within normal limits   ARTERIAL BLOOD GAS, POC - Abnormal; Notable for the following components:    POC pCO2 33.0 (*)     POC PO2 235 (*)     POC HCO3 19.6 (*)     POC O2 SAT 99.8 (*)     All other components within normal limits   CULTURE, BLOOD 1   CULTURE, BLOOD 2   COVID-19 & INFLUENZA COMBO   EXTRA TUBES HOLD   LACTIC ACID   LACTIC ACID   BLOOD GAS, ARTERIAL       All other labs were within normal range or not returned as of this dictation.    EMERGENCY DEPARTMENT COURSE and DIFFERENTIAL DIAGNOSIS/MDM:   Vitals:    Vitals:    09/17/22 0429 09/17/22 0445 09/17/22 0500   BP: (!) 155/86 (!) 167/83 (!) 152/83   Pulse: (!) 116 (!) 109 (!) 103   Resp: 26 26 17    Temp: 97.4 F (  36.3 C)     TempSrc: Oral     SpO2: 93% 92% 98%   Weight: 62.6 kg (138 lb)     Height: 1.524 m (5')             Medical Decision Making  Amount and/or Complexity of Data Reviewed  Labs: ordered.  Radiology: ordered.  ECG/medicine tests: ordered. Decision-making details documented in ED Course.    Risk  Prescription drug management.  Decision regarding hospitalization.            REASSESSMENT     ED Course as of 09/17/22 0521   Fri Sep 17, 2022   0441 EKG 12 Lead  ED ECG interpretation:  Rhythm: a. fib. Rate (approx.): 109.  Axis: normal.  ST segment:  No concerning ST elevations or depressions. LBBB. This EKG was independently  interpreted by Domingo Cocking, MD,ED Provider.   [JM]      ED Course User Index  [JM] Domingo Cocking, MD           CONSULTS:  None    PROCEDURES:  Unless otherwise noted below, none     Procedures      FINAL IMPRESSION      1. Acute congestive heart failure, unspecified heart failure type (HCC)    2. Acute pulmonary edema (HCC)    3. Acute respiratory failure with hypoxia (HCC)    4. Atrial fibrillation, unspecified type [facility])          DISPOSITION/PLAN   DISPOSITION Decision To Admit 09/17/2022 05:21:16 AM    Perfect Serve Consult for Admission  5:24 AM    ED Room Number: ER08/08  Patient Name and age:  Mary Woodward 89 y.o.  female  Working Diagnosis:   1. Acute congestive heart failure, unspecified heart failure type (HCC)    2. Acute pulmonary edema (HCC)    3. Acute respiratory failure with hypoxia (HCC)    4. Atrial fibrillation, unspecified type (HCC)        COVID-19 Suspicion: No  Sepsis present:  No  Reassessment needed: No  Code Status:  Full Code  Readmission: No  Isolation Requirements: no  Recommended Level of Care: step down  Department: [facility] Adult ED - [phone]  Consulting Provider:     Other:  pt from nursing home on BiPAP.  Woke up with CP and sob.  Satting 80% on room air.  Has h/o a. Fib but not CAD or CHF.  Trop=58, BNP=4,000.  + pulm edema on CXR.  No CP at this time.  Looking comfortable on BiPAP.       Total critical care time spent exclusive of procedures:  35 minutes.     PATIENT REFERRED TO:  No follow-up provider specified.    DISCHARGE MEDICATIONS:  New Prescriptions    No medications on file         (Please note that portions of this note were completed with a voice recognition program.  Efforts were made to edit the dictations but occasionally words are mis-transcribed.)    Lesia Sago, MD (electronically signed)  Emergency Attending Physician / Physician Assistant / Nurse Practitioner             Domingo Cocking, MD  09/17/22 [phone]

## 2022-09-17 NOTE — Other (Signed)
Heart Failure Nurse Navigator Chart Review performed. Echo pending.  Full note to follow.

## 2022-09-17 NOTE — ED Notes (Signed)
Breakfast tray delivered to patient. Patient states she has no appetite at this time.

## 2022-09-17 NOTE — ED Triage Notes (Signed)
Pt brought in by ambulance from [facility] w/ CC of SOB and substernal non radiating CP that awakened pt @ 3 am.  Pt was noted to have 02 sats in mid 70's per staff.  Pt was placed on NC and NRB per EMS w/o improvement in sats.  Pt then was placed on CPAP @ 100% with sats improving to 92-100% per medic.  Pt was given Albuterol and Atrovent via CPAP and Dexamethasone 10 mg IV enroute.  Pt changed over to Bipap upon arrival to ER 8.  Dr Marlene Bast to bedside upon arrival.

## 2022-09-17 NOTE — Care Coordination-Inpatient (Addendum)
Care Management Initial Assessment       RUR: 13%  Readmission? No  1st IM letter given? Yes - 09/17/22  Prior to Admission: [name] (main [phone]) independent apartment, receives all meals in dining hall, administers own medications, independent with ADLs, uses Rollator  SW at [employer]: Duwayne Heck [phone]; fax [phone]  Emergency Contact: daughter Kellie Simmering [phone]    Patient sent to ED by [name] for sats in 70s. Patient admitted with acute hypoxic respiratory failure, acute on chronic HF, and a-fib with RVR. Spoke with patient's RN. She just took patient off bi-pap.     9:00 AM Met with patient in room. Patient confirmed her address, phone number, and daughter's information. Will update PCP. Patient lives at [facility] since 1/24. Per patient, she lives in independent living. She is independent with her own ADLs and manages her own medications. Patient uses a Rollator for ambulation. She does not own any other DME. Patient goes to dining hall for lunch and dinner. They give her a breakfast bag a dinner to take to her apartment for mornings. Patient stated she is currently receiving PT on site for her knees. She would like to continue therapy there. Patient states she has an AMD and she thinks all three children are listed on it. Asked patient about going to healthcare at [facility] upon discharge. Patient stated she would not go to health care. She plans to return to her apartment. She gave permission for CM to call [name].    11:15 AM Called [name] [phone]). Registered [facility] confirmed patient is independent with administering her own medication. Spoke with Mirna Mires [phone]), director of social work for Independent and Assisted Living. Patient is in independent living. They do not currently have any beds in their healthcare unit. They are able to provide additional assistance to patient in her current apartment. They can  provide things like assistance with bathing, dressing, meals. They are not able to assist with toileting or transferring every 2 hours. She will speak with patient's daughter [name] about the services they can provide patient. She will need clinicals faxed to her at [phone] prior to patient returning. They will not be able to accept her to a new level over the weekend.        09/17/22 1043   Service Assessment   Patient Orientation Alert and Oriented   Cognition Alert   History Provided By Patient   Primary Caregiver Self   Support Systems Other (Comment);Children  (children, [name] Independent living)   Patient's Healthcare Decision Maker is: Armed forces operational officer Next of Kin   PCP Verified by CM Yes  (Dr. Renato Battles)   Last Visit to PCP Within last 3 months   Prior Functional Level Independent in ADLs/IADLs   Can patient return to prior living arrangement Yes   Ability to make needs known: Good   Family able to assist with home care needs: Yes   Would you like for me to discuss the discharge plan with any other family members/significant others, and if so, who? Yes  (daughter Kellie Simmering, son Niti Kohtz)   Financial Resources [employer] Resources Other (Comment)  (Independent apartment at [employer])   Social/Functional History   Lives With Alone   Type of Home Apartment   Home Layout One level   Home Access Level Personnel officer  (Always uses rollator.)   ADL Assistance Independent   Ambulation Assistance Independent  Copy No   Patient's Driver Info daughter   Dealer   Occupation Retired   Building control surveyor   Type of Residence Independent Living;Apartment   Living Arrangements Alone   Current Services Prior To Admission Other (Comment)  (receives meals from dining hall at [employer])   Potential Assistance Needed Outpatient PT/OT  (currently open to PT at [employer])   DME Ordered? No   Potential  Assistance Purchasing Medications No   Patient expects to be discharged to: Apartment   Services At/After Discharge   Services At/After Discharge PT   [facility] Information Provided? No   Mode of Transport at Discharge Other (see comment)  (family)   Confirm Follow Up Transport Family   Condition of Participation: Discharge Planning   The Plan for Transition of Care is related to the following treatment goals: therapy on site at [facility]   The Patient and/or Patient Representative was provided with a Choice of Provider? Patient   The Patient and/Or Patient Representative agree with the Discharge Plan? Yes   Freedom of Choice list was provided with basic dialogue that supports the patient's individualized plan of care/goals, treatment preferences, and shares the quality data associated with the providers?  No  (Patient is already open to services and wishes to continue with the same services.)     Joci Dress E Mardella Nuckles, MSW

## 2022-09-17 NOTE — Consults (Signed)
[facility] Cardiology Consult Note    Date of consult:  09/17/22  Date of admission: 09/17/2022  Primary Cardiologist: Dr Tommy Medal  Physician Requesting consult: Dr Para March    CC / Reason for consult: a.fib RVR, CHF  Chief Complaint   Patient presents with    Respiratory Distress    Chest Pain        History of the presenting illness:  Mary Woodward is a 89 y.o. F who presented to the [facility] ER overnight with shortness of breath, chest pain, and hypoxia.   She was noted to be hypoxic at the nursing home. She was on BIPAP for a while, back to NC oxygen now.   CXR showed some pulmonary edema.     Dr Dory Peru saw her in the office about 1 month ago.   She has a h/o PAF and PE.  Was on anticoagulation at some point, but that was discontinued due to falls.   Pros and cons of re-trial of AC vs Watchman were discussed and they wanted to think about it.     Medical History:  PAF  HTN  LBBB    Prior to Admission medications    Medication Sig Start Date End Date Taking? Authorizing Provider   aspirin 81 MG chewable tablet Take 1 tablet by mouth daily   Yes [provider]   lovastatin (MEVACOR) 10 MG tablet Take 1 tablet by mouth nightly   Yes [provider]   metoprolol succinate (TOPROL XL) 25 MG extended release tablet Take 1 tablet by mouth every evening   Yes [provider]   pantoprazole (PROTONIX) 40 MG injection Infuse 40 mg intravenously 2 times daily   Yes [provider]   Multiple Vitamins-Minerals (PRESERVISION AREDS 2) CHEW Take 1 tablet by mouth in the morning and at bedtime   Yes [provider]       No Known Allergies     No family history on file.  No pertinent family history    Social History     Socioeconomic History    Marital status: Widowed     Spouse name: Not on file    Number of children: Not on file    Years of education: Not on file    Highest education level: Not on file   Occupational History    Not on file   Tobacco Use    Smoking status: Not on file     Smokeless tobacco: Not on file   Substance and Sexual Activity    Alcohol use: Not on file    Drug use: Not on file    Sexual activity: Not on file   Other Topics Concern    Not on file   Social History Narrative    Not on file     Social Determinants of Health     Financial Resource Strain: Not on file   Food Insecurity: Not on file   Transportation Needs: Not on file   Physical Activity: Not on file   Stress: Not on file   Social Connections: Not on file   Intimate Partner Violence: Not on file   Housing Stability: Not on file       Review of Systems   Constitutional:  Negative for chills and fever.   HENT:  Negative for rhinorrhea.    Eyes:  Negative for visual disturbance.   Respiratory:  Positive for chest tightness and shortness of breath. Negative for cough.    Cardiovascular:  Positive for chest pain and palpitations. Negative for leg swelling.   Gastrointestinal:  Negative for abdominal pain, nausea and vomiting.   Genitourinary:  Negative for dysuria and frequency.   Musculoskeletal:  Negative for joint swelling and myalgias.   Neurological:  Negative for dizziness, weakness, numbness and headaches.   Psychiatric/Behavioral:  Negative for confusion. The patient is not nervous/anxious.          Vitals:    09/17/22 1200   BP: 123/64   Pulse: 89   Resp: 22   Temp:    SpO2: 92%     Physical Exam  Vitals reviewed.   Constitutional:       General: She is not in acute distress.     Interventions: Nasal cannula in place.   HENT:      Head: Normocephalic.   Eyes:      General: No scleral icterus.     Conjunctiva/sclera: Conjunctivae normal.   Cardiovascular:      Rate and Rhythm: Normal rate and regular rhythm.      Heart sounds: Normal heart sounds. No murmur heard.  Pulmonary:      Effort: Pulmonary effort is normal.      Breath sounds: Normal breath sounds.   Abdominal:      General: Abdomen is flat.      Tenderness: There is no abdominal tenderness.   Musculoskeletal:         General: No swelling or deformity.    Skin:     General: Skin is warm and dry.   Neurological:      General: No focal deficit present.      Mental Status: She is alert. Mental status is at baseline.   Psychiatric:         Mood and Affect: Mood normal.         Behavior: Behavior normal.         Lab review:  BMP:   Recent Labs     09/17/22  0437   NA 135*   K 4.2   CL 104   CO2 23   BUN 41*   CREATININE 1.46*   GLUCOSE 136*   CALCIUM 9.3        CBC:  Lab Results   Component Value Date/Time    WBC 8.6 09/17/2022 04:37 AM    HGB 11.0 09/17/2022 04:37 AM    HCT 34.7 09/17/2022 04:37 AM    PLT 416 09/17/2022 04:37 AM    MCV 81.3 09/17/2022 04:37 AM       Data review:  EKG tracing personally reviewed:   Likely sinus tachy with PACs, LBBB    Telemetry:  More clearly shows sinus with frequent PACs  Could be multifocal atrial tachycardia, as multiple P wave morphologies noted.      Echocardiogram:  Echo Sept 2023   1. Left ventricular ejection fraction, by estimation, is 50 to 55%. The left ventricle has low normal function. The left ventricle has no regional wall motion abnormalities. Left ventricular diastolic parameters are consistent with Grade I diastolic   dysfunction (impaired relaxation).    2. Right ventricular systolic function is normal. The right ventricular size is normal. There is normal pulmonary artery systolic pressure. The estimated right ventricular systolic pressure is 28.2 mmHg.    3. Left atrial size was moderately dilated.    4. The mitral valve is normal in structure. Trivial mitral valve regurgitation. No evidence of mitral stenosis.    5. The aortic valve is  tricuspid. There is mild calcification of the aortic valve. There is mild thickening of the aortic valve. Aortic valve regurgitation is not visualized. Aortic valve sclerosis is present, with no evidence of aortic valve stenosis.    6. The inferior vena cava is normal in size with greater than 50% respiratory variability, suggesting right atrial pressure of 3 mmHg.     Chest  XRAy  FINDINGS: Cardiac monitoring leads are noted. The cardiac silhouette is within  normal limits.     There is pulmonary vascular congestion with diffuse interstitial prominence and  no pleural effusion or pneumothorax. The visualized bones and upper abdomen are  age-appropriate.     IMPRESSION:  Congestive failure with interstitial pulmonary edema.      Assessment & Recommendations / Plan:    Acute respiratory failure  Improving    Acute diastolic CHF  Agree with IV lasix    Paroxysmal afib: currently sinus with PACs  Not on anticoagulation due to fall risk  Continue PO metoprolol      Signed:  Myleigh [facility]  Interventional Cardiology  09/17/22

## 2022-09-17 NOTE — ED Notes (Signed)
RN removed BiPAP after verbal telephone order from MD Madala. Patient placed on 6L NC.

## 2022-09-17 NOTE — Progress Notes (Signed)
[facility]                                                                                          Hospitalist Progress Note  Ronnie Derby, MD  Office Phone: [phone]        Date of Service:  09/17/2022  NAME:  Mary Woodward  DOB:  05-Aug-1927  MRN:  [id number]       Admission Summary:   British Weinberg is a 89 y.o. female with a pmhx atrial fibrillation, dyslipidemia, hypertension, past PE, osteoarthritis, and osteoporosis who presents from the [facility] nursing home with shortness of breath, and chest pain, and is being admitted for acute hypoxic respiratory failure secondary to acute heart failure, and A-fib with RVR.     She woke up at her facility short of breath, and was found to have SpO2 80% when EMS arrived.  They gave her Decadron, DuoNeb, and placed on CPAP, then transferred to [facility] ED for further evaluation.     In the ED, she was tachycardic to 116, with SpO2 93% on CPAP.  She was placed on BiPAP with FiO2 45.  Labs showed normocytic anemia with hemoglobin 11, BUN 41, creatinine 1.46, troponin 58, and proBNP 4123.  pO2 on ABG was 235 on BiPAP.  Chest x-ray shows congestive heart failure with interstitial pulmonary edema.     In the ED, she received IV Lasix 40 mg.       Interval history / Subjective:   Patient is seen and examined at bedside this noon in ED. Son in law present. She feels better, sob improving. On 6l NC. Explained likely due to CHF- echo and cardiology eval today   Goals of care discussed - explained in detail - pt agreed to DNR        Assessment & Plan:      #Acute Hypoxic respiratory failure - improving   #Acute Diastolic heart failure   - CXR: Congestive failure with interstitial pulmonary edema.   - Flu and covid- negative.   - off BiPAP   - Strict I&O, and daily weight  - appreciate cardiology input   - echo pending   - c/w IV lasix   - saturating well on 6l NC, will try to wean off     #pAF with RVR  -continue metoprolol for  rate control   -chads vasc >2  - not on anticoagulation due to falls      #mild normocytic anemia  -Hgb 11, no baseline for comparison  -anemia w/u including stool occult     # Elevated creatinine, acute vs. CKD  -creatinine 1.46  -avoid renal toxins, and renally dose medications  -monitor renal function      #Dyslipidemia  -continue statin per hospital formulary     # Hx  PE  -not on anticoagulation     #GERD   -continue PPI     Code status: DNR  Prophylaxis: lovenox   Care Plan discussed with: pt/ family   Anticipated Disposition: TBD  Inpatient  Cardiac monitoring: Telemetry  Central Line:  Social Determinants of Health     Tobacco Use: Not on file   Alcohol Use: Not At Risk (09/17/2022)    AUDIT-C     Frequency of Alcohol Consumption: Never     Average Number of Drinks: Patient does not drink     Frequency of Binge Drinking: Never   Financial Resource Strain: Not on file   Food Insecurity: Not on file   Transportation Needs: Not on file   Physical Activity: Not on file   Stress: Not on file   Social Connections: Not on file   Intimate Partner Violence: Not on file   Depression: Not on file   Housing Stability: Not on file   Interpersonal Safety: Patient Unable To Answer (09/17/2022)    Interpersonal Safety Domain Source: IP Abuse Screening     Physical abuse: Unable to assess     Verbal abuse: Unable to assess     Emotional abuse: Not on file     Financial abuse: Not on file     Sexual abuse: Not on file   Utilities: Not on file       Review of Systems:   A comprehensive review of systems was negative.       Vital Signs:    Last 24hrs VS reviewed since prior progress note. Most recent are:  Vitals:    09/17/22 1300   BP: 116/64   Pulse: 81   Resp: 20   Temp:    SpO2: 95%       No intake or output data in the 24 hours ending 09/17/22 1444     Physical Examination:     I had a face to face encounter with this patient and independently examined them on 09/17/2022 as outlined below:          General : alert x  3, awake, no acute distress, elderly   HEENT: PEERL, EOMI, moist mucus membrane  Neck: supple, no JVD, no meningeal signs  Chest: Clear to auscultation bilaterally   CVS: S1 S2 heard, Capillary refill less than 2 seconds  Abd: soft/ non tender, non distended, BS physiological,   Ext: no clubbing, no cyanosis, no edema, brisk 2+ DP pulses  Neuro/Psych: pleasant mood and affect, CN 2-12 grossly intact, sensory grossly within normal limit, Strength 5/5 in all extremities  Skin: warm     Data Review:    I personally reviewed  Image      I have personally and independently reviewed all pertinent labs, diagnostic studies, imaging, and have provided independent interpretation of the same.     Labs:     Recent Labs     09/17/22  0437   WBC 8.6   HGB 11.0*   HCT 34.7*   PLT 416*     Recent Labs     09/17/22  0437   NA 135*   K 4.2   CL 104   CO2 23   BUN 41*   MG 2.6*     Recent Labs     09/17/22  0437   ALT 16   GLOB 4.1*     No results for input(s): "INR", "APTT" in the last 72 hours.    Invalid input(s): "PTP"   Recent Labs     09/17/22  0437   TIBC 447      No results found for: "RBCF"   No results for input(s): "PH", "PCO2", "PO2" in the last 72 hours.  No results for input(s): "CPK" in the  last 72 hours.    Invalid input(s): "CPKMB", "CKNDX", "TROIQ"  No results found for: "CHOL", "CHLST", "CHOLV", "HDL", "HDLC", "LDL"  No results found for: "GLUCPOC"  @LABUA @    Notes reviewed from all clinical/nonclinical/nursing services involved in patient's clinical care. Care coordination discussions were held with appropriate clinical/nonclinical/ nursing providers based on care coordination needs.         Patients current active Medications were reviewed, considered, added and adjusted based on the clinical condition today.      Home Medications were reconciled to the best of my ability given all available resources at the time of admission. Route is PO if not otherwise noted.      Admission  Status:[id number]:::1}      Medications Reviewed:     Current Facility-Administered Medications   Medication Dose Route Frequency    sodium chloride flush 0.9 % injection 5-40 mL  5-40 mL IntraVENous 2 times per day    sodium chloride flush 0.9 % injection 5-40 mL  5-40 mL IntraVENous PRN    0.9 % sodium chloride infusion   IntraVENous PRN    ondansetron (ZOFRAN-ODT) disintegrating tablet 4 mg  4 mg Oral Q8H PRN    Or    ondansetron (ZOFRAN) injection 4 mg  4 mg IntraVENous Q6H PRN    polyethylene glycol (GLYCOLAX) packet 17 g  17 g Oral Daily PRN    acetaminophen (TYLENOL) tablet 650 mg  650 mg Oral Q6H PRN    Or    acetaminophen (TYLENOL) suppository 650 mg  650 mg Rectal Q6H PRN    metoprolol succinate (TOPROL XL) extended release tablet 25 mg  25 mg Oral QPM    pantoprazole (PROTONIX) 40 mg in sodium chloride (PF) 0.9 % 10 mL injection  40 mg IntraVENous Daily    aspirin EC tablet 81 mg  81 mg Oral Daily    furosemide (LASIX) injection 40 mg  40 mg IntraVENous BID    enoxaparin (LOVENOX) injection 60 mg  1 mg/kg SubCUTAneous Daily     Current Outpatient Medications   Medication Sig    aspirin 81 MG chewable tablet Take 1 tablet by mouth daily    lovastatin (MEVACOR) 10 MG tablet Take 1 tablet by mouth nightly    metoprolol succinate (TOPROL XL) 25 MG extended release tablet Take 1 tablet by mouth every evening    pantoprazole (PROTONIX) 40 MG injection Infuse 40 mg intravenously 2 times daily    Multiple Vitamins-Minerals (PRESERVISION AREDS 2) CHEW Take 1 tablet by mouth in the morning and at bedtime     ______________________________________________________________________  EXPECTED LENGTH OF STAY: 3  ACTUAL LENGTH OF STAY:          0                 Ronnie Derby, MD

## 2022-09-17 NOTE — Discharge Instructions (Addendum)
To access the American Heart Association's Interactive Workbook "Healthier Living with Heart Failure - Managing Symptoms and Reducing Risk"  Scan the QR code below.                              Discharge Instructions       PATIENT ID: Mary Woodward  MRN: [id number]   DATE OF BIRTH: Jan 17, 1928    DATE OF ADMISSION: @ADMITDTTM @    DATE OF DISCHARGE: 09/20/2022    PRIMARY CARE PROVIDER: @PCP @     ATTENDING PHYSICIAN: @ATTPROV @  DISCHARGING PROVIDER: Ronnie Derby, MD    To contact this individual call [phone] and ask the operator to page.   If unavailable ask to be transferred the Adult Hospitalist Department.    DISCHARGE DIAGNOSES Congestive heart failure exacerbation     CONSULTATIONS: @CONORDS @    PROCEDURES/SURGERIES: * No surgery found *    PENDING TEST RESULTS:   At the time of discharge the following test results are still pending: none     FOLLOW UP APPOINTMENTS:   @DCFOLLOWUP @   PCP in 1-2 weeks   Cardiology in 2 weeks     ADDITIONAL CARE RECOMMENDATIONS:   Daily weight   Labs- BMP in 1 week     DIET: cardiac diet, 1800- fluids per day   Oral Nutritional Supplements:    ACTIVITY: activity as tolerated    Radiology      DISCHARGE MEDICATIONS:   See Medication Reconciliation Form    It is important that you take the medication exactly as they are prescribed.   Keep your medication in the bottles provided by the pharmacist and keep a list of the medication names, dosages, and times to be taken in your wallet.   Do not take other medications without consulting your doctor.       NOTIFY YOUR PHYSICIAN FOR ANY OF THE FOLLOWING:   Fever over 101 degrees for 24 hours.   Chest pain, shortness of breath, fever, chills, nausea, vomiting, diarrhea, change in mentation, falling, weakness, bleeding. Severe pain or pain not relieved by medications.  Or, any other signs or symptoms that you may have questions about.      DISPOSITION:  X  Home With:   OT  PT X HH  RN       SNF/Inpatient Rehab/LTAC     Independent/assisted living    Hospice    Other:       Signed:   Ronnie Derby, MD  09/20/2022  10:01 AM

## 2022-09-17 NOTE — ED Notes (Signed)
RT contacted to try patient off of bipap

## 2022-09-17 NOTE — ED Notes (Signed)
Bedside and Verbal shift change report given to Sarah  (Cabin crew) by Jon Gills RN (offgoing nurse). Report included the following information Nurse Handoff Report and ED SBAR.

## 2022-09-17 NOTE — H&P (Signed)
History and Physical    Date of Service:  09/17/2022  Primary Care Provider: No primary care provider on file.  Source of information: patient, family member - daughter    Chief Complaint: Respiratory Distress and Chest Pain      History of Presenting Illness:   Mary Woodward is a 89 y.o. female with a pmhx atrial fibrillation, dyslipidemia, hypertension, past PE, osteoarthritis, and osteoporosis who presents from the [facility] nursing home with shortness of breath, and chest pain, and is being admitted for acute hypoxic respiratory failure secondary to acute heart failure, and A-fib with RVR.    She woke up at her facility short of breath, and was found to have SpO2 80% when EMS arrived.  They gave her Decadron, DuoNeb, and placed on CPAP, then transferred to [facility] ED for further evaluation.    In the ED, she was tachycardic to 116, with SpO2 93% on CPAP.  She was placed on BiPAP with FiO2 45.  Labs showed normocytic anemia with hemoglobin 11, BUN 41, creatinine 1.46, troponin 58, and proBNP 4123.  pO2 on ABG was 235 on BiPAP.  Chest x-ray shows congestive heart failure with interstitial pulmonary edema.    In the ED, she received IV Lasix 40 mg.     REVIEW OF SYSTEMS:  A comprehensive review of systems was negative except for that written in the History of Present Illness.     No past medical history on file.   No past surgical history on file.  Prior to Admission medications    Not on File     No Known Allergies   No family history on file.   Social History:     Social Determinants of Health     Tobacco Use: Not on file   Alcohol Use: Not on file   Financial Resource Strain: Not on file   Food Insecurity: Not on file   Transportation Needs: Not on file   Physical Activity: Not on file   Stress: Not on file   Social Connections: Not on file   Intimate Partner Violence: Not on file   Depression: Not on file   Housing Stability: Not on file   Interpersonal Safety: Patient Unable To Answer  (09/17/2022)    Interpersonal Safety Domain Source: IP Abuse Screening     Physical abuse: Unable to assess     Verbal abuse: Unable to assess     Emotional abuse: Not on file     Financial abuse: Not on file     Sexual abuse: Not on file   Utilities: Not on file        Medications were reconciled to the best of my ability given all available resources at the time of admission. Route is PO if not otherwise noted.     Family and social history were personally reviewed, all pertinent and relevant details are outlined as above.    Objective:   BP (!) 152/83   Pulse (!) 103   Temp 97.4 F (36.3 C) (Oral)   Resp 17   Ht 1.524 m (5')   Wt 62.6 kg (138 lb)   SpO2 98%   BMI 26.95 kg/m         PHYSICAL EXAM:   General: Alert x oriented x 3, awake, no acute distress,   HEENT: PEERL, EOMI, moist mucus membranes  Neck: Supple, no JVD, no meningeal signs  Chest: Clear to auscultation bilaterally   CVS: RRR, S1 S2 heard, no murmurs/rubs/gallops  Abd: Soft, non-tender, non-distended, +bowel sounds   Ext: No clubbing, no cyanosis, no edema  Neuro/Psych: Pleasant mood and affect, CN 2-12 grossly intact, sensory grossly within normal limit, Strength 5/5 in all extremities  Cap refill: Brisk, less than 3 seconds  Pulses: 2+, symmetric in all extremities  Skin: Warm, dry, without rashes or lesions    Data Review:   I have independently reviewed and interpreted patient's lab and all other diagnostic data    Abnormal Labs Reviewed   CBC WITH AUTO DIFFERENTIAL - Abnormal; Notable for the following components:       Result Value    Hemoglobin 11.0 (*)     Hematocrit 34.7 (*)     MCH 25.8 (*)     RDW 16.4 (*)     Platelets 416 (*)     All other components within normal limits   COMPREHENSIVE METABOLIC PANEL - Abnormal; Notable for the following components:    Sodium 135 (*)     Glucose 136 (*)     BUN 41 (*)     Creatinine 1.46 (*)     BUN/Creatinine Ratio 28 (*)     Est, Glom Filt Rate 34 (*)     Globulin 4.1 (*)      Albumin/Globulin Ratio 0.9 (*)     All other components within normal limits   MAGNESIUM - Abnormal; Notable for the following components:    Magnesium 2.6 (*)     All other components within normal limits   TROPONIN - Abnormal; Notable for the following components:    Troponin, High Sensitivity 58 (*)     All other components within normal limits   BRAIN NATRIURETIC PEPTIDE - Abnormal; Notable for the following components:    NT Pro-BNP 4,123 (*)     All other components within normal limits   ARTERIAL BLOOD GAS, POC - Abnormal; Notable for the following components:    POC pCO2 33.0 (*)     POC PO2 235 (*)     POC HCO3 19.6 (*)     POC O2 SAT 99.8 (*)     All other components within normal limits       Results       Procedure Component Value Units Date/Time    Blood Culture 1 [[id number]]     Order Status: Sent Specimen: Blood     Blood Culture 2 [[id number]]     Order Status: Sent Specimen: Blood     COVID-19 & Influenza Combo [[id number]]     Order Status: Sent Specimen: Nasopharyngeal Swab              IMAGING:   XR CHEST PORTABLE   Final Result   Congestive failure with interstitial pulmonary edema.           ECG/ECHO:    Encounter Date: 09/17/22   EKG 12 Lead   Result Value    Ventricular Rate 109    Atrial Rate 109    P-R Interval 176    QRS Duration 128    Q-T Interval 374    QTc Calculation (Bazett) 503    P Axis 84    R Axis 18    T Axis 145    Diagnosis      Sinus tachycardia with occasional premature ventricular complexes  Left bundle branch block  Abnormal ECG  No previous ECGs available       No results found for this or any previous visit.  Notes reviewed from all clinical/nonclinical/nursing services involved in patient's clinical care. Care coordination discussions were held with appropriate clinical/nonclinical/ nursing providers based on care coordination needs.     Assessment:   Given the patient's current clinical presentation, there is a high level of concern for decompensation if  discharged from the emergency department. Complex decision making was performed, which includes reviewing the patient's available past medical records, laboratory results, and imaging studies.    Active Problems:    * No active hospital problems. *  Resolved Problems:    * No resolved hospital problems. *      Plan:     #Acute Hypoxic respiratory failure  #Acute on chronic heart failure  #pAF with RVR  #HTN  -a-fib with HR at 101  -Status post IV Lasix 40 mg, continue for 2 more doses  -On BiPAP satting well, wean as tolerated  - Strict I&O, and daily weight  -continue metoprolol, and ASA  -check TSH, Mg 2.6  -chads vasc >2, will need r/b/a discussion on anticoagulation  -echo  -consult cardiology    #mild normocytic anemia  -Hgb 11, no baseline for comparison  -anemia w/u including stool occult    #elevated creatinine, acute vs. Stage IIIb CKD  -creatinine 1.46  -avoid renal toxins, and renally dose medications  -monitor    #Dyslipidemia  -continue statin per hospital formulary    #past PE  -not on anticoagulation    #GERD   -continue home PPI    DIET: Diet NPO   ISOLATION PRECAUTIONS: No active isolations  CODE STATUS: full code    DVT PROPHYLAXIS: Lovenox  ANTICIPATED DISCHARGE: 2-3 days  ANTICIPATED DISPOSITION: Home    CRITICAL CARE WAS PERFORMED FOR THIS ENCOUNTER: 30 to 74 minutes      Signed By: Fabian November, MD     Sep 17, 2022         Please note that this dictation may have been completed with Dragon, the computer voice recognition software.  Quite often unanticipated grammatical, syntax, homophones, and other interpretive errors are inadvertently transcribed by the computer software.  Please disregard these errors.  Please excuse any errors that have escaped final proofreading.

## 2022-09-17 NOTE — ACP (Advance Care Planning) (Signed)
Advance Care Planning     Advance Care Planning (ACP) Physician/NP/PA Conversation    Date of Conversation: 09/17/2022  Conducted with: Patient with Decision Making Capacity  Other persons present: Son in Leisure centre manager Decision Maker: No healthcare decision makers have been documented.  Click here to complete Clinical research associate of the Environmental health practitioner Relationship (ie "Primary")   Patient wants all her three children to make decisions for her     Care Preferences:    Hospitalization:  "If your health worsens and it becomes clear that your chance of recovery is unlikely, what would be your preference regarding hospitalization?"  The patient would prefer hospitalization.    Ventilation:  "If you were unable to breath on your own and your chance of recovery was unlikely, what would be your preference about the use of a ventilator (breathing machine) if it was available to you?"  The patient would NOT desire the use of a ventilator.    Resuscitation:  "In the event your heart stopped as a result of an underlying serious health condition, would you want attempts made to restart your heart, or would you prefer a natural death?"  No, do NOT attempt to resuscitate.    resuscitation preferences    Conversation Outcomes / Follow-Up Plan:  ACP complete - no further action today  Reviewed DNR/DNI and patient elects DNR order    Length of Voluntary ACP Conversation in minutes:  20 minutes    Mary Derby, MD

## 2022-09-18 ENCOUNTER — Inpatient Hospital Stay: Payer: MEDICARE | Primary: Internal Medicine

## 2022-09-18 LAB — BASIC METABOLIC PANEL W/ REFLEX TO MG FOR LOW K
Anion Gap: 7 mmol/L (ref 5–15)
BUN/Creatinine Ratio: 31 — ABNORMAL HIGH (ref 12–20)
BUN: 44 MG/DL — ABNORMAL HIGH (ref 6–20)
CO2: 25 mmol/L (ref 21–32)
Calcium: 9.4 MG/DL (ref 8.5–10.1)
Chloride: 102 mmol/L (ref 97–108)
Creatinine: 1.43 MG/DL — ABNORMAL HIGH (ref 0.55–1.02)
Est, Glom Filt Rate: 34 mL/min/{1.73_m2} — ABNORMAL LOW (ref >60)
Glucose: 102 mg/dL — ABNORMAL HIGH (ref 65–100)
Potassium: 4.9 mmol/L (ref 3.5–5.1)
Sodium: 134 mmol/L — ABNORMAL LOW (ref 136–145)

## 2022-09-18 LAB — CBC WITH AUTO DIFFERENTIAL
Basophils %: 0 % (ref 0–1)
Basophils Absolute: 0 10*3/uL (ref 0.0–0.1)
Eosinophils %: 0 % (ref 0–7)
Eosinophils Absolute: 0 10*3/uL (ref 0.0–0.4)
Hematocrit: 36.6 % (ref 35.0–47.0)
Hemoglobin: 11.7 g/dL (ref 11.5–16.0)
Immature Granulocytes %: 0 % (ref 0.0–0.5)
Immature Granulocytes Absolute: 0 10*3/uL (ref 0.00–0.04)
Lymphocytes %: 17 % (ref 12–49)
Lymphocytes Absolute: 1.8 10*3/uL (ref 0.8–3.5)
MCH: 25.7 PG — ABNORMAL LOW (ref 26.0–34.0)
MCHC: 32 g/dL (ref 30.0–36.5)
MCV: 80.3 FL (ref 80.0–99.0)
MPV: 9.6 FL (ref 8.9–12.9)
Monocytes %: 12 % (ref 5–13)
Monocytes Absolute: 1.2 10*3/uL — ABNORMAL HIGH (ref 0.0–1.0)
Neutrophils %: 71 % (ref 32–75)
Neutrophils Absolute: 7.4 10*3/uL (ref 1.8–8.0)
Nucleated RBCs: 0 PER 100 WBC
Platelets: 398 10*3/uL (ref 150–400)
RBC: 4.56 M/uL (ref 3.80–5.20)
RDW: 16.6 % — ABNORMAL HIGH (ref 11.5–14.5)
WBC: 10.6 10*3/uL (ref 3.6–11.0)
nRBC: 0 10*3/uL (ref 0.00–0.01)

## 2022-09-18 MED FILL — FUROSEMIDE 10 MG/ML IJ SOLN: 10 MG/ML | INTRAMUSCULAR | Qty: 4

## 2022-09-18 MED FILL — METOPROLOL SUCCINATE ER 25 MG PO TB24: 25 MG | ORAL | Qty: 1

## 2022-09-18 MED FILL — ASPIRIN LOW DOSE 81 MG PO TBEC: 81 MG | ORAL | Qty: 1

## 2022-09-18 MED FILL — LOVENOX 30 MG/0.3ML IJ SOSY: 30 MG/0.3ML | INTRAMUSCULAR | Qty: 0.3

## 2022-09-18 MED FILL — PANTOPRAZOLE SODIUM 40 MG IV SOLR: 40 MG | INTRAVENOUS | Qty: 40

## 2022-09-18 NOTE — Progress Notes (Signed)
Cardiology Progress Note  09/18/2022     Admit Date: 09/17/2022  Admit Diagnosis: Acute pulmonary edema (HCC) [J81.0]  Paroxysmal atrial fibrillation (HCC) [I48.0]  Acute respiratory failure with hypoxia (HCC) [J96.01]  Atrial fibrillation, unspecified type (HCC) [I48.91]  Acute congestive heart failure, unspecified heart failure type (HCC) [I50.9]  Acute hypoxic respiratory failure (HCC) [J96.01]  CC: none currently    Assessment:   Principal Problem:    Acute hypoxic respiratory failure (HCC)  Resolved Problems:    * No resolved hospital problems. *    Plan:       Acute respiratory failure  Improving     Acute diastolic CHF  Cont IV lasix  GDMT limited by BP, renal function as of yet     Paroxysmal afib: currently sinus with PACs  Not on anticoagulation due to fall risk  Continue PO metoprolol    Subjective:      Mary Woodward has had improvement      Objective:    Physical Exam:  Overall VSSAF;  BP 128/74   Pulse (!) 114   Temp 97.1 F (36.2 C) (Temporal)   Resp 19   Ht 1.524 m (5')   Wt 58.7 kg (129 lb 4.8 oz)   SpO2 100%   BMI 25.25 kg/m   Temp (24hrs), Avg:97.8 F (36.6 C), Min:97.1 F (36.2 C), Max:98.3 F (36.8 C)    Patient Vitals for the past 8 hrs:   Pulse   09/18/22 1027 (!) 114   09/18/22 1017 99   09/18/22 1012 95   09/18/22 1009 80   09/18/22 1006 90   09/18/22 0600 75   09/18/22 0404 72   09/18/22 0401 77    Patient Vitals for the past 8 hrs:   Resp   09/18/22 0600 19    Patient Vitals for the past 8 hrs:   BP   09/18/22 1027 128/74   09/18/22 1017 127/74   09/18/22 1012 128/69   09/18/22 1009 122/71   09/18/22 0953 132/76   09/18/22 0600 121/74      05/16 1901 - 05/18 0700  In: 120 [P.O.:120]  Out: -       General Appearance: Well developed, well nourished, no acute distress.   Ears/Nose/Mouth/Throat:   Normal MM; anicteric.     JVP: WNL   Resp:   Lungs clear to auscultation bilaterally.  Nl resp effort.   Cardiovascular:  RRR, S1, S2 normal, no new murmur. No gallop or rub.   Abdomen:    Soft, non-tender, bowel sounds are present.   Extremities: No edema bilaterally.    Skin:  Neuro: Warm and dry.  A/O x3, grossly nonfocal                         Data Review:     Telemetry independently reviewed :   NSR       ECG independently reviewed: NSR  Labs:   Recent Results (from the past 24 hour(s))   Basic Metabolic Panel w/ Reflex to MG    Collection Time: 09/18/22  5:40 AM   Result Value Ref Range    Sodium 134 (L) 136 - 145 mmol/L    Potassium 4.9 3.5 - 5.1 mmol/L    Chloride 102 97 - 108 mmol/L    CO2 25 21 - 32 mmol/L    Anion Gap 7 5 - 15 mmol/L    Glucose 102 (H) 65 - 100 mg/dL  BUN 44 (H) 6 - 20 MG/DL    Creatinine 4.54 (H) 0.55 - 1.02 MG/DL    BUN/Creatinine Ratio 31 (H) 12 - 20      Est, Glom Filt Rate 34 (L) >60 ml/min/1.31m2    Calcium 9.4 8.5 - 10.1 MG/DL   CBC with Auto Differential    Collection Time: 09/18/22  5:40 AM   Result Value Ref Range    WBC 10.6 3.6 - 11.0 K/uL    RBC 4.56 3.80 - 5.20 M/uL    Hemoglobin 11.7 11.5 - 16.0 g/dL    Hematocrit 09.8 11.9 - 47.0 %    MCV 80.3 80.0 - 99.0 FL    MCH 25.7 (L) 26.0 - 34.0 PG    MCHC 32.0 30.0 - 36.5 g/dL    RDW 14.7 (H) 82.9 - 14.5 %    Platelets 398 150 - 400 K/uL    MPV 9.6 8.9 - 12.9 FL    Nucleated RBCs 0.0 0 PER 100 WBC    nRBC 0.00 0.00 - 0.01 K/uL    Neutrophils % 71 32 - 75 %    Lymphocytes % 17 12 - 49 %    Monocytes % 12 5 - 13 %    Eosinophils % 0 0 - 7 %    Basophils % 0 0 - 1 %    Immature Granulocytes % 0 0.0 - 0.5 %    Neutrophils Absolute 7.4 1.8 - 8.0 K/UL    Lymphocytes Absolute 1.8 0.8 - 3.5 K/UL    Monocytes Absolute 1.2 (H) 0.0 - 1.0 K/UL    Eosinophils Absolute 0.0 0.0 - 0.4 K/UL    Basophils Absolute 0.0 0.0 - 0.1 K/UL    Immature Granulocytes Absolute 0.0 0.00 - 0.04 K/UL    Differential Type AUTOMATED        Current medications reviewed       Tomasita Morrow, MD

## 2022-09-18 NOTE — Progress Notes (Signed)
[facility]                                                                                          Hospitalist Progress Note  Ronnie Derby, MD  Office Phone: [phone]        Date of Service:  09/18/2022  NAME:  Mary Woodward  DOB:  12-24-1927  MRN:  [id number]       Admission Summary:   Mary Woodward is a 89 y.o. female with a pmhx atrial fibrillation, dyslipidemia, hypertension, past PE, osteoarthritis, and osteoporosis who presents from the [facility] nursing home with shortness of breath, and chest pain, and is being admitted for acute hypoxic respiratory failure secondary to acute heart failure, and A-fib with RVR.     She woke up at her facility short of breath, and was found to have SpO2 80% when EMS arrived.  They gave her Decadron, DuoNeb, and placed on CPAP, then transferred to [facility] ED for further evaluation.     In the ED, she was tachycardic to 116, with SpO2 93% on CPAP.  She was placed on BiPAP with FiO2 45.  Labs showed normocytic anemia with hemoglobin 11, BUN 41, creatinine 1.46, troponin 58, and proBNP 4123.  pO2 on ABG was 235 on BiPAP.  Chest x-ray shows congestive heart failure with interstitial pulmonary edema.     In the ED, she received IV Lasix 40 mg.       Interval history / Subjective:   Patient is seen and examined at bedside this AM. She feels better, sob improving. No other concerns  Spoke with her son and daughter in law at bedside - overall improving, Echo pending, need to wean off oxygen, possible dc on Monday   Discussed with nursing        Assessment & Plan:      #Acute Hypoxic respiratory failure - improving   #Acute Diastolic heart failure   - CXR: Congestive failure with interstitial pulmonary edema.   - Flu and covid- negative.   - off BiPAP   - Strict I&O, and daily weight  - appreciate cardiology input   - echo pending   - c/w IV lasix   - saturating well on 4l NC, will try to wean off     #pAF with RVR  -continue  metoprolol for rate control   -chads vasc >2  - not on anticoagulation due to falls      #mild normocytic anemia  -Hgb 11, no baseline for comparison  -anemia w/u including stool occult     # Elevated creatinine, acute vs. CKD  -creatinine 1.46  -avoid renal toxins, and renally dose medications  -monitor renal function      #Dyslipidemia  -continue statin per hospital formulary     # Hx  PE  -not on anticoagulation     #GERD   -continue PPI     Code status: DNR  Prophylaxis: lovenox   Care Plan discussed with: pt/ family, RN  Anticipated Disposition: TBD  Inpatient  Cardiac monitoring: Telemetry  Central Line:  Social Determinants of Health     Tobacco Use: Not on file   Alcohol Use: Not At Risk (09/17/2022)    AUDIT-C     Frequency of Alcohol Consumption: Never     Average Number of Drinks: Patient does not drink     Frequency of Binge Drinking: Never   Financial Resource Strain: Not on file   Food Insecurity: Not on file   Transportation Needs: Not on file   Physical Activity: Not on file   Stress: Not on file   Social Connections: Not on file   Intimate Partner Violence: Not on file   Depression: Not on file   Housing Stability: Not on file   Interpersonal Safety: Not At Risk (09/17/2022)    Interpersonal Safety Domain Source: IP Abuse Screening     Physical abuse: Denies     Verbal abuse: Denies     Emotional abuse: Denies     Financial abuse: Denies     Sexual abuse: Denies   Utilities: Not on file       Review of Systems:   A comprehensive review of systems was negative.       Vital Signs:    Last 24hrs VS reviewed since prior progress note. Most recent are:  Vitals:    09/18/22 1204   BP:    Pulse: 93   Resp:    Temp:    SpO2:          Intake/Output Summary (Last 24 hours) at 09/18/2022 1221  Last data filed at 09/18/2022 1012  Gross per 24 hour   Intake 520 ml   Output 100 ml   Net 420 ml        Physical Examination:     I had a face to face encounter with this patient and independently examined them  on 09/18/2022 as outlined below:          General : alert x 3, awake, no acute distress, elderly   HEENT: PEERL, EOMI, moist mucus membrane  Neck: supple, no JVD, no meningeal signs  Chest: Clear to auscultation bilaterally   CVS: S1 S2 heard, Capillary refill less than 2 seconds  Abd: soft/ non tender, non distended, BS physiological,   Ext: no clubbing, no cyanosis, no edema, brisk 2+ DP pulses  Neuro/Psych: pleasant mood and affect, CN 2-12 grossly intact, sensory grossly within normal limit, Strength 5/5 in all extremities  Skin: warm     Data Review:    I personally reviewed  Image      I have personally and independently reviewed all pertinent labs, diagnostic studies, imaging, and have provided independent interpretation of the same.     Labs:     Recent Labs     09/17/22  0437 09/18/22  0540   WBC 8.6 10.6   HGB 11.0* 11.7   HCT 34.7* 36.6   PLT 416* 398       Recent Labs     09/17/22  0437 09/18/22  0540   NA 135* 134*   K 4.2 4.9   CL 104 102   CO2 23 25   BUN 41* 44*   MG 2.6*  --        Recent Labs     09/17/22  0437   ALT 16   GLOB 4.1*       No results for input(s): "INR", "APTT" in the last 72 hours.    Invalid input(s): "PTP"   Recent Labs  09/17/22  0437   TIBC 447        No results found for: "RBCF"   No results for input(s): "PH", "PCO2", "PO2" in the last 72 hours.  No results for input(s): "CPK" in the last 72 hours.    Invalid input(s): "CPKMB", "CKNDX", "TROIQ"  No results found for: "CHOL", "CHLST", "CHOLV", "HDL", "HDLC", "LDL"  No results found for: "GLUCPOC"  @LABUA @    Notes reviewed from all clinical/nonclinical/nursing services involved in patient's clinical care. Care coordination discussions were held with appropriate clinical/nonclinical/ nursing providers based on care coordination needs.         Patients current active Medications were reviewed, considered, added and adjusted based on the clinical condition today.      Home Medications were reconciled to the best of my ability  given all available resources at the time of admission. Route is PO if not otherwise noted.      Admission Status:[id number]:::1}      Medications Reviewed:     Current Facility-Administered Medications   Medication Dose Route Frequency    sodium chloride flush 0.9 % injection 5-40 mL  5-40 mL IntraVENous 2 times per day    sodium chloride flush 0.9 % injection 5-40 mL  5-40 mL IntraVENous PRN    0.9 % sodium chloride infusion   IntraVENous PRN    ondansetron (ZOFRAN-ODT) disintegrating tablet 4 mg  4 mg Oral Q8H PRN    Or    ondansetron (ZOFRAN) injection 4 mg  4 mg IntraVENous Q6H PRN    polyethylene glycol (GLYCOLAX) packet 17 g  17 g Oral Daily PRN    acetaminophen (TYLENOL) tablet 650 mg  650 mg Oral Q6H PRN    Or    acetaminophen (TYLENOL) suppository 650 mg  650 mg Rectal Q6H PRN    metoprolol succinate (TOPROL XL) extended release tablet 25 mg  25 mg Oral QPM    pantoprazole (PROTONIX) 40 mg in sodium chloride (PF) 0.9 % 10 mL injection  40 mg IntraVENous Daily    aspirin EC tablet 81 mg  81 mg Oral Daily    furosemide (LASIX) injection 40 mg  40 mg IntraVENous BID    enoxaparin Sodium (LOVENOX) injection 30 mg  30 mg SubCUTAneous Daily     ______________________________________________________________________  EXPECTED LENGTH OF STAY: 3  ACTUAL LENGTH OF STAY:          1                 Ronnie Derby, MD

## 2022-09-18 NOTE — Progress Notes (Signed)
PHYSICAL THERAPY EVALUATION/DISCHARGE    Patient: Mary Woodward (89 y.o. female)  Date: 09/18/2022  Primary Diagnosis: Acute pulmonary edema (HCC) [J81.0]  Paroxysmal atrial fibrillation (HCC) [I48.0]  Acute respiratory failure with hypoxia (HCC) [J96.01]  Atrial fibrillation, unspecified type (HCC) [I48.91]  Acute congestive heart failure, unspecified heart failure type (HCC) [I50.9]  Acute hypoxic respiratory failure (HCC) [J96.01]       Precautions: Fall Risk                      ASSESSMENT AND RECOMMENDATIONS:  Based on the objective data below, the patient demonstrates near-baseline level of functional independence and WFL activity tolerance with stable vital signs in setting of hospital admission for acute hypoxic respiratory failure and acute on chronic CHF with hx pAF with RVR. Patient received semi-fowlers in bed on 3 LPM O2 via NC, oriented x 4, and agreeable to PT. Overall patient mobilized at CGA-SBA level for all bed mobility, sit <> stand transfers, and for ambulation of functional distances into hallway using RW for support. Noted downward gaze and intermittently accelerated gait speed with mild anterior lean onto RW - VCs for safety including forward gaze and decreasing weight through UEs on RW provided, patient demonstrated excellent understanding. Supplemental O2 weaned off to RA and SpO2 remained >98% throughout activity with no DOE reported. All other VSS throughout activity.     At this time, patient appears close to functional PLOF, will sign off. Recommend OOB to chair and ambulation daily in hallway with RW and RN supervision while admitted, and continued PT at ILF at discharge.     Functional Outcome Measure:  The patient scored 22/24 on the AMPAC outcome measure with Cutoff score ?171,2,3 had higher odds of discharging home with home health or need of SNF/IPR.     09/18/22 1009 09/18/22 1012 09/18/22 1017 09/18/22 1027   Vitals    Pulse 80 95 99 (!) 114   BP 122/71 128/69 127/74 128/74    MAP (Calculated) 88 89 92 92   Patient Position Semi fowlers Sitting (EOB) Standing Sitting;Up in chair (post-gait)   SpO2 100 % 100 % 99 % 100 %   O2 Flow Rate (L/min) 3 L/min 3 L/min 1 L/min --   O2 Device Nasal cannula Nasal cannula Nasal cannula None (Room air)      Further skilled acute physical therapy is not indicated at this time.       PLAN :  Recommendation for discharge: (in order for the patient to meet his/her long term goals): Therapy 2x a week in the home, however, may require supervision, cognitive and/or physical assistance due to the following concerns listed below:    Other factors to consider for discharge: lives alone    IF patient discharges home will need the following DME: patient owns DME required for discharge       SUBJECTIVE:   Patient stated "I like to play bridge."    OBJECTIVE DATA SUMMARY:   No past medical history on file.No past surgical history on file.    Home Situation and Prior Level of Function: mod I with rollator   Social/Functional History  Lives With: Alone  Type of Home: Apartment (ILF at the [facility])  Home Layout: One level  Home Access: Level entry, Occupational psychologist Shower/Tub: Event organiser: Grab bars in shower, Paediatric nurse, Holiday representative Accessibility: Accessible  Home Equipment: Occupational hygienist, Medical laboratory scientific officer (rollator for community ambulation)  Has the patient had  two or more falls in the past year or any fall with injury in the past year?: Yes (2)  ADL Assistance: Independent (meals from dining hall)  Ambulation Assistance: Independent  Transfer Assistance: Independent  Active Driver: No  Patient's Driver Info: daughter  Mode of Transportation: Set designer  Occupation: Retired  Critical Behavior:  Orientation  Overall Orientation Status: Within Normal Limits  Orientation Level: Oriented X4  Cognition  Overall Cognitive Status: WNL    Hearing:   Hearing  Hearing: Exceptions to [facility]  Hearing Exceptions: Bilateral hearing aid    Vision/Perceptual:           Vision  Vision: Within Functional Limits       Strength:    Strength: Within functional limits    Tone & Sensation:   Tone: Normal  Sensation: Intact    Coordination:  Coordination: Within functional limits    Range Of Motion:  AROM: Generally decreased, functional       Functional Mobility:  Bed Mobility:     Bed Mobility Training  Bed Mobility Training: Yes  Rolling: Stand-by assistance  Supine to Sit: Stand-by assistance  Scooting: Stand-by assistance  Transfers:     Art therapist: Yes  Sit to Stand: Stand-by assistance  Stand to Sit: Stand-by assistance  Bed to Chair: Stand-by assistance;Contact-guard assistance  Toilet Transfer: Stand-by assistance  Balance:               Balance  Sitting: Intact  Standing: Impaired  Standing - Static: Good;Constant support  Standing - Dynamic: Good;Constant support  Ambulation/Gait Training:                       Gait  Gait Training: Yes  Overall Level of Assistance: Stand-by assistance;Contact-guard assistance  Distance (ft): 120 Feet  Assistive Device: Gait belt;Walker, rolling  Interventions: Safety awareness training;Verbal cues  Base of Support: Narrowed  Speed/Cadence: Accelerated  Step Length: Right shortened;Left shortened  Gait Abnormalities: Decreased step clearance (slight anterior lean onto RW causing mild accelerated speed - corrected with VCs)  [employer] AM-PAC      Basic Mobility Inpatient Short Form (6-Clicks) Version 2    How much help is needed turning from your back to your side while in a flat bed without using bedrails?: None  How much help is needed moving from lying on your back to sitting on the side of a flat bed without using bedrails?: None  How much help is needed  moving to and from a bed to a chair?: None  How much help is needed standing up from a chair using your arms?: None  How much help is needed walking in hospital room?: A Little  How much help is needed climbing 3-5 steps with a railing?: A Little    AM-PAC Inpatient Mobility Raw Score : 22  AM-PAC Inpatient T-Scale Score : 53.28     Cutoff score ?171,2,3 had higher odds of discharging home with home health or need of SNF/IPR.    1. Emelia Loron, Janeece Riggers, Vinoth Fransico Meadow, Lupe Carney Passek, Thornton Dales. Cassandria Anger.  Validity of the AM-PAC "6-Clicks" Inpatient Daily Activity and Basic Mobility Short Forms. Physical Therapy Mar 2014, 94 (3) 379-391; DOI: 10.2522/ptj.20130199  2. Venetia Night. Association of AM-PAC "6-Clicks" Basic Mobility and Daily Activity Scores With Discharge Destination. Phys Ther. 2021 Apr 4;101(4):pzab043. doi: 10.1093/ptj/pzab043. PMID: 91478295.  3. Herbold J, Rajaraman D, Lubertha Basque, Agayby K, Mantee S. Activity Measure for Post-Acute Care "6-Clicks" Basic Mobility Scores Predict Discharge Destination After Acute Care Hospitalization in Select Patient Groups: A Retrospective, Observational Study. Arch Rehabil Res Clin Transl. 2022 Jul 16;4(3):100204. doi: 10.1016/j.arrct.6213.086578. PMID: 46962952; PMCID: WUX3244010.  4. Josefina Do, Coster W, Ni P. AM-PAC Short Forms Manual 4.0. Revised 06/2018.                                                                                                                                                                                                                               Pain Rating:  0/10   Pain Intervention(s):   pain is at a level acceptable to the patient    Activity Tolerance:   Good and SpO2 stable on room air    After treatment:   Patient left in no apparent distress sitting up in chair, Call bell within reach, Bed/ chair alarm activated, and Caregiver / family  present      COMMUNICATION/EDUCATION:   The patient's plan  of care was discussed with: occupational therapist and registered nurse    Patient Education  Education Given To: Patient;Family  Education Provided: Role of Therapy;Plan of Care;Transfer Training;Energy Conservation;Equipment;Fall Prevention Strategies  Education Provided Comments: safe management of RW  Education Method: Verbal;Demonstration  Barriers to Learning: None  Education Outcome: Verbalized understanding;Continued education needed    Thank you for this referral.  Georgiann Mccoy, PT, DPT  Minutes: 30      Physical Therapy Evaluation Charge Determination   History Examination Presentation Decision-Making   MEDIUM  Complexity : 1-2 comorbidities / personal factors will impact the outcome/ POC  LOW Complexity : 1-2 Standardized tests and measures addressing body structure, function, activity limitation and / or participation in recreation  LOW Complexity : Stable, uncomplicated  AM-PAC  LOW      Based on the above components, the patient evaluation is determined to be of the following complexity level: Low

## 2022-09-18 NOTE — Plan of Care (Signed)
Problem: Discharge Planning  Goal: Discharge to home or other facility with appropriate resources  Outcome: Progressing     Problem: Pain  Goal: Verbalizes/displays adequate comfort level or baseline comfort level  Outcome: Progressing     Problem: Safety - Adult  Goal: Free from fall injury  Outcome: Progressing     Problem: Skin/Tissue Integrity  Goal: Absence of new skin breakdown  Description: 1.  Monitor for areas of redness and/or skin breakdown  2.  Assess vascular access sites hourly  3.  Every 4-6 hours minimum:  Change oxygen saturation probe site  4.  Every 4-6 hours:  If on nasal continuous positive airway pressure, respiratory therapy assess nares and determine need for appliance change or resting period.  Outcome: Progressing

## 2022-09-18 NOTE — Progress Notes (Signed)
OCCUPATIONAL THERAPY EVALUATION/DISCHARGE  Patient: Mary Woodward (89 y.o. female)  Date: 09/18/2022  Primary Diagnosis: Acute pulmonary edema (HCC) [J81.0]  Paroxysmal atrial fibrillation (HCC) [I48.0]  Acute respiratory failure with hypoxia (HCC) [J96.01]  Atrial fibrillation, unspecified type (HCC) [I48.91]  Acute congestive heart failure, unspecified heart failure type (HCC) [I50.9]  Acute hypoxic respiratory failure (HCC) [J96.01]         Precautions: Fall Risk                  ASSESSMENT :  Based on the objective data below, the patient presents near her functional baseline s/p admission for AHRF, acute on chronic CHF and Afib with RVR. She resides in ILF at [facility], utilizes a rollator for functional mobility, Mod I for ADLs and some IADLs, ambulates to/from a dining hall for meals, attends PT 2-3x/week. She now presents near her baseline, received on 3L NC however able to wean to RA with SpO2 98-100% with all activity. She is largely SBA for bathroom mobility, toileting, and lower body dressing with good safety awareness. No further acute OT needs, safe to d/c back to ILF with continued PT services when medically cleared.      Functional Outcome Measure:  The patient scored 21/24 on the AM-PAC outcome measure which is indicative of lower odds of rehab needs upon d/c.      Further skilled acute occupational therapy is not indicated at this time.     PLAN :  Recommend with staff: Recommend with nursing, ADLs with supervision/setup, OOB to chair 3x/day via rolling walker and toileting via functional mobility to and from bathroom. Thank you for completing as able in order to maintain patient strength, endurance and independence.     Recommendation for discharge: (in order for the patient to meet his/her long term goals): No skilled occupational therapy    Other factors to consider for discharge: no additional factors    IF patient discharges home will need the following DME: none     SUBJECTIVE:   Patient  stated, "I can get down there! I have to be able to so I can put on my shoes!" stated with assessment of reach to distal BLEs    OBJECTIVE DATA SUMMARY:   No past medical history on file.No past surgical history on file.    Prior Level of Function/Environment/Context:  , ADL Assistance: Independent (meals from dining hall),  ,  ,  ,  ,  ,  , Ambulation Assistance: Independent, Transfer Assistance: Independent, Active Driver: No     Expanded or extensive additional review of patient history:   Social/Functional History  Lives With: Alone  Type of Home: Apartment (ILF at the [facility])  Home Layout: One level  Home Access: Level entry, Occupational psychologist Shower/Tub: Event organiser: Grab bars in shower, Paediatric nurse, Holiday representative Accessibility: Accessible  Home Equipment: Occupational hygienist, Medical laboratory scientific officer (rollator for community ambulation)  Has the patient had two or more falls in the past year or any fall with injury in the past year?: Yes (2)  ADL Assistance: Independent (meals from dining hall)  Ambulation Assistance: Independent  Transfer Assistance: Independent  Active Driver: No  Patient's Driver Info: daughter  Mode of Transportation: Set designer  Occupation: Retired    Higher education careers adviser Dominance: right     EXAMINATION OF PERFORMANCE DEFICITS:    Cognitive/Behavioral Status:    Orientation  Overall Orientation Status: Within Normal Limits  Orientation Level: Oriented X4  Cognition  Overall Cognitive Status:  WNL    Skin: Appears intact    Edema: None    Hearing:   Hearing  Hearing: Exceptions to [facility]  Hearing Exceptions: Hard of hearing/hearing concerns, Bilateral hearing aid    Vision/Perceptual:          Vision  Vision: Impaired  Vision Exceptions: Wears glasses for reading (reports reading glasses are no longer effective d/t worsening vision with aging)       Range of Motion:   AROM: Generally decreased, functional         Strength:  Strength: Generally decreased, functional      Coordination:  Coordination: Generally  decreased, functional     Coordination: Within functional limits      Tone & Sensation:   Tone: Normal  Sensation: Intact      Functional Mobility and Transfers for ADLs:  Bed Mobility:     Bed Mobility Training  Bed Mobility Training: Yes  Rolling: Stand-by assistance  Supine to Sit: Stand-by assistance  Scooting: Stand-by assistance    Transfers:     Art therapist: Yes  Sit to Stand: Stand-by assistance  Stand to Sit: Stand-by assistance  Bed to Chair: Stand-by assistance;Contact-guard assistance  Toilet Transfer: Stand-by assistance                                   Balance:      Balance  Sitting: Intact  Standing: Impaired  Standing - Static: Good;Constant support  Standing - Dynamic: Good;Constant support      ADL Assessment:          Feeding: Independent       Grooming: Stand by assistance       UE Bathing: Stand by assistance       Skin Care: N/A    LE Bathing: Stand by assistance       UE Dressing: Stand by assistance       LE Dressing: Stand by assistance  LE Dressing Skilled Clinical Factors: good access to distal BLEs with adjusting socks with SBA for standing balance    Toileting: Stand by assistance  Toileting Skilled Clinical Factors: simulated in bathroom, good safety awareness to ascend/descend onto toilet           Functional Mobility: Stand by assistance        Skin Care: N/A    ADL Intervention and task modifications:        [employer] AM-PACTM "6 Clicks"                                                       Daily Activity Inpatient Short Form  How much help from another person does the patient currently need... Total; A Lot A Little None   1.  Putting on and taking off regular lower body clothing? []   1 []   2 [x]   3 []   4   2.  Bathing (including washing, rinsing, drying)? []   1 []   2 [x]   3 []   4   3.  Toileting, which includes using toilet, bedpan or urinal? []  1 []   2 [x]   3 []   4   4.  Putting on and taking off regular upper body clothing? []   1 []   2 []   3 [x]    4   5.  Taking care of personal grooming such as brushing teeth? []   1 []   2 []   3 [x]   4   6.  Eating meals? []   1 []   2 []   3 [x]   4    2007, Trustees of [address], under license to [employer]. All rights reserved     Score: 21/24     Interpretation of Tool:  Represents clinically-significant functional categories (i.e. Activities of daily living).    Cutoff score 39.4 (19) correlates to a good likelihood of discharging home versus a facility  Diane U. Frederick Peers, Janeece Riggers, Leslie Andrea, Lupe Carney. Passek, Thornton Dales. Cassandria Anger, AM-PAC "6-Clicks" Functional Assessment Scores Predict Acute Care Hospital Discharge Destination, Physical Therapy, Volume 94, Issue 9, 01 January 2013, Pages [id number], [email or web address]    Pain Rating:  None reported/10   Pain Intervention(s):   nursing notified, rest, and repositioning    Activity Tolerance:   Good and SpO2 stable on room air    After treatment:   Patient left in no apparent distress sitting up in chair, Call bell within reach, Bed/ chair alarm activated, and Caregiver / family present    COMMUNICATION/EDUCATION:   The patient's plan of care was discussed with: physical therapist and registered nurse    Patient Education  Education Given To: Patient  Education Provided: Role of Therapy;ADL Adaptive Strategies;Transfer Training;Energy Conservation;IADL Safety;Fall Prevention Strategies  Education Method: Demonstration;Verbal;Teach Back  Barriers to Learning: None  Education Outcome: Verbalized understanding;Demonstrated understanding    Thank you for this referral.  Daine Floras, OTD, OTR/L  Minutes: 29    Occupational Therapy Evaluation Charge Determination   History Examination Decision-Making   LOW Complexity : Brief history review  LOW Complexity: 1-3 Performance deficits relating to physical, cognitive, or psychosocial skills that result in activity limitations and/or participation restrictions LOW Complexity:  No comorbidities that affect functional and  no verbal  or physical assist needed to complete eval tasks   Based on the above components, the patient evaluation is determined to be of the following complexity level: Low

## 2022-09-18 NOTE — Progress Notes (Signed)
1000: Per MD. Madala, no need to repeat the lactic levels.

## 2022-09-19 ENCOUNTER — Inpatient Hospital Stay: Admit: 2022-09-19 | Payer: MEDICARE | Primary: Internal Medicine

## 2022-09-19 LAB — ECHO (TTE) COMPLETE (PRN CONTRAST/BUBBLE/STRAIN/3D)
AV Area by Peak Velocity: 2.4 cm2
AV Area by VTI: 2.3 cm2
AV Mean Gradient: 4 mmHg
AV Mean Velocity: 0.9 m/s
AV Peak Gradient: 10 mmHg
AV Peak Velocity: 1.6 m/s
AV VTI: 25.9 cm
AV Velocity Ratio: 0.69
AVA/BSA Peak Velocity: 1.5 cm2/m2
AVA/BSA VTI: 1.4 cm2/m2
Ao Root Index: 2.14 cm/m2
Aortic Root: 3.4 cm
Ascending Aorta Index: 1.82 cm/m2
Ascending Aorta: 2.9 cm
Body Surface Area: 1.62 m2
E/E' Lateral: 8.14
E/E' Ratio (Averaged): 11.2
E/E' Septal: 14.25
Est. RA Pressure: 3 mmHg
Fractional Shortening 2D: 38 % (ref 28–44)
IVSd: 1.1 cm — AB (ref 0.6–0.9)
LA Diameter: 4 cm
LA Size Index: 2.52 cm/m2
LA Volume A-L A4C: 112 mL — AB (ref 22–52)
LA Volume A-L A4C: 122 mL — AB (ref 22–52)
LA Volume A/L: 119 mL
LA Volume Index A-L A2C: 70 mL/m2 — AB (ref 16–34)
LA Volume Index A-L A4C: 77 mL/m2 — AB (ref 16–34)
LA Volume Index A/L: 75 mL/m2 (ref 16–34)
LA Volume Index MOD A2C: 69 ml/m2 — AB (ref 16–34)
LA Volume Index MOD A4C: 64 ml/m2 — AB (ref 16–34)
LA Volume MOD A2C: 109 mL — AB (ref 22–52)
LA Volume MOD A4C: 102 mL — AB (ref 22–52)
LA/AO Root Ratio: 1.18
LV E' Lateral Velocity: 7 cm/s
LV E' Septal Velocity: 4 cm/s
LV Mass 2D Index: 125.5 g/m2 — AB (ref 43–95)
LV Mass 2D: 199.6 g — AB (ref 67–162)
LV RWT Ratio: 0.51
LVIDd Index: 2.96 cm/m2
LVIDd: 4.7 cm (ref 3.9–5.3)
LVIDs Index: 1.82 cm/m2
LVIDs: 2.9 cm
LVOT Area: 3.5 cm2
LVOT Diameter: 2.1 cm
LVOT Mean Gradient: 2 mmHg
LVOT Peak Gradient: 5 mmHg
LVOT Peak Velocity: 1.1 m/s
LVOT SV: 62.7 ml
LVOT Stroke Volume Index: 39.4 mL/m2
LVOT VTI: 18.1 cm
LVOT:AV VTI Index: 0.7
LVPWd: 1.2 cm — AB (ref 0.6–0.9)
MR Peak Gradient: 34 mmHg
MR Peak Velocity: 2.9 m/s
MV A Velocity: 0.75 m/s
MV Area by PHT: 2.6 cm2
MV Area by VTI: 2.2 cm2
MV E Velocity: 0.57 m/s
MV E Wave Deceleration Time: 286.8 ms
MV E/A: 0.76
MV Max Velocity: 1 m/s
MV Mean Gradient: 1 mmHg
MV Mean Velocity: 0.4 m/s
MV PHT: 83.2 ms
MV Peak Gradient: 4 mmHg
MV VTI: 28.8 cm
MV:LVOT VTI Index: 1.59
PV Max Velocity: 1 m/s
PV Peak Gradient: 4 mmHg
RV Free Wall Peak S': 11 cm/s
RVSP: 30 mmHg
TAPSE: 1.8 cm (ref 1.7–?)
TR Max Velocity: 2.59 m/s
TR Peak Gradient: 27 mmHg

## 2022-09-19 LAB — CBC WITH AUTO DIFFERENTIAL
Basophils %: 1 % (ref 0–1)
Basophils Absolute: 0.1 10*3/uL (ref 0.0–0.1)
Eosinophils %: 0 % (ref 0–7)
Eosinophils Absolute: 0 10*3/uL (ref 0.0–0.4)
Hematocrit: 33.7 % — ABNORMAL LOW (ref 35.0–47.0)
Hemoglobin: 11.4 g/dL — ABNORMAL LOW (ref 11.5–16.0)
Immature Granulocytes %: 0 % (ref 0.0–0.5)
Immature Granulocytes Absolute: 0 10*3/uL (ref 0.00–0.04)
Lymphocytes %: 28 % (ref 12–49)
Lymphocytes Absolute: 1.8 10*3/uL (ref 0.8–3.5)
MCH: 26.1 PG (ref 26.0–34.0)
MCHC: 33.8 g/dL (ref 30.0–36.5)
MCV: 77.3 FL — ABNORMAL LOW (ref 80.0–99.0)
MPV: 10.3 FL (ref 8.9–12.9)
Monocytes %: 13 % (ref 5–13)
Monocytes Absolute: 0.8 10*3/uL (ref 0.0–1.0)
Neutrophils %: 58 % (ref 32–75)
Neutrophils Absolute: 3.6 10*3/uL (ref 1.8–8.0)
Nucleated RBCs: 0 PER 100 WBC
Platelets: 453 10*3/uL — ABNORMAL HIGH (ref 150–400)
RBC: 4.36 M/uL (ref 3.80–5.20)
RDW: 16.2 % — ABNORMAL HIGH (ref 11.5–14.5)
WBC: 6.2 10*3/uL (ref 3.6–11.0)
nRBC: 0 10*3/uL (ref 0.00–0.01)

## 2022-09-19 LAB — BASIC METABOLIC PANEL W/ REFLEX TO MG FOR LOW K
Anion Gap: 6 mmol/L (ref 5–15)
BUN/Creatinine Ratio: 33 — ABNORMAL HIGH (ref 12–20)
BUN: 53 MG/DL — ABNORMAL HIGH (ref 6–20)
CO2: 26 mmol/L (ref 21–32)
Calcium: 8.7 MG/DL (ref 8.5–10.1)
Chloride: 101 mmol/L (ref 97–108)
Creatinine: 1.6 MG/DL — ABNORMAL HIGH (ref 0.55–1.02)
Est, Glom Filt Rate: 30 mL/min/{1.73_m2} — ABNORMAL LOW (ref >60)
Glucose: 96 mg/dL (ref 65–100)
Potassium: 4.2 mmol/L (ref 3.5–5.1)
Sodium: 133 mmol/L — ABNORMAL LOW (ref 136–145)

## 2022-09-19 MED ORDER — PANTOPRAZOLE SODIUM 40 MG PO TBEC
40 | Freq: Every day | ORAL | Status: DC
Start: 2022-09-19 — End: 2022-09-20
  Administered 2022-09-19 – 2022-09-20 (×2): 40 mg via ORAL

## 2022-09-19 MED ORDER — FUROSEMIDE 40 MG PO TABS
40 MG | Freq: Every day | ORAL | Status: DC
Start: 2022-09-19 — End: 2022-09-20
  Administered 2022-09-20: 13:00:00 20 mg via ORAL

## 2022-09-19 MED ORDER — EMPAGLIFLOZIN 10 MG PO TABS
10 | Freq: Every day | ORAL | Status: DC
Start: 2022-09-19 — End: 2022-09-20
  Administered 2022-09-19 – 2022-09-20 (×2): 10 mg via ORAL

## 2022-09-19 MED FILL — ASPIRIN LOW DOSE 81 MG PO TBEC: 81 MG | ORAL | Qty: 1

## 2022-09-19 MED FILL — JARDIANCE 10 MG PO TABS: 10 MG | ORAL | Qty: 1

## 2022-09-19 MED FILL — PANTOPRAZOLE SODIUM 40 MG PO TBEC: 40 MG | ORAL | Qty: 1

## 2022-09-19 MED FILL — METOPROLOL SUCCINATE ER 25 MG PO TB24: 25 MG | ORAL | Qty: 1

## 2022-09-19 MED FILL — LOVENOX 30 MG/0.3ML IJ SOSY: 30 MG/0.3ML | INTRAMUSCULAR | Qty: 0.3

## 2022-09-19 NOTE — Progress Notes (Signed)
Cardiology Progress Note  09/19/2022     Admit Date: 09/17/2022  Admit Diagnosis: Acute pulmonary edema (HCC) [J81.0]  Paroxysmal atrial fibrillation (HCC) [I48.0]  Acute respiratory failure with hypoxia (HCC) [J96.01]  Atrial fibrillation, unspecified type (HCC) [I48.91]  Acute congestive heart failure, unspecified heart failure type (HCC) [I50.9]  Acute hypoxic respiratory failure (HCC) [J96.01]  CC: none currently    Assessment:   Principal Problem:    Acute hypoxic respiratory failure (HCC)  Resolved Problems:    * No resolved hospital problems. *    Plan:       Acute respiratory failure  Improving     Acute diastolic CHF  GDMT limited by BP, renal function as of yet  Held IV lasix given increase in Crr, can likely start PO tomorrow  Added Jardiance     Paroxysmal afib: currently sinus with PACs  Not on anticoagulation due to fall risk  Continue PO metoprolol    Subjective:      Mary Woodward has had improvement      Objective:    Physical Exam:  Overall VSSAF;  BP 115/63   Pulse (!) 112   Temp 98.2 F (36.8 C) (Oral)   Resp 20   Ht 1.524 m (5')   Wt 62.2 kg (137 lb 2 oz)   SpO2 95%   BMI 26.78 kg/m   Temp (24hrs), Avg:98.1 F (36.7 C), Min:97.8 F (36.6 C), Max:98.4 F (36.9 C)    Patient Vitals for the past 8 hrs:   Pulse   09/19/22 1400 (!) 112   09/19/22 1337 84   09/19/22 1336 100   09/19/22 1335 92   09/19/22 1334 90   09/19/22 1333 95   09/19/22 1332 95   09/19/22 1331 90   09/19/22 1325 84   09/19/22 1315 85   09/19/22 1305 85   09/19/22 1255 76   09/19/22 1245 82   09/19/22 1235 95   09/19/22 1215 78   09/19/22 1200 77   09/19/22 1130 71   09/19/22 1000 (!) 101   09/19/22 0730 68   09/19/22 0700 74      Patient Vitals for the past 8 hrs:   Resp   09/19/22 1337 20   09/19/22 1336 22   09/19/22 1335 28   09/19/22 1334 25   09/19/22 1333 22   09/19/22 1332 21   09/19/22 1331 17   09/19/22 1325 23   09/19/22 1315 19   09/19/22 1305 18   09/19/22 1255 27   09/19/22 1245 15   09/19/22 1235 22    09/19/22 1215 20   09/19/22 1130 17   09/19/22 0730 23   09/19/22 0700 18      Patient Vitals for the past 8 hrs:   BP   09/19/22 1420 115/63   09/19/22 1130 115/63   09/19/22 0730 (!) 129/57   09/19/22 0700 (!) 150/66        05/17 1901 - 05/19 0700  In: 1150 [P.O.:1150]  Out: 1400 [Urine:1400]      General Appearance: Well developed, well nourished, no acute distress.   Ears/Nose/Mouth/Throat:   Normal MM; anicteric.     JVP: WNL   Resp:   Lungs clear to auscultation bilaterally.  Nl resp effort.   Cardiovascular:  RRR, S1, S2 normal, no new murmur. No gallop or rub.   Abdomen:   Soft, non-tender, bowel sounds are present.   Extremities: No edema bilaterally.    Skin:  Neuro: Warm and dry.  A/O x3, grossly nonfocal                         Data Review:     Telemetry independently reviewed :   NSR       ECG independently reviewed: NSR  Labs:   Recent Results (from the past 24 hour(s))   Basic Metabolic Panel w/ Reflex to MG    Collection Time: 09/19/22  5:57 AM   Result Value Ref Range    Sodium 133 (L) 136 - 145 mmol/L    Potassium 4.2 3.5 - 5.1 mmol/L    Chloride 101 97 - 108 mmol/L    CO2 26 21 - 32 mmol/L    Anion Gap 6 5 - 15 mmol/L    Glucose 96 65 - 100 mg/dL    BUN 53 (H) 6 - 20 MG/DL    Creatinine 4.09 (H) 0.55 - 1.02 MG/DL    BUN/Creatinine Ratio 33 (H) 12 - 20      Est, Glom Filt Rate 30 (L) >60 ml/min/1.30m2    Calcium 8.7 8.5 - 10.1 MG/DL   CBC with Auto Differential    Collection Time: 09/19/22  5:57 AM   Result Value Ref Range    WBC 6.2 3.6 - 11.0 K/uL    RBC 4.36 3.80 - 5.20 M/uL    Hemoglobin 11.4 (L) 11.5 - 16.0 g/dL    Hematocrit 81.1 (L) 35.0 - 47.0 %    MCV 77.3 (L) 80.0 - 99.0 FL    MCH 26.1 26.0 - 34.0 PG    MCHC 33.8 30.0 - 36.5 g/dL    RDW 91.4 (H) 78.2 - 14.5 %    Platelets 453 (H) 150 - 400 K/uL    MPV 10.3 8.9 - 12.9 FL    Nucleated RBCs 0.0 0 PER 100 WBC    nRBC 0.00 0.00 - 0.01 K/uL    Neutrophils % 58 32 - 75 %    Lymphocytes % 28 12 - 49 %    Monocytes % 13 5 - 13 %    Eosinophils %  0 0 - 7 %    Basophils % 1 0 - 1 %    Immature Granulocytes % 0 0.0 - 0.5 %    Neutrophils Absolute 3.6 1.8 - 8.0 K/UL    Lymphocytes Absolute 1.8 0.8 - 3.5 K/UL    Monocytes Absolute 0.8 0.0 - 1.0 K/UL    Eosinophils Absolute 0.0 0.0 - 0.4 K/UL    Basophils Absolute 0.1 0.0 - 0.1 K/UL    Immature Granulocytes Absolute 0.0 0.00 - 0.04 K/UL    Differential Type AUTOMATED     Echo (TTE) complete (PRN contrast/bubble/strain/3D)    Collection Time: 09/19/22  2:15 PM   Result Value Ref Range    IVSd 1.1 (A) 0.6 - 0.9 cm    LVIDd 4.7 3.9 - 5.3 cm    LVIDs 2.9 cm    LVOT Diameter 2.1 cm    LVPWd 1.2 (A) 0.6 - 0.9 cm    LVOT Peak Gradient 5 mmHg    LVOT Mean Gradient 2 mmHg    LVOT SV 62.7 ml    LVOT Peak Velocity 1.1 m/s    LVOT VTI 18.1 cm    RVSP 30 mmHg    RV Free Wall Peak S' 11 cm/s    LA Diameter 4.0 cm    LA Volume A/L 119 mL  LA Volume A-L A4C 112 (A) 22 - 52 mL    LA Volume A-L A4C 122 (A) 22 - 52 mL    LA Volume MOD A2C 109 (A) 22 - 52 mL    LA Volume MOD A4C 102 (A) 22 - 52 mL    Est. RA Pressure 3 mmHg    AV Area by Peak Velocity 2.4 cm2    AV Area by VTI 2.3 cm2    AV Peak Gradient 10 mmHg    AV Mean Gradient 4 mmHg    AV Peak Velocity 1.6 m/s    AV Mean Velocity 0.9 m/s    AV VTI 25.9 cm    MV A Velocity 0.75 m/s    MV E Wave Deceleration Time 286.8 ms    MV E Velocity 0.57 m/s    LV E' Lateral Velocity 7 cm/s    LV E' Septal Velocity 4 cm/s    MV PHT 83.2 ms    MV Area by PHT 2.6 cm2    MV Area by VTI 2.2 cm2    MR Peak Gradient 34 mmHg    MR Peak Velocity 2.9 m/s    MV Peak Gradient 4 mmHg    MV Mean Gradient 1 mmHg    MV Max Velocity 1.0 m/s    MV Mean Velocity 0.4 m/s    MV VTI 28.8 cm    PV Peak Gradient 4 mmHg    PV Max Velocity 1.0 m/s    TAPSE 1.8 1.7 cm    TR Peak Gradient 27 mmHg    TR Max Velocity 2.59 m/s    Ascending Aorta 2.9 cm    Aortic Root 3.4 cm    Body Surface Area 1.62 m2    Fractional Shortening 2D 38 28 - 44 %    LVIDd Index 2.96 cm/m2    LVIDs Index 1.82 cm/m2    LV RWT Ratio 0.51      LV Mass 2D 199.6 (A) 67 - 162 g    LV Mass 2D Index 125.5 (A) 43 - 95 g/m2    MV E/A 0.76     E/E' Ratio (Averaged) 11.20     E/E' Lateral 8.14     E/E' Septal 14.25     LA Volume Index A/L 75 16 - 34 mL/m2    LVOT Stroke Volume Index 39.4 mL/m2    LVOT Area 3.5 cm2    LA Volume Index A-L A2C 70 (A) 16 - 34 mL/m2    LA Volume Index A-L A4C 77 (A) 16 - 34 mL/m2    LA Volume Index MOD A2C 69 (A) 16 - 34 ml/m2    LA Volume Index MOD A4C 64 (A) 16 - 34 ml/m2    LA Size Index 2.52 cm/m2    LA/AO Root Ratio 1.18     Ao Root Index 2.14 cm/m2    Ascending Aorta Index 1.82 cm/m2    AV Velocity Ratio 0.69     LVOT:AV VTI Index 0.70     AVA/BSA VTI 1.4 cm2/m2    AVA/BSA Peak Velocity 1.5 cm2/m2    UJ:WJXB VTI Index 1.59       Current medications reviewed       Tomasita Morrow, MD

## 2022-09-19 NOTE — Progress Notes (Signed)
[facility]                                                                                          Hospitalist Progress Note  Ronnie Derby, MD  Office Phone: [phone]        Date of Service:  09/19/2022  NAME:  Mary Woodward  DOB:  Oct 07, 1927  MRN:  [id number]       Admission Summary:   Earnie Arne is a 89 y.o. female with a pmhx atrial fibrillation, dyslipidemia, hypertension, past PE, osteoarthritis, and osteoporosis who presents from the [facility] nursing home with shortness of breath, and chest pain, and is being admitted for acute hypoxic respiratory failure secondary to acute heart failure, and A-fib with RVR.     She woke up at her facility short of breath, and was found to have SpO2 80% when EMS arrived.  They gave her Decadron, DuoNeb, and placed on CPAP, then transferred to [facility] ED for further evaluation.     In the ED, she was tachycardic to 116, with SpO2 93% on CPAP.  She was placed on BiPAP with FiO2 45.  Labs showed normocytic anemia with hemoglobin 11, BUN 41, creatinine 1.46, troponin 58, and proBNP 4123.  pO2 on ABG was 235 on BiPAP.  Chest x-ray shows congestive heart failure with interstitial pulmonary edema.     In the ED, she received IV Lasix 40 mg.       Interval history / Subjective:   Patient is seen and examined at bedside this AM. She feels ok. Off oxygen last night. Will hold lasix today as cr worsened  Spoke with son and daughter in law later in the room - pt much improved, on 2lNC/ RA , possible dc tomorrow   Discussed with nursing        Assessment & Plan:      #Acute Hypoxic respiratory failure - improving   #Acute Diastolic heart failure   - CXR: Congestive failure with interstitial pulmonary edema.   - Flu and covid- negative.   - off BiPAP   - Strict I&O, and daily weight  - appreciate cardiology input   - echo pending   - s/p IV lasix . Changed to po lasix from tomorrow   - saturating well on 2l NC/ RA    #pAF with  RVR  -continue metoprolol for rate control   -chads vasc >2  - not on anticoagulation due to falls      #Mild normocytic anemia  -Hgb 11, no baseline for comparison  -anemia w/u including stool occult  - iron profile ok      # Likely CKD stage 3- cr mildly worsened   - due to diuresis   -avoid renal toxins, and renally dose medications  -monitor renal function      #Dyslipidemia  -continue statin per hospital formulary     # Hx  PE  -not on anticoagulation     #GERD   -continue PPI     Code status: DNR  Prophylaxis: lovenox   Care Plan discussed with: pt/ family,  RN  Anticipated Disposition: TBD. ALF with HH vs SNF - possible tomorrow   Inpatient  Cardiac monitoring: Telemetry  Central Line:            Social Determinants of Health     Tobacco Use: Not on file   Alcohol Use: Not At Risk (09/17/2022)    AUDIT-C     Frequency of Alcohol Consumption: Never     Average Number of Drinks: Patient does not drink     Frequency of Binge Drinking: Never   Financial Resource Strain: Not on file   Food Insecurity: Not on file   Transportation Needs: Not on file   Physical Activity: Not on file   Stress: Not on file   Social Connections: Not on file   Intimate Partner Violence: Not on file   Depression: Not on file   Housing Stability: Not on file   Interpersonal Safety: Not At Risk (09/17/2022)    Interpersonal Safety Domain Source: IP Abuse Screening     Physical abuse: Denies     Verbal abuse: Denies     Emotional abuse: Denies     Financial abuse: Denies     Sexual abuse: Denies   Utilities: Not on file       Review of Systems:   A comprehensive review of systems was negative.       Vital Signs:    Last 24hrs VS reviewed since prior progress note. Most recent are:  Vitals:    09/19/22 1000   BP:    Pulse: (!) 101   Resp:    Temp:    SpO2:          Intake/Output Summary (Last 24 hours) at 09/19/2022 1120  Last data filed at 09/19/2022 0730  Gross per 24 hour   Intake 1050 ml   Output 2500 ml   Net -1450 ml          Physical  Examination:     I had a face to face encounter with this patient and independently examined them on 09/19/2022 as outlined below:          General : alert x 3, awake, no acute distress, elderly   HEENT: PEERL, EOMI, moist mucus membrane  Neck: supple, no JVD, no meningeal signs  Chest: Clear to auscultation bilaterally   CVS: S1 S2 heard, Capillary refill less than 2 seconds  Abd: soft/ non tender, non distended, BS physiological,   Ext: no clubbing, no cyanosis, no edema, brisk 2+ DP pulses  Neuro/Psych: pleasant mood and affect,  grossly intact  Skin: warm     Data Review:    I personally reviewed  Image      I have personally and independently reviewed all pertinent labs, diagnostic studies, imaging, and have provided independent interpretation of the same.     Labs:     Recent Labs     09/18/22  0540 09/19/22  0557   WBC 10.6 6.2   HGB 11.7 11.4*   HCT 36.6 33.7*   PLT 398 453*       Recent Labs     09/17/22  0437 09/18/22  0540 09/19/22  0557   NA 135* 134* 133*   K 4.2 4.9 4.2   CL 104 102 101   CO2 23 25 26    BUN 41* 44* 53*   MG 2.6*  --   --        Recent Labs     09/17/22  0437  ALT 16   GLOB 4.1*       No results for input(s): "INR", "APTT" in the last 72 hours.    Invalid input(s): "PTP"   Recent Labs     09/17/22  0437   TIBC 447        No results found for: "RBCF"   No results for input(s): "PH", "PCO2", "PO2" in the last 72 hours.  No results for input(s): "CPK" in the last 72 hours.    Invalid input(s): "CPKMB", "CKNDX", "TROIQ"  No results found for: "CHOL", "CHLST", "CHOLV", "HDL", "HDLC", "LDL"  No results found for: "GLUCPOC"  @LABUA @    Notes reviewed from all clinical/nonclinical/nursing services involved in patient's clinical care. Care coordination discussions were held with appropriate clinical/nonclinical/ nursing providers based on care coordination needs.         Patients current active Medications were reviewed, considered, added and adjusted based on the clinical condition today.       Home Medications were reconciled to the best of my ability given all available resources at the time of admission. Route is PO if not otherwise noted.      Admission Status:[id number]:::1}      Medications Reviewed:     Current Facility-Administered Medications   Medication Dose Route Frequency    [START ON 09/20/2022] furosemide (LASIX) tablet 20 mg  20 mg Oral Daily    pantoprazole (PROTONIX) tablet 40 mg  40 mg Oral QAM AC    sodium chloride flush 0.9 % injection 5-40 mL  5-40 mL IntraVENous 2 times per day    sodium chloride flush 0.9 % injection 5-40 mL  5-40 mL IntraVENous PRN    0.9 % sodium chloride infusion   IntraVENous PRN    ondansetron (ZOFRAN-ODT) disintegrating tablet 4 mg  4 mg Oral Q8H PRN    Or    ondansetron (ZOFRAN) injection 4 mg  4 mg IntraVENous Q6H PRN    polyethylene glycol (GLYCOLAX) packet 17 g  17 g Oral Daily PRN    acetaminophen (TYLENOL) tablet 650 mg  650 mg Oral Q6H PRN    Or    acetaminophen (TYLENOL) suppository 650 mg  650 mg Rectal Q6H PRN    metoprolol succinate (TOPROL XL) extended release tablet 25 mg  25 mg Oral QPM    aspirin EC tablet 81 mg  81 mg Oral Daily    enoxaparin Sodium (LOVENOX) injection 30 mg  30 mg SubCUTAneous Daily     ______________________________________________________________________  EXPECTED LENGTH OF STAY: 3  ACTUAL LENGTH OF STAY:          2                 Ronnie Derby, MD

## 2022-09-20 LAB — BASIC METABOLIC PANEL W/ REFLEX TO MG FOR LOW K
Anion Gap: 7 mmol/L (ref 5–15)
BUN/Creatinine Ratio: 34 — ABNORMAL HIGH (ref 12–20)
BUN: 47 MG/DL — ABNORMAL HIGH (ref 6–20)
CO2: 24 mmol/L (ref 21–32)
Calcium: 9.3 MG/DL (ref 8.5–10.1)
Chloride: 100 mmol/L (ref 97–108)
Creatinine: 1.4 MG/DL — ABNORMAL HIGH (ref 0.55–1.02)
Est, Glom Filt Rate: 35 mL/min/{1.73_m2} — ABNORMAL LOW (ref >60)
Glucose: 104 mg/dL — ABNORMAL HIGH (ref 65–100)
Potassium: 3.8 mmol/L (ref 3.5–5.1)
Sodium: 131 mmol/L — ABNORMAL LOW (ref 136–145)

## 2022-09-20 LAB — CBC WITH AUTO DIFFERENTIAL
Basophils %: 1 % (ref 0–1)
Basophils Absolute: 0 10*3/uL (ref 0.0–0.1)
Eosinophils %: 1 % (ref 0–7)
Eosinophils Absolute: 0 10*3/uL (ref 0.0–0.4)
Hematocrit: 34 % — ABNORMAL LOW (ref 35.0–47.0)
Hemoglobin: 11.4 g/dL — ABNORMAL LOW (ref 11.5–16.0)
Immature Granulocytes %: 0 % (ref 0.0–0.5)
Immature Granulocytes Absolute: 0 10*3/uL (ref 0.00–0.04)
Lymphocytes %: 20 % (ref 12–49)
Lymphocytes Absolute: 1.7 10*3/uL (ref 0.8–3.5)
MCH: 26.1 PG (ref 26.0–34.0)
MCHC: 33.5 g/dL (ref 30.0–36.5)
MCV: 78 FL — ABNORMAL LOW (ref 80.0–99.0)
MPV: 9.7 FL (ref 8.9–12.9)
Monocytes %: 14 % — ABNORMAL HIGH (ref 5–13)
Monocytes Absolute: 1.2 10*3/uL — ABNORMAL HIGH (ref 0.0–1.0)
Neutrophils %: 64 % (ref 32–75)
Neutrophils Absolute: 5.8 10*3/uL (ref 1.8–8.0)
Nucleated RBCs: 0 PER 100 WBC
Platelets: 394 10*3/uL (ref 150–400)
RBC: 4.36 M/uL (ref 3.80–5.20)
RDW: 16.3 % — ABNORMAL HIGH (ref 11.5–14.5)
WBC: 8.8 10*3/uL (ref 3.6–11.0)
nRBC: 0 10*3/uL (ref 0.00–0.01)

## 2022-09-20 LAB — COVID-19, RAPID: SARS-CoV-2, Rapid: NOT DETECTED

## 2022-09-20 MED ORDER — PANTOPRAZOLE SODIUM 40 MG PO TBEC
40 MG | ORAL_TABLET | Freq: Every day | ORAL | 0 refills | Status: AC
Start: 2022-09-20 — End: 2023-06-06

## 2022-09-20 MED ORDER — EMPAGLIFLOZIN 10 MG PO TABS
10 MG | ORAL_TABLET | Freq: Every day | ORAL | 0 refills | Status: AC
Start: 2022-09-20 — End: ?

## 2022-09-20 MED ORDER — FUROSEMIDE 20 MG PO TABS
20 MG | ORAL_TABLET | Freq: Every day | ORAL | 0 refills | Status: AC
Start: 2022-09-20 — End: 2022-10-11

## 2022-09-20 MED FILL — JARDIANCE 10 MG PO TABS: 10 MG | ORAL | Qty: 1

## 2022-09-20 MED FILL — ASPIRIN LOW DOSE 81 MG PO TBEC: 81 MG | ORAL | Qty: 1

## 2022-09-20 MED FILL — LOVENOX 30 MG/0.3ML IJ SOSY: 30 MG/0.3ML | INTRAMUSCULAR | Qty: 0.3

## 2022-09-20 MED FILL — FUROSEMIDE 40 MG PO TABS: 40 MG | ORAL | Qty: 1

## 2022-09-20 MED FILL — PANTOPRAZOLE SODIUM 40 MG PO TBEC: 40 MG | ORAL | Qty: 1

## 2022-09-20 NOTE — Progress Notes (Signed)
Referral source:   Aralee Okubo at [facility] in [facility] [address]. Chaplain attended rounds in the [facility] as part of the Interdisciplinary team where the patient's ongoing care was discussed. I reviewed the medical record as part of this encounter.     Outcome: Interdisciplinary team are aware of chaplain availability and were encouraged to request support as needed.      Advised nurse to contact [facility] for any further referrals.The chaplain on-call can be reached at [phone]).     Tashe Purdon. Scotty Court, MDiv, [facility]  Staff Chaplain

## 2022-09-20 NOTE — Plan of Care (Signed)
Problem: Discharge Planning  Goal: Discharge to home or other facility with appropriate resources  Outcome: Progressing     Problem: Pain  Goal: Verbalizes/displays adequate comfort level or baseline comfort level  Outcome: Progressing     Problem: Safety - Adult  Goal: Free from fall injury  Outcome: Progressing     Problem: Skin/Tissue Integrity  Goal: Absence of new skin breakdown  Description: 1.  Monitor for areas of redness and/or skin breakdown  2.  Assess vascular access sites hourly  3.  Every 4-6 hours minimum:  Change oxygen saturation probe site  4.  Every 4-6 hours:  If on nasal continuous positive airway pressure, respiratory therapy assess nares and determine need for appliance change or resting period.  Outcome: Progressing     Problem: ABCDS Injury Assessment  Goal: Absence of physical injury  Outcome: Progressing

## 2022-09-20 NOTE — Other (Addendum)
Heart Failure Nurse Navigator rounds completed.    HF NN provided introduction to self and daughter to the nurse navigator role. [facility] Discharge Packet for Patients Diagnosed with Heart Failure booklet given to patient, along with weight calendar, signs/symptoms magnet, and card with QR codes for HF video resources.     Educated using teach back method.  Discussed diagnosis definition and assessed patient understanding.  Reviewed importance of daily weight monitoring and low sodium diet (1500-2000mg  daily).    Daughter [place] lives in [place] and goes to the dining hall for her meals. Verbalizes concern about the sodium content in many of the foods that are served there.  HF NN discussed some foods with higher levels of hidden sodium (ie canned foods, prepackaged foods, restaurant food, etc.)  Encouraged patient to read labels and attempt to stay under 2000 mg of sodium daily.    Referral to dietician placed for additional information.  Patient states she does not have a scale but does have access to one at her facility. HF NN went over the importance of daily weights to be able to keep track of fluid building up.  Advised patient that if he sees a gain of 3 lbs over night or 5 lbs in one week he should call his cardiologist to make aware and get instructions.    Advised patient that follow up appointment will be scheduled and placed on Discharge Instructions prior to discharge. Follow up appt scheduled with Dr Chung/VCS for 09/23/22 @ 3pm @ the [facility] location. AVS updated.  Patient given [facility] flyer and is agreeable to services as needed.  Patient provided with contact information for HF NN and encouraged to call with questions. Will continue to follow until discharge.        Time spent with patient discussing HF education:  20 min

## 2022-09-20 NOTE — Progress Notes (Signed)
Cardiology Progress Note  09/20/2022     Admit Date: 09/17/2022  Admit Diagnosis: Acute pulmonary edema (HCC) [J81.0]  Paroxysmal atrial fibrillation (HCC) [I48.0]  Acute respiratory failure with hypoxia (HCC) [J96.01]  Atrial fibrillation, unspecified type (HCC) [I48.91]  Acute congestive heart failure, unspecified heart failure type (HCC) [I50.9]  Acute hypoxic respiratory failure (HCC) [J96.01]  CC: none currently    Assessment:   Principal Problem:    Acute hypoxic respiratory failure (HCC)  Resolved Problems:    * No resolved hospital problems. *    Plan:       Acute respiratory failure  Improved, DOE has resolved     Acute diastolic CHF  GDMT limited by BP, renal function as of yet  DC on furosemide 20 mg po once daily and Jardiance     Paroxysmal afib: currently sinus with PACs  Not on anticoagulation due to fall risk; I've asked them to consider Watchman  Continue PO metoprolol      OK to discharge from CV standpoint.    Subjective:      Mary Woodward denies undue DOE or CP.      Objective:    Physical Exam:  Overall VSSAF;  BP (!) 149/67   Pulse 97   Temp 98.3 F (36.8 C) (Oral)   Resp 17   Ht 1.524 m (5')   Wt 62 kg (136 lb 11.2 oz)   SpO2 94%   BMI 26.70 kg/m   Temp (24hrs), Avg:98 F (36.7 C), Min:97.5 F (36.4 C), Max:98.3 F (36.8 C)    Patient Vitals for the past 8 hrs:   Pulse   09/20/22 1200 97   09/20/22 1000 96   09/20/22 0800 82   09/20/22 0755 78   09/20/22 0600 81      Patient Vitals for the past 8 hrs:   Resp   09/20/22 0800 17   09/20/22 0755 21      Patient Vitals for the past 8 hrs:   BP   09/20/22 0918 (!) 149/67   09/20/22 0800 (!) 149/67   09/20/22 0755 (!) 148/68        05/18 1901 - 05/20 0700  In: 1120 [P.O.:1120]  Out: 2200 [Urine:2200]      General Appearance: Well developed, well nourished, no acute distress.   Ears/Nose/Mouth/Throat:   Normal MM; anicteric.     JVP: WNL   Resp:    Nl resp effort.   Cardiovascular:  RRR       Extremities: No edema bilaterally.     Skin:  Neuro: Warm and dry.  A/O x3, grossly nonfocal                         Data Review:       Labs:   Recent Results (from the past 24 hour(s))   Echo (TTE) complete (PRN contrast/bubble/strain/3D)    Collection Time: 09/19/22  2:15 PM   Result Value Ref Range    IVSd 1.1 (A) 0.6 - 0.9 cm    LVIDd 4.7 3.9 - 5.3 cm    LVIDs 2.9 cm    LVOT Diameter 2.1 cm    LVPWd 1.2 (A) 0.6 - 0.9 cm    LVOT Peak Gradient 5 mmHg    LVOT Mean Gradient 2 mmHg    LVOT SV 62.7 ml    LVOT Peak Velocity 1.1 m/s    LVOT VTI 18.1 cm    RVSP 30 mmHg  RV Free Wall Peak S' 11 cm/s    LA Diameter 4.0 cm    LA Volume A/L 119 mL    LA Volume A-L A4C 112 (A) 22 - 52 mL    LA Volume A-L A4C 122 (A) 22 - 52 mL    LA Volume MOD A2C 109 (A) 22 - 52 mL    LA Volume MOD A4C 102 (A) 22 - 52 mL    Est. RA Pressure 3 mmHg    AV Area by Peak Velocity 2.4 cm2    AV Area by VTI 2.3 cm2    AV Peak Gradient 10 mmHg    AV Mean Gradient 4 mmHg    AV Peak Velocity 1.6 m/s    AV Mean Velocity 0.9 m/s    AV VTI 25.9 cm    MV A Velocity 0.75 m/s    MV E Wave Deceleration Time 286.8 ms    MV E Velocity 0.57 m/s    LV E' Lateral Velocity 7 cm/s    LV E' Septal Velocity 4 cm/s    MV PHT 83.2 ms    MV Area by PHT 2.6 cm2    MV Area by VTI 2.2 cm2    MR Peak Gradient 34 mmHg    MR Peak Velocity 2.9 m/s    MV Peak Gradient 4 mmHg    MV Mean Gradient 1 mmHg    MV Max Velocity 1.0 m/s    MV Mean Velocity 0.4 m/s    MV VTI 28.8 cm    PV Peak Gradient 4 mmHg    PV Max Velocity 1.0 m/s    TAPSE 1.8 1.7 cm    TR Peak Gradient 27 mmHg    TR Max Velocity 2.59 m/s    Ascending Aorta 2.9 cm    Aortic Root 3.4 cm    Body Surface Area 1.62 m2    Fractional Shortening 2D 38 28 - 44 %    LVIDd Index 2.96 cm/m2    LVIDs Index 1.82 cm/m2    LV RWT Ratio 0.51     LV Mass 2D 199.6 (A) 67 - 162 g    LV Mass 2D Index 125.5 (A) 43 - 95 g/m2    MV E/A 0.76     E/E' Ratio (Averaged) 11.20     E/E' Lateral 8.14     E/E' Septal 14.25     LA Volume Index A/L 75 16 - 34 mL/m2    LVOT Stroke  Volume Index 39.4 mL/m2    LVOT Area 3.5 cm2    LA Volume Index A-L A2C 70 (A) 16 - 34 mL/m2    LA Volume Index A-L A4C 77 (A) 16 - 34 mL/m2    LA Volume Index MOD A2C 69 (A) 16 - 34 ml/m2    LA Volume Index MOD A4C 64 (A) 16 - 34 ml/m2    LA Size Index 2.52 cm/m2    LA/AO Root Ratio 1.18     Ao Root Index 2.14 cm/m2    Ascending Aorta Index 1.82 cm/m2    AV Velocity Ratio 0.69     LVOT:AV VTI Index 0.70     AVA/BSA VTI 1.4 cm2/m2    AVA/BSA Peak Velocity 1.5 cm2/m2    ZO:XWRU VTI Index 1.59    Basic Metabolic Panel w/ Reflex to MG    Collection Time: 09/20/22  4:26 AM   Result Value Ref Range    Sodium 131 (L) 136 -  145 mmol/L    Potassium 3.8 3.5 - 5.1 mmol/L    Chloride 100 97 - 108 mmol/L    CO2 24 21 - 32 mmol/L    Anion Gap 7 5 - 15 mmol/L    Glucose 104 (H) 65 - 100 mg/dL    BUN 47 (H) 6 - 20 MG/DL    Creatinine 8.29 (H) 0.55 - 1.02 MG/DL    BUN/Creatinine Ratio 34 (H) 12 - 20      Est, Glom Filt Rate 35 (L) >60 ml/min/1.55m2    Calcium 9.3 8.5 - 10.1 MG/DL   CBC with Auto Differential    Collection Time: 09/20/22  4:26 AM   Result Value Ref Range    WBC 8.8 3.6 - 11.0 K/uL    RBC 4.36 3.80 - 5.20 M/uL    Hemoglobin 11.4 (L) 11.5 - 16.0 g/dL    Hematocrit 56.2 (L) 35.0 - 47.0 %    MCV 78.0 (L) 80.0 - 99.0 FL    MCH 26.1 26.0 - 34.0 PG    MCHC 33.5 30.0 - 36.5 g/dL    RDW 13.0 (H) 86.5 - 14.5 %    Platelets 394 150 - 400 K/uL    MPV 9.7 8.9 - 12.9 FL    Nucleated RBCs 0.0 0 PER 100 WBC    nRBC 0.00 0.00 - 0.01 K/uL    Neutrophils % 64 32 - 75 %    Lymphocytes % 20 12 - 49 %    Monocytes % 14 (H) 5 - 13 %    Eosinophils % 1 0 - 7 %    Basophils % 1 0 - 1 %    Immature Granulocytes % 0 0.0 - 0.5 %    Neutrophils Absolute 5.8 1.8 - 8.0 K/UL    Lymphocytes Absolute 1.7 0.8 - 3.5 K/UL    Monocytes Absolute 1.2 (H) 0.0 - 1.0 K/UL    Eosinophils Absolute 0.0 0.0 - 0.4 K/UL    Basophils Absolute 0.0 0.0 - 0.1 K/UL    Immature Granulocytes Absolute 0.0 0.00 - 0.04 K/UL    Differential Type AUTOMATED     COVID-19, Rapid     Collection Time: 09/20/22 11:55 AM    Specimen: Nasopharyngeal   Result Value Ref Range    Source Nasopharyngeal      SARS-CoV-2, Rapid Not detected NOTD        Current medications reviewed       Einar Grad, MD

## 2022-09-20 NOTE — Care Coordination-Inpatient (Addendum)
1304 - Received report [phone]. CM notified bedside RN. CM provided update for pt daughter. SW Danielle requested pt be transported prior to 1600 for adequate admission process. CM will continue to follow.     1242 - CM sent referral for HH to [facility] via [facility] (agency [name] is open with). CM received results for COVID Rapid and faxed it to [phone]. CM will continue to follow.     1000 - CM faxed updated clinicals to F: [phone]. Facility requested RAPID COVID. CM let Bedside RN and Charge RN know. Will notify MD to order. CM will continue to follow.     CM spoke to pt daughter in regards to discharge disposition. CM provided update that PT/OT recommend HH. Daughter states the [name] provides therapy. CM will follow up to determine which agency provides therapy.     CM spoke with daughter as well to determine transportation plans. PT/OT stated that pt is safe to transfer with pt daughter. CM called SW at [facility] and provided HIPAA compliant voicemail to determine plan of care. CM will continue to follow.

## 2022-09-20 NOTE — Progress Notes (Signed)
Physician Progress Note      PATIENT:               Mary Woodward, Mary Woodward  CSN #:                  [id number]  DOB:                       May 10, 1927  ADMIT DATE:       09/17/2022 4:23 AM  DISCH DATE:  RESPONDING  PROVIDER #:        Ronnie Derby MD          QUERY TEXT:    Patient admitted with Acute on Chronic CHF. Noted documentation of acute   respiratory failure in the documentation. There is documentation per the ER   provider that patient has normal pulmonary effort, also no acute distress is   documented.  In order to support the diagnosis of acute respiratory failure, please include   additional clinical indicators in your documentation that indicts and   increased work of breathing.  Or please document if the diagnosis of acute   respiratory failure has been ruled out after further study.    The medical record reflects the following:  Risk Factors: Acute CHF    Clinical Indicators:  VS on adm: 97.4, 116, 24, 155/86, 93 on Bipap    5/17 ABG: pH 7.38  pCO2 33  pO2 235  O2 Sat 99% on bipap    5/17 CXR- CHF w/ interstitial pul edema    ER- SOB and c/p, cough, O2 Sats 80% on r/a at SNF, audible wheezing. Was tx   with duoneb and prednisone, placed on CPAP per EMS.  Tachycardia present.  Pulmonary: Effort: Pulmonary effort is normal.  Breath sounds: Rhonchi present. assess:  1. Acute congestive heart failure, unspecified heart failure type (HCC)  2. Acute pulmonary edema (HCC)  3. Acute respiratory failure with hypoxia (HCC)  4. Atrial fibrillation, unspecified type (HCC)    H&P- no acute distress, chest clear. assess: Acute Hypoxic respiratory   failure, Acute on chronic heart failure    5/17 PN: Acute Hypoxic respiratory failure - improving  Acute Diastolic heart failure- CXR: Congestive failure with interstitial   pulmonary edema.    5/18 PN: Acute Hypoxic respiratory failure - improving, Acute Diastolic heart   failure, on 4L    Treatment: Bipap, now weaned to room air, Lasix IV for diuresis    Acute Respiratory  Failure Clinical Indicators per 34M MS-DRG Training Guide and   Quick Reference Guide:  pO2 < 60 mmHg or SpO2 (pulse oximetry) < 91% breathing room air  pCO2 > 50 and pH < 7.35  P/F ratio (pO2 / FIO2) < 300  pO2 decrease or pCO2 increase by 10 mmHg from baseline (if known)  Supplemental oxygen of 40% or more  Presence of respiratory distress, tachypnea, dyspnea, shortness of breath,   wheezing  Unable to speak in complete sentences  Use of accessory muscles to breathe  Extreme anxiety and feeling of impending doom  Tripod position  Confusion/altered mental status/obtunded    Thank you,  Rica Koyanagi RN  Clinical Documentation  [phone], or via [employer]  Options provided:  -- Acute Respiratory Failure as evidenced by, Please document evidence.  -- Acute Respiratory Failure ruled out after study  -- Other - I will add my own diagnosis  -- Disagree - Not applicable / Not valid  -- Disagree - Clinically unable  to determine / Unknown  -- Refer to Clinical Documentation Reviewer    PROVIDER RESPONSE TEXT:    This patient is in acute respiratory failure as evidenced by SpO2 (pulse   oximetry) < 91% breathing room air    Query created by: Rica Koyanagi on 09/20/2022 12:53 PM      Electronically signed by:  Ronnie Derby MD 09/20/2022 12:56 PM

## 2022-09-20 NOTE — Discharge Instructions (Addendum)
Heart Failure Nutrition Therapy (2023)  This nutrition therapy for heart failure focuses on limiting sodium in your diet to manage your heart failure symptoms and maintaining your body weight.   You may need to limit fluid in your diet. Your registered dietitian nutritionist (RDN) will provide you with the Fluid Restriction Nutrition Therapy handout if you need to limit your fluid intake.  Sodium  Salt (sodium) makes your body hold water. You may feel shortness of breath and experience swelling when your body is holding water. You can help to prevent these symptoms by eating less salt. You may need to limit the sodium you get from food and drinks to less than 2,300 milligrams per day.  Read the nutrition label to find out how much sodium is in 1 serving of a food. Select foods with 140 milligrams of sodium or less per serving.  Foods with more than 300 milligrams of sodium per serving may not fit into a reduced-sodium meal plan depending on your other food selections. Your RDN can help you determine which foods fit into your eating plan.    Check serving sizes. If you eat more than 1 serving, you will get more sodium than the amount listed.  Tips for Limiting Sodium in Your Diet  How to limit sodium Strategies   Avoid processed foods    Choose fresh and frozen fruits and vegetables without added juices or sauces. These foods are naturally low in sodium.     Choose fresh meats. These foods are lower in sodium than processed meats, such as bacon, sausage, and hot dogs. Read the nutrition label to help you find fresh meat that is low in sodium.   Use less salt at the table and when cooking Leave the salt out of recipes for pasta, casseroles, and soups. Just 1 teaspoon of table salt has 2,300 milligrams of sodium.  Ask your RDN how to cook your favorite recipes without sodium.   Understand manufacturer claims about sodium Choose food packages that include the following claims about sodium:    "Salt-free" or  "sodium-free": These foods contain less than 5 milligrams of sodium per serving.  "Very-low-sodium": These foods contain less than 35 milligrams of sodium per serving.  "Low-sodium": These foods contain less than 140 milligrams of sodium per serving.  Use caution with foods labeled "unsalted" or "no added salt." These foods may still be high in sodium.   Add flavors to your food without adding sodium Use lemon juice, lime juice, fruit juice, or vinegar.  Add flavor with dry or fresh herbs such as basil, bay leaf, dill, rosemary, parsley, sage, dry mustard, nutmeg, thyme, and paprika.  Add spice using black pepper, red pepper flakes, and cayenne pepper.  Hot sauce contains sodium but using just a drop or two will not add much sodium to your foods.  Use a homemade or store-bought sodium-free seasoning blend.   Choose foods carefully when you eat outside your home Seek out nutrition information. Restaurant foods can be very high in sodium. Many restaurants provide nutrition facts on their menus or websites or upon request.  Let your server know that you want your food to be cooked without salt.  Ask for your salad dressing and sauces to come on the side.   Guidance for Weight Monitoring  Your RDN can help you determine your recommended weight.  Weigh yourself each day in the morning. Unexpected weight gain in a short period of time may be a sign that fluid is  building up in your body.  Contact your health care provider if you gain 3 or more pounds in 1-2 days or 5 or more pounds within 1 week. Your health care provider may adjust your medicine to help manage fluid buildup.  The following tips may help if you can't eat enough, have lost weight, or need extra calories and protein in your diet:  Plan to eat 3 meals and 3 snacks daily. Several small meals and snacks are often better tolerated and digested than large meals.  Keep high-calorie and high-protein snacks available for when you get hungry.  Add calories and  protein to your meals and snacks.  Add olive oil, other vegetable oils, butter, or margarine to soups, vegetables, potatoes, cooked cereal, rice, pasta, bread, crackers, pancakes, or waffles.  Drink milk, chocolate milk, soy milk, or smoothies instead of low-calorie beverages such as diet drinks or water.  Add milk powder to protein shakes, cereal, or casseroles.  Add peanut butter or nut butters, mayonnaise, or honey to foods.  Drink high-calorie beverages such as oral nutrition supplements or milkshakes.  Your RDN can provide you with the NCM High-Calorie, High-Protein Nutrition Therapy handout for more tips on increasing calories and protein in your diet.       Foods Recommended and Not Recommended  Food Group Foods Recommended Foods Not Recommended   Grains Breads with less than 80 mg sodium per slice  Homemade bread made with reduced sodium baking soda  Cereal such as shredded wheat and puffed rice  Oats, grits, cream of wheat  Pasta, noodles, quinoa, rice  Unsalted popcorn, pretzels, and crackers Breads or crackers topped with salt  Cereals (hot/cold) with more than 300 milligrams sodium per serving  Biscuits, cornbread, and other "quick" breads prepared with baking soda  Prepackaged bread crumbs  Pre-seasoned rice and noodles  Self-rising flours  Salted pretzels and popcorn   Protein Foods Fresh meats, poultry, and fish  Eggs or egg beaters (if less than 200 mg per serving)    [place] bacon (if not packaged in a sodium solution)  Canned or packed tuna (no more than 4 ounces at 1 serving)  Unsalted beans, lentils, or peas  Soy foods such as tofu, tempeh, or unsalted soy nuts  Unsalted nuts or nut butter Cured meats: bacon, ham, sausage, pepperoni, salt pork, and hot dogs  Canned meats: chili, [place] sausage, sardines, and ham  Smoked fish and meats   Dairy and Dairy Alternatives Milk or milk powder  Fortified soy milk  Yogurt, including [place] and soy yogurt  Small amounts of natural reduced-sodium cheese  (Swiss, ricotta, and fresh mozzarella are lower in sodium than others)  Cream cheese  Low-sodium cottage cheese Buttermilk  Processed cheeses   Cottage cheese (unless a low-sodium variety)  Feta cheese; shredded cheese (has more sodium than block cheese); singles slices and string cheese   Vegetables Fresh and frozen vegetables without added sauces, salt, or sodium  Low-sodium or sodium-free canned vegetables Canned vegetables that are high in sodium  Frozen vegetables with seasoning and sauces  Sauerkraut, pickles, and other pickled vegetables such as kimchi  [place] fries and onion rings that contain sodium   Fruit Fresh, canned, or dried fruits    Dried fruits preserved with sodium-containing additives   Fats and Oils Unsaturated vegetable oils, including olive, peanut, and canola oils  Vegetable oil-based margarines and spreads  Low sodium salad dressing and mayonnaise made from unsaturated vegetable oils  Simple salad dressings (vinegar and oil)  Unsalted butter and margarine Salted butter or margarine  Olives      Condiments Fresh or dried herbs  Low-sodium ketchup  Vinegar  Lemon or lime juice  Pepper  Salt-free seasoning mixes and marinades (salt-free seasoning blend)  Salt-free sauces Salt, sea salt, kosher salt  Onion and/or garlic salt  Seasoning mixes containing salt (lemon pepper or bouillon cubes)  Catsup or ketchup  BBQ sauce  [place] and soy sauce  Salsa  Pickles  Relish  Salad dressings: ranch, blue cheese, [place], and [place]    Other Homemade soups (salt free or low sodium)  Frozen meals that have less than 600 milligrams sodium  Unsalted chips  Sodium-free bouillon cubes Canned or dried soups (that are not salt free or low sodium)  Frozen meals that have more than 600 milligrams sodium  Regular potato chips and other salty snack foods  Regular bouillon cubes     Heart Failure (Undernourished) Sample 1-Day Menu View Nutrient Info  Breakfast 1 egg, scrambled  1 cup oatmeal  1 banana  2  tablespoons salt-free peanut butter  1 cup whole milk   Morning Snack  cup almonds, unsalted  2 tablespoon cranberries  1 cup oral nutritional supplement   Lunch Sandwich made with: 2 slices whole wheat bread  3 ounces low-sodium [place] breast  1 slice low-sodium cheese  1 tablespoon mayonnaise  1 orange  1 cup whole milk   Afternoon Snack 10 tortilla chips, unsalted   cup avocado  1 cup water   Evening Meal 3 ounces grilled chicken breast  1 baked potato  2 ounces low-sodium cheese, for potato  2 tablespoons salt-free butter, for potato  2 cup lettuce, for salad   cup cucumbers, for salad   cup carrots, for salad  2 tablespoons vinegar and oil dressing, for salad  1 cup apple juice   Evening Snack 1 cup oral nutritional supplement

## 2022-09-20 NOTE — Consults (Incomplete)
Nutrition Education    ***    Maggie Schwalbe, RD  Contact Number: ***

## 2022-09-20 NOTE — Other (Signed)
HEART FAILURE NURSE NAVIGATOR NOTE  [facility]    Patient chart was reviewed by Heart Failure (HF) Nurse Navigators for compliance of prescribed treatment with guidelines directed medical therapy (GDMT) and HF database completed.     Please, review beneath recommendations for symptomatic patients with HF with Preserved Ejection Fraction ? 50% (HFpEF) for your consideration when taking care of this patient.    Current HF Medical Therapy:      Name Mary Woodward   DOB 19-Apr-1928   LVEF 50-55%   NYHA Functional Class Not documented   ARNi/ACEi/ARB    Aldosterone Antagonist    SGLT2 inhibitor Jardiance 10mg  qd   Consulting Cardiologist VCS     Recommendations for HF Management:    For patients with HFpEF ? 50%, consider adding the following GDMT, if appropriate:  SGLT2 inhibitor [Class 2a]  ARNi or ARB [Class 2b]  Aldosterone antagonist [Class 2b]  Adjust antihypertensive therapy [Class 1]  Adjust diuretic dose at discharge if hospitalized for volume overload [Class 1]  For patient with hyperkalemia while on RAASi > 5.5, consider adding potassium binders (patiromer, sodium zirconium cycosilicate) [Class 2a]    Patients with suspected cardiac amyloidosis (older > 46 years old with LVH > 1.2cm and/or any other signs of amyloidosis) should be offered screening labs and imaging [Class 1]: (a) serum gammopathy profile and UPEP with immunofixation, and (b) PYP test. If PYP test is positive patient should have genetic testing done for inherited ATTR amyloidosis. If any findings are positive or you need genetic testing ordered, please, consider in-patient consultation or referral to [facility].      Note that the following medications may be potentially harmful in heart failure [Class 3].  Calcium channel blockers (doxazosin, diltiazem, verapamil, nifedipine)  Antiarrhythmics (flecanide, disopyrimide, sotalol, dronedarone)  Diabetes medications (thiasolidinediones,  saxagliptin, alogliptin)  NSAIDs and COX 2 inhibitors    Consider vaccinations for respiratory illnesses (flu, pneumonia, covid) [Class 2b]    Due to h/o recurrent hospitalizations this patient may benefit from referral to Advanced Heart Failure Program to assess suitability for advanced therapies, such as left-ventricular assist device, heart transplantation, palliative inotropes or palliation [Class 1]. To obtain in-patient consultation or refer to [facility], call [phone]    Patient Heart Failure Education:     Heart failure education to be provided including information about medical therapy, lifestyle modifications, diet and fluid restrictions, physical activity.  Educational resources to be provided: "AHA Discharge packet for patients diagnosed with Heart Failure booklet; Signs/Symptoms magnet; Weight Calendar; [facility] flyer.  Information to be provided about HF support group.  Learning about self-care for Heart Failure on discharge instructions.      Plan for Transitional Care:    Post discharge follow up phone call to be made within 48-72 hours of discharge.  Patient will follow-up with PCP.  Patient will follow-up with Primary Cardiologist.  Patient screened for obstructive sleep apnea and referred to Sleep Medicine.  Referral/follow-up with Cardiac Rehabilitation.  Referral/follow-up with Advanced Heart Failure Specialist.  Referral/follow-up with Palliative Care Specialist.        Heart Failure Nurse Navigator  Phone: [phone] / [phone]    *Recommendations listed above are based on the guidelines: 2022 AHA/ACC/HFSA Guideline for the Management of Heart Failure: A Report of the [employer] of Cardiology/American Heart Association Joint Committee on Clinical Practice Guidelines. Circulation 2022; [id number]. and 2017 AHA/ACC/HRS guideline for management of patients with ventricular  arrhythmias and the prevention of sudden cardiac death: A Report of the  [employer] of Cardiology/American Heart Association Task Force on Clinical Practice Guidelines and the Heart Rhythm Society. Heart Rhythm, Vol 15, No 10, October 2018 *Class of Recommendation: Class 1 (strong), Class 2a (moderate), Class 2b (weak), Class 3 (not recommended, potentially harmful)

## 2022-09-20 NOTE — Discharge Summary (Signed)
Discharge Summary       PATIENT ID: Mary Woodward  MRN: [id number]   DATE OF BIRTH: 18-Mar-1928    DATE OF ADMISSION: 09/17/2022  4:23 AM    DATE OF DISCHARGE: 09/20/2022   PRIMARY CARE PROVIDER: Darrell Jewel, MD     ATTENDING PHYSICIAN: Dr. Tyson Dense   DISCHARGING PROVIDER: Ronnie Derby, MD    To contact this individual call [phone] and ask the operator to page.  If unavailable ask to be transferred the Adult Hospitalist Department.    CONSULTATIONS: IP CONSULT TO CARDIOLOGY  IP CONSULT TO CASE MANAGEMENT    PROCEDURES/SURGERIES: * No surgery found *    DIAGNOSES & HOSPITAL COURSE:   Per HPI:"Mary Woodward is a 89 y.o. female with a pmhx atrial fibrillation, dyslipidemia, hypertension, past PE, osteoarthritis, and osteoporosis who presents from the [facility] nursing home with shortness of breath, and chest pain, and is being admitted for acute hypoxic respiratory failure secondary to acute heart failure, and A-fib with RVR.     She woke up at her facility short of breath, and was found to have SpO2 80% when EMS arrived.  They gave her Decadron, DuoNeb, and placed on CPAP, then transferred to [facility] ED for further evaluation.     In the ED, she was tachycardic to 116, with SpO2 93% on CPAP.  She was placed on BiPAP with FiO2 45.  Labs showed normocytic anemia with hemoglobin 11, BUN 41, creatinine 1.46, troponin 58, and proBNP 4123.  pO2 on ABG was 235 on BiPAP.  Chest x-ray shows congestive heart failure with interstitial pulmonary edema.     In the ED, she received IV Lasix 40 mg."    #Acute Hypoxic respiratory failure - improving   #Acute Diastolic heart failure   - CXR: Congestive failure with interstitial pulmonary edema.   - Flu and covid- negative.   - off BiPAP   - Strict I&O, and daily weight  - appreciate cardiology input   - echo: EF of 50 - 55%.  Increased wall thickness. Diastolic dysfunction present with normal LV EF.   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  - s/p  IV lasix . Changed to po lasix on discharge   - saturating well on RA     #pAF with RVR  -continue metoprolol for rate control   -chads vasc >2  - not on anticoagulation due to falls      #Mild normocytic anemia  -Hgb 11, no baseline for comparison  -anemia w/u including stool occult  - iron profile ok      # Likely CKD stage 3- cr at baseline   - due to diuresis   -avoid renal toxins, and renally dose medications  -monitor renal function      #Dyslipidemia  -continue statin per hospital formulary     # Hx  PE  -not on anticoagulation     #GERD   -continue PPI     Discharged to ALF with [facility]    PENDING TEST RESULTS:   At the time of discharge the following test results are still pending: none     FOLLOW UP APPOINTMENTS:    @DCFOLLOWUP @   PCP in 1-2 weeks   Cardiology in 2 weeks     ADDITIONAL CARE RECOMMENDATIONS:   Daily weight   BMP in 1 week     DIET: cardiac diet 1800-214ml fluids per day   Oral Nutritional Supplements:     ACTIVITY: activity as tolerated  DISCHARGE MEDICATIONS:     Medication List        START taking these medications      empagliflozin 10 MG tablet  Commonly known as: JARDIANCE  Take 1 tablet by mouth daily  Start taking on: Sep 21, 2022     furosemide 20 MG tablet  Commonly known as: LASIX  Take 1 tablet by mouth daily  Start taking on: Sep 21, 2022     pantoprazole 40 MG tablet  Commonly known as: PROTONIX  Take 1 tablet by mouth every morning (before breakfast)  Start taking on: Sep 21, 2022  Replaces: pantoprazole 40 MG injection            CONTINUE taking these medications      aspirin 81 MG chewable tablet     lovastatin 10 MG tablet  Commonly known as: MEVACOR     metoprolol succinate 25 MG extended release tablet  Commonly known as: TOPROL XL     PreserVision AREDS 2 Chew            STOP taking these medications      pantoprazole 40 MG injection  Commonly known as: PROTONIX  Replaced by: pantoprazole 40 MG tablet               Where to Get Your Medications        Information about  where to get these medications is not yet available    Ask your nurse or doctor about these medications  empagliflozin 10 MG tablet  furosemide 20 MG tablet  pantoprazole 40 MG tablet           NOTIFY YOUR PHYSICIAN FOR ANY OF THE FOLLOWING:   Fever over 101 degrees for 24 hours.   Chest pain, shortness of breath, fever, chills, nausea, vomiting, diarrhea, change in mentation, falling, weakness, bleeding. Severe pain or pain not relieved by medications.  Or, any other signs or symptoms that you may have questions about.    DISPOSITION:   X Home With:   OT  PT X HH  RN       Long term SNF/Inpatient Rehab    Independent/assisted living    Hospice    Other:       PATIENT CONDITION AT DISCHARGE:     Functional status    Poor    X Deconditioned     Independent      Cognition     Lucid     Forgetful     Dementia      Catheters/lines (plus indication)    Foley     PICC     PEG     None      Code status     Full code    X DNR      PHYSICAL EXAMINATION AT DISCHARGE:    General : alert x 3, awake, no acute distress, elderly   HEENT: PEERL, EOMI, moist mucus membrane  Neck: supple, no JVD, no meningeal signs  Chest: Clear to auscultation bilaterally   CVS: S1 S2 heard, Capillary refill less than 2 seconds  Abd: soft/ Non tender, non distended, BS physiological,   Ext: no clubbing, no cyanosis, no edema, brisk 2+ DP pulses  Neuro/Psych: pleasant mood and affect,  grossly intact  Skin: warm     CHRONIC MEDICAL DIAGNOSES:      Greater than 31 minutes were spent with the patient on counseling and coordination of care    Signed:   Dolores Hoose  Mary Straw, MD  09/20/2022  10:03 AM

## 2022-09-21 NOTE — Care Coordination-Inpatient (Signed)
Care Transitions Note    Initial Call - Call within 2 business days of discharge: Yes     Mary Woodward BPCI-A Initial Transitions of Care Call    Patient Current Location:  Home: [address]  [place] [address]    Patient is enrolled in the [facility], anchor discharge date 09/20/22.  Discharged  to ILF, with resumption of care with [facility] .  Confirmed patient insurance is Medicare A and B as primary: Yes  Confirmed that [facility] Care Management department provided patient BPCI-A letter prior to discharge: Yes    BPCI-A CTN contacted the patient and daughter by telephone to perform post hospital discharge assessment, listed number for Mary Woodward was not working, spoke with daughter, Mary Woodward, (no PHI on file) verified name and DOB as identifiers. She was able to give me a working number to reach parent. Spoke with Mary Woodward, veried name and DOB as identifiers, provided introduction to self, explanation of the CTN role, and explanation of the BPCI-A program to both patient and daughter.      Patient: Mary Woodward   Patient DOB: December 17, 1927   MRN: <[id number]>    Reason for Admission: progressive SOB and chest pain  Discharge Date: 09/20/22 RURS: Readmission Risk Score: 14.3       Last Discharge Facility       Date Complaint Diagnosis Description Type Department Provider    09/17/22 Respiratory Distress; Chest Pain Acute congestive heart failure, unspecified heart failure type (HCC) ... ED to Hosp-Admission (Discharged) (ADMITTED) [id number] Mary Derby, MD; Mary Woodward...            Additional needs identified to be addressed with provider   Standard priority: PCP sees her at ILF. Next day at ILF will be Wednesday, 09/22/22.     Pharmacy working on securing coding to fill new [employer].     Mary Woodward states she eats breakfast in her apartment, goes downstairs and the nurses will weigh daily- late morning, on the way to dining room for lunch.   She remains SOB-DOE,  can walk to BR using RW without stopping to rest.     New order for [facility], the office to clarify if she will have HH vs. Return to facility based OP therapy at [facility].         Method of communication with provider: chart routing to PCP.    Patients top risk factors for readmission: lack of knowledge about disease, medical condition-HFpEF, HTN, and resides in ILF- eats meals from dining room.    Interventions to address risk factors:   Education: teach back done on mechanism of action for new Jardiance, daily monitoring items- signs of infection/change in urination.   Review of patient management of conditions/medications: self administers medications, she does not check BP/HR values, does not have a working scale in apartment. Staff at [facility] ILF are checking weights late morning. Brief conversation about not adding salt to meals/foods.   Home Health: [facility]- office states this is a new order. Asked to clarify question - see above yellow box.  If [facility] services in place, asked for assistance with securing scale in apartment, BP and HR checks to review with cardiology.    Communication with providers: PCP- he sees her onsite at ILF.     Care Summary Note:   Received return call from daughter, Mary Woodward- she reports mom is doing much better than prior to admission, "must have been building  up over a while". States she feels the staff at [facility] will be keeping a good eye on her.   Spoke with Mary Woodward, she is getting dressed and will be heading to nurses station to get weighed, then to dining room for breakfast. All her meals come from dining room, but she eats breakfast in her apartment. She does not have a scale, BP cuff.   She reports SOB- able to walk to BR using RW without resting. Mary Woodward agreed for CTN to call back to check on her.     Care Transition Nurse reviewed discharge instructions and medical action plan with patient and attempted with daughter, Mary Woodward . The  patient was given an opportunity to ask questions; questions regarding additional clinical needed to fill Jardiance RX sent to provider for clarification.. The patient verbalized understanding. , demonstrated understanding using teach back method. , and needs reinforcement of information discussed.   Were discharge instructions available to patient? Yes.   Reviewed appropriate site of care based on symptoms and resources available to patient including: PCP  Specialist  After hours contact number-cardiology  Urgent care clinics  Home health  [facility] . The patient agrees to contact the primary care provider and/or specialist office for questions related to their healthcare.    Daily Weight: patient agrees to weigh daily. and patient has not weighed.   Refer to above yellow box.     Zone:(Pt Reported)  yellow    Last Echo Results (see below):   09/17/22    ECHO (TTE) COMPLETE (PRN CONTRAST/BUBBLE/STRAIN/3D) 09/19/2022  5:44 PM (Final)    Interpretation Summary    Left Ventricle: Reduced left ventricular systolic function with a visually estimated EF of 50 - 55%. Left ventricle size is normal. Increased wall thickness. Diastolic dysfunction present with normal LV EF.    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    Left Atrium: Left atrium is dilated.    Image quality is adequate.    Signed by: Mary Pizza, MD on 09/19/2022  5:44 PM      Type of Heart Failure: HFpEF     Cardiac device present: none      Heart Failure Medications: Betablocker Toprol, SGLT2 inhibitor Jardiance , Diuretic furosemide, and ASA 81 mg.        Advance Care Planning:  Does patient have an Advance Directive: deferred at this time, will discuss on future follow up.  and no AMD on file .    Medication Reconciliation:  Medication reconciliation was performed with patient,1111F entered: N/A.     Remote Patient Monitoring:  Remote Patient Monitoring (RPM) program for in-home monitoring: NA- non [facility]  cardiology.    Assessments:  Care Transitions 24 Hour Call    Do you have support at home?: Alone  Care Transitions Interventions         Goals Addressed                   This Visit's Progress     Conditions and Symptoms        I will schedule office visits, as directed by my provider.  I will keep my appointment or reschedule if I have to cancel.  I will notify my provider of any barriers to my plan of care.  I will follow my Zone Management tool to seek urgent or emergent care.  I will notify my provider of any symptoms that indicate a worsening of my condition.    Barriers: lack  of education and lives in a ILF environment: all meals from dining room, does not have working scale and BP cuff  Plan for overcoming my barriers: asked her to consider getting scale and BP cuff to monitor values in apartment.   Confidence: 7/10  Anticipated Goal Completion Date: 819/24                 Follow Up Appointment:   Discussed follow up appointments. Patient has hospital follow up appointment scheduled within 7 days of discharge.   No future appointments.  PCP- Dr. Renato Battles, sees her at [name]. CTN updated provider for possible TOC visit on 5/22.   External Appointments:   Cardiology- Dr. Tommy Medal- 5/23 at 3 pm     BPCI-A CTN provided contact information.  Plan for follow-up call in 5-7 days based on severity of symptoms and risk factors.  Plan for next call:   Assess current symptoms- SOB-DOE  Status of HH vs. Facility based therapy services  PCP saw on 5/22?   Attended cardiology appt on 09/23/22  Check on BP and HR, scale- secured for use in apartment  Ask about AMD    [name], RN   Direct:  [phone]

## 2022-09-22 LAB — CULTURE, BLOOD 1: Culture: NO GROWTH

## 2022-09-22 LAB — CULTURE, BLOOD 2: Culture: NO GROWTH

## 2022-10-05 ENCOUNTER — Inpatient Hospital Stay
Admission: EM | Admit: 2022-10-05 | Discharge: 2022-10-11 | Disposition: A | Discharge status: Other Acute Inpatient Hospital | Payer: Medicare Other | Admitting: Internal Medicine

## 2022-10-05 ENCOUNTER — Emergency Department: Admit: 2022-10-05 | Payer: MEDICARE | Primary: Internal Medicine

## 2022-10-05 DIAGNOSIS — I130 Hypertensive heart and chronic kidney disease with heart failure and stage 1 through stage 4 chronic kidney disease, or unspecified chronic kidney disease: Secondary | ICD-10-CM

## 2022-10-05 DIAGNOSIS — I509 Heart failure, unspecified: Secondary | ICD-10-CM

## 2022-10-05 LAB — COMPREHENSIVE METABOLIC PANEL
ALT: 105 U/L — ABNORMAL HIGH (ref 12–78)
AST: 63 U/L — ABNORMAL HIGH (ref 15–37)
Albumin/Globulin Ratio: 0.9 — ABNORMAL LOW (ref 1.1–2.2)
Albumin: 3.4 g/dL — ABNORMAL LOW (ref 3.5–5.0)
Alk Phosphatase: 96 U/L (ref 45–117)
Anion Gap: 8 mmol/L (ref 5–15)
BUN/Creatinine Ratio: 22 — ABNORMAL HIGH (ref 12–20)
BUN: 45 MG/DL — ABNORMAL HIGH (ref 6–20)
CO2: 27 mmol/L (ref 21–32)
Calcium: 9.3 MG/DL (ref 8.5–10.1)
Chloride: 101 mmol/L (ref 97–108)
Creatinine: 2.03 MG/DL — ABNORMAL HIGH (ref 0.55–1.02)
Est, Glom Filt Rate: 23 mL/min/{1.73_m2} — ABNORMAL LOW (ref >60)
Globulin: 3.7 g/dL (ref 2.0–4.0)
Glucose: 122 mg/dL — ABNORMAL HIGH (ref 65–100)
Potassium: 4.1 mmol/L (ref 3.5–5.1)
Sodium: 136 mmol/L (ref 136–145)
Total Bilirubin: 0.5 MG/DL (ref 0.2–1.0)
Total Protein: 7.1 g/dL (ref 6.4–8.2)

## 2022-10-05 LAB — BRAIN NATRIURETIC PEPTIDE: NT Pro-BNP: 12324 PG/ML — ABNORMAL HIGH (ref <450)

## 2022-10-05 LAB — CBC WITH AUTO DIFFERENTIAL
Basophils %: 0 % (ref 0–1)
Basophils Absolute: 0 10*3/uL (ref 0.0–0.1)
Eosinophils %: 0 % (ref 0–7)
Eosinophils Absolute: 0 10*3/uL (ref 0.0–0.4)
Hematocrit: 30.9 % — ABNORMAL LOW (ref 35.0–47.0)
Hemoglobin: 9.9 g/dL — ABNORMAL LOW (ref 11.5–16.0)
Immature Granulocytes %: 0 % (ref 0.0–0.5)
Immature Granulocytes Absolute: 0 10*3/uL (ref 0.00–0.04)
Lymphocytes %: 16 % (ref 12–49)
Lymphocytes Absolute: 1.2 10*3/uL (ref 0.8–3.5)
MCH: 25.8 PG — ABNORMAL LOW (ref 26.0–34.0)
MCHC: 32 g/dL (ref 30.0–36.5)
MCV: 80.5 FL (ref 80.0–99.0)
MPV: 9.6 FL (ref 8.9–12.9)
Monocytes %: 9 % (ref 5–13)
Monocytes Absolute: 0.7 10*3/uL (ref 0.0–1.0)
Neutrophils %: 75 % (ref 32–75)
Neutrophils Absolute: 5.9 10*3/uL (ref 1.8–8.0)
Nucleated RBCs: 0 PER 100 WBC
Platelets: 316 10*3/uL (ref 150–400)
RBC: 3.84 M/uL (ref 3.80–5.20)
RDW: 17.4 % — ABNORMAL HIGH (ref 11.5–14.5)
WBC: 7.9 10*3/uL (ref 3.6–11.0)
nRBC: 0 10*3/uL (ref 0.00–0.01)

## 2022-10-05 LAB — EXTRA TUBES HOLD

## 2022-10-05 MED ORDER — DILTIAZEM HCL 25 MG/5ML IV SOLN
25 | Freq: Once | INTRAVENOUS | Status: AC
Start: 2022-10-05 — End: 2022-10-05
  Administered 2022-10-05: 15 mg via INTRAVENOUS

## 2022-10-05 MED ORDER — DILTIAZEM HCL 125 MG/25ML IV SOLN
12525 MG/25ML | INTRAVENOUS | Status: AC
Start: 2022-10-05 — End: 2022-10-06
  Administered 2022-10-06: 02:00:00 5 mg/h via INTRAVENOUS

## 2022-10-05 MED ORDER — MAGNESIUM SULFATE 2000 MG/50 ML IVPB PREMIX
2 | Freq: Once | INTRAVENOUS | Status: AC
Start: 2022-10-05 — End: 2022-10-05
  Administered 2022-10-05: 2000 mg via INTRAVENOUS

## 2022-10-05 MED FILL — MAGNESIUM SULFATE 2 GM/50ML IV SOLN: 2 GM/50ML | INTRAVENOUS | Qty: 50

## 2022-10-05 MED FILL — DILTIAZEM HCL 25 MG/5ML IV SOLN: 25 MG/5ML | INTRAVENOUS | Qty: 5

## 2022-10-05 MED FILL — DILTIAZEM HCL 125 MG/25ML IV SOLN: 125 MG/25ML | INTRAVENOUS | Qty: 125

## 2022-10-05 NOTE — H&P (Signed)
History and Physical    Date of Service:  10/05/2022  Primary Care Provider: Darrell Jewel, MD  Source of information: The patient and Chart review    Chief Complaint: Shortness of Breath      History of Presenting Illness:   Mary Woodward is a 89 y.o. female with past medical history of paroxysmal atrial fibrillation, HFpEF, pulmonary edema, hypertension, hyperlipidemia, PE, osteoarthritis, osteoporosis presented to emergency department with chief complaint of shortness of breath, weight gain and mostly right ankle swelling.  Patient was previously hospitalized with similar complaints of shortness of breath from 09/17/2022 to 09/20/2022 with diagnosis of A-fib RVR, heart failure preserved ejection fraction, acute hypoxic respiratory failure secondary to heart failure preserved ejection fraction, acute pulmonary edema, and A-fib with RVR.  During the hospitalization, patient was treated with BiPAP and Lasix IV.  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  Patient is now on anticoagulation due to fall risks.  Patient continues with aforementioned symptoms which remains constant, moderate to severe, without specific alleviating factors.  She denies chest pain.  On arrival in ED today, initial recorded vital signs temperature 97.6 F, BP 105/55, heart rate 157, respiratory rate 16, O2 saturation 99% room air.  12-lead EKG showed A-fib RVR, PVCs, nonspecific intraventricular block, prolonged QTc 539 ms, ST/T wave changes in the anterior and inferior lateral leads at 130 bpm.  Chest x-ray portable showed resolving pulmonary edema.  Abnormal labs included proBNP 12,324, high-sensitivity troponin 76, hemoglobin 9.9, BUN 45, creatinine 2.03, GFR 23, blood glucose 122, albumin 3.4, ALT 105, AST 63.  ED ordered diltiazem 15 mg IV bolus injection followed by diltiazem continuous IV titrated infusion, Lasix 20 mg IV, magnesium  sulfate 2000 mg IV, 0.9% normal saline 250 mL IV fluid bolus x 1, and aspirin 325 mg p.o. x 1 dose.  Patient will is now seen for admission to the hospitalist service.  However prior to arrival at the bedside, nurse notes that patient is now hypotension with SBP in 80s.  Diltiazem IV drip was discontinued.         REVIEW OF SYSTEMS:  Pertinent items are noted in the History of Present Illness.     PAST MEDICAL HISTORY:  Paroxysmal atrial fibrillation  Chronic HFpEF  Pulmonary edema  Hypertension  Hyperlipidemia  PE  Osteoarthritis  Osteoporosis     MEDICATIONS:  Prior to Admission medications    Medication Sig Start Date End Date Taking? Authorizing Provider   empagliflozin (JARDIANCE) 10 MG tablet Take 1 tablet by mouth daily 09/21/22   Ronnie Derby, MD   furosemide (LASIX) 20 MG tablet Take 1 tablet by mouth daily 09/21/22   Ronnie Derby, MD   pantoprazole (PROTONIX) 40 MG tablet Take 1 tablet by mouth every morning (before breakfast) 09/21/22   Ronnie Derby, MD   aspirin 81 MG chewable tablet Take 1 tablet by mouth daily    [provider]   lovastatin (MEVACOR) 10 MG tablet Take 1 tablet by mouth nightly    [provider]   metoprolol succinate (TOPROL XL) 25 MG extended release tablet Take 1 tablet by mouth every evening    [provider]   Multiple Vitamins-Minerals (PRESERVISION AREDS 2) CHEW Take 1 tablet by mouth in the morning and at bedtime    [provider]     ALLERGIES:  No known drug allergies    FAMILY HISTORY:  SOCIAL HISTORY:  Smoking: Reported quit smoking cigarettes as a teenager  Alcohol: Occasional glass of wine  Illicit drugs: None    Social Determinants of Health     Tobacco Use: Not on file   Alcohol Use: Not At Risk (10/05/2022)    AUDIT-C     Frequency of Alcohol Consumption: Never     Average Number of Drinks: Patient does not drink     Frequency of Binge Drinking: Never   Financial Resource Strain: Not on file   Food Insecurity: Not on file    Transportation Needs: Not on file   Physical Activity: Not on file   Stress: Not on file   Social Connections: Not on file   Intimate Partner Violence: Not on file   Depression: Not on file   Housing Stability: Not on file   Interpersonal Safety: Not At Risk (09/17/2022)    Interpersonal Safety Domain Source: IP Abuse Screening     Physical abuse: Denies     Verbal abuse: Denies     Emotional abuse: Denies     Financial abuse: Denies     Sexual abuse: Denies   Utilities: Not on file        Medications were reconciled to the best of my ability given all available resources at the time of admission. Route is PO if not otherwise noted.     Family and social history were personally reviewed, all pertinent and relevant details are outlined as above.    Objective:   BP 93/79 (MAP 85)   Pulse (!) 120   Temp 97.6 F (36.4 C) (Oral)   Resp 20  Wt 61.4 kg (135 lb 5.8 oz)   SpO2 92% on room air   BMI 26.44 kg/m         PHYSICAL EXAM:   General: Patient in no acute respiratory distress.  HEENT: Normocephalic, atraumatic, no otorrhea, no rhinorrhea, nares patent, PEERL, EOMI, moist mucus membranes.  Tongue midline/nonedematous.  Neck: Supple, positive for JVD, no meningeal signs.  No carotid bruits.  Trachea midline.  Respiratory/ Chest: Fine basilar rales to auscultation bilaterally   CVS: Tachycardic.  Irregular rhythm.  No murmurs, rubs, or gallops.  GI/ Abd: Soft.  Nontender.  Nondistended.  No rebound, guarding, or rigidity.  Normoactive bowel sounds.  No auscultated abdominal bruits or palpable abdominal mass.  Musculoskeletal/Ext: Trace nonpitting edema of right ankle/right foot.  Otherwise no edema bilateral upper/left lower extremity.  No acute bony/soft tissue deformity.   Neuro: GCS 15.  Moves all extremities x 4.  Strength 4/5 bilateral upper/lower extremities.  No slurred speech.  No facial droop.  Sensation grossly intact.    Psych: A&Ox4.    Cap refill: Brisk, less than 3 seconds  Vascular/ Pulses: 2+  radial, 1+ dorsalis pedis pulse bilateral.  Integument/ Skin: Warm, dry, without rashes or lesions     Data Review:   I have independently reviewed and interpreted patient's lab and all other diagnostic data    Abnormal Labs Reviewed   CBC WITH AUTO DIFFERENTIAL - Abnormal; Notable for the following components:       Result Value    Hemoglobin 9.9 (*)     Hematocrit 30.9 (*)     MCH 25.8 (*)     RDW 17.4 (*)     All other components within normal limits   COMPREHENSIVE METABOLIC PANEL - Abnormal; Notable for the following components:    Glucose 122 (*)     BUN 45 (*)  Creatinine 2.03 (*)     BUN/Creatinine Ratio 22 (*)     Est, Glom Filt Rate 23 (*)     ALT 105 (*)     AST 63 (*)     Albumin 3.4 (*)     Albumin/Globulin Ratio 0.9 (*)     All other components within normal limits   BRAIN NATRIURETIC PEPTIDE - Abnormal; Notable for the following components:    NT Pro-BNP 12,324 (*)     All other components within normal limits   TROPONIN - Abnormal; Notable for the following components:    Troponin, High Sensitivity 76 (*)     All other components within normal limits       @MICRORESULTS @    IMAGING:   XR CHEST PORTABLE   Final Result   Resolving pulmonary edema.                 ECG/ECHO:  @LASTCARDIOLOGY @       Notes reviewed from all clinical/nonclinical/nursing services involved in patient's clinical care. Care coordination discussions were held with appropriate clinical/nonclinical/ nursing providers based on care coordination needs.     Assessment:   Given the patient's current clinical presentation, there is a high level of concern for decompensation if discharged from the emergency department. Complex decision making was performed, which includes reviewing the patient's available past medical records, laboratory results, and imaging studies.    Principal Problem:    Atrial fibrillation with rapid ventricular response (HCC)  Resolved Problems:    * No resolved hospital problems. *      Plan:     Atrial  fibrillation with rapid ventricular response (HCC)  -now with hypotension  -Diltiazem IV infusion now discontinued due to hypotension  -no Amiodarone ordered due to prolonged Qtc  -initially admitted to IMCU/ telemetry floor  -consider for possible ICU transfer; consult ICU intensivist for evaluation  -consult cardiologist  -considered for Digoxin but not yet ordered due to elevated creatinine  -CHA2 DS2 VASc= 4 (CHF= 1, Age=> 77= 2, sex female = 1)  -Per last discharge summary note, patient on anticoagulation due to falls  -no current cardioversion as patient is not anticoagulated and is currently asymptomatic    2.  Hypotension  -currently patient is asymptomatic while at rest  -no additional IV fluids ordered due to acute pulmonary edema/ CHF  -would like to keep goal MAP > 65    3.  SOB (shortness of breath)  -place on supplemental oxygen  -continuous pulse oximetry monitoring  -keep goal O2sat > 94%    4.  Acute on chronic HFpEF (heart failure preserved ejection fraction)      Pulmonary edema  -proBNP 12,324  -Given Lasix 20 mg IV x 1 dose  -Tonight no additional diuretics ordered due to hypotension and elevated creatinine  -Place on neurochecks and fall precautions  -Strict I's and O's and daily weights  -Consider for guideline directed medical therapies which is currently limited due to hypotension and elevated creatinine  -Hold beta-blocker due to hypotension  -Hold ACE I/ARB/ SGLT2i due to elevated creatinine    5.  Edema of lower extremities  -Keep legs elevated at rest  -Plan as above    6.  Acute kidney injury superimposed on chronic kidney disease  -BUN 45, creatinine 2.03, GFR 23 on 10/05/2022  -Creatinine 1.40, GFR 35 on 09/20/2022  -Repeat renal panel in a.m.  -Consult nephrologist  -Order UA with microscopy     7.  Normocytic normochromic  anemia  -Hemoglobin 9.9  -Repeat H&H  -Transfuse if hemoglobin less than 7.0    8.  Elevated LFTs  -AST 63, ALT 105  -Repeat CMP in a.m.    9.  Elevated  troponin  -troponin = 76  -repeat / trend high sensitivity troponin levels    10. GERD  -continue PPI (Protonix)    11.  History of PE  -not on anticoagulation per prior chart note    12.  Hyperlipidemia  -check lipid panel  -continue statin  -May substitute atorvastatin for lovastatin which is not available on hospital formulary    DIET: ADULT DIET; Regular; Low Fat/Low Chol/High Fiber/NAS   ISOLATION PRECAUTIONS: No active isolations  CODE STATUS: Full Code   Central Line:   none  DVT PROPHYLAXIS: SCDs  FUNCTIONAL STATUS PRIOR TO HOSPITALIZATION: Ambulatory and capable of self-care but relies on assistive devices (rolling walker/cane).   Ambulatory status/function: Ambulates with assistance:  Walker   EARLY MOBILITY ASSESSMENT: Recommend routine ambulation while hospitalized with the assistance of nursing staff  ANTICIPATED DISCHARGE: Greater than 48 hours.  ANTICIPATED DISPOSITION: Home with Home Healthcare  EMERGENCY CONTACT/SURROGATE DECISION MAKER:   Cassie Freer (Child)  [phone] (Home Phone)       CRITICAL CARE WAS PERFORMED FOR THIS ENCOUNTER: YES. I had a face to face encounter with the patient, reviewed and interpreted patient data including clinical events, labs, images, vital signs, I/O's, and examined patient.  I have discussed the case and the plan and management of the patient's care with the consulting services, the bedside nurses and necessary ancillary providers.  This patient has a high probability of imminent, clinically significant deterioration, which requires the highest level of preparedness to intervene urgently. I participated in the decision-making and personally managed or directed the management of the following life and organ supporting interventions that required my frequent assessment to treat or prevent imminent deterioration.  I personally spent 60 minutes of critical care time.  This is time spent at this critically ill patient's bedside actively involved in patient care as  well as the coordination of care and discussions with the patient's family.  This does not include any procedural time which has been billed separately.    ADVANCED DIRECTIVE/ CODE STATUS:  FULL CODE as per discussion with patient.   Signed By: Kara Pacer, MD     October 05, 2022         Please note that this dictation may have been completed with [employer], the computer voice recognition software.  Quite often unanticipated grammatical, syntax, homophones, and other interpretive errors are inadvertently transcribed by the computer software.  Please disregard these errors.  Please excuse any errors that have escaped final proofreading.

## 2022-10-05 NOTE — ED Notes (Signed)
MD Pegram notified about Pt being Hypotensive BP 99/67. Orders received to stop dilt gtt. Care ongoing.

## 2022-10-05 NOTE — ED Provider Notes (Signed)
[facility] EMERGENCY DEP  EMERGENCY DEPARTMENT ENCOUNTER      Pt Name: Mary Woodward  MRN: [id number]  Birthdate June 26, 1927  Date of evaluation: 10/05/2022  Provider: Marguerite Olea, MD    CHIEF COMPLAINT       Chief Complaint   Patient presents with    Shortness of Breath         HISTORY OF PRESENT ILLNESS   (Location/Symptom, Timing/Onset, Context/Setting, Quality, Duration, Modifying Factors, Severity)  Note limiting factors.   48F w/ hx CHF, AF on DOAC, VTE, HTN p/w 5d SOB. Pt coming from SNF w/ 5d worsening SOB w/ activity and now speaking. Also increased BLE swelling, weight gain, palpitations, generalized fatigue/weakness and feeling lightheaded. NO chst or abd pain. No syncope. No F/C, N/V/D. Recently admitted here for new onset CHF. Compliant w/ meds including eliquis and lasix. LVEF 50-55% from TTE 09/19/2022.            Review of External Medical Records:     Nursing Notes were reviewed.    REVIEW OF SYSTEMS    (2-9 systems for level 4, 10 or more for level 5)     Review of Systems   Constitutional:  Negative for diaphoresis and fever.   HENT:  Negative for nosebleeds.    Eyes:  Negative for visual disturbance.   Respiratory:  Positive for shortness of breath. Negative for cough.    Cardiovascular:  Positive for palpitations and leg swelling. Negative for chest pain.   Gastrointestinal:  Negative for abdominal distention, abdominal pain, anal bleeding, blood in stool, diarrhea, nausea and vomiting.   Endocrine: Negative for polyuria.   Genitourinary:  Negative for difficulty urinating, dysuria, frequency and hematuria.   Musculoskeletal:  Negative for joint swelling.   Skin:  Negative for wound.   Allergic/Immunologic: Negative for immunocompromised state.   Neurological:  Negative for seizures and syncope.   Hematological:  Does not bruise/bleed easily.   Psychiatric/Behavioral:  Negative for confusion.        Except as noted above the remainder of the review of systems was reviewed and negative.       PAST  MEDICAL HISTORY   No past medical history on file.      SURGICAL HISTORY     No past surgical history on file.      CURRENT MEDICATIONS       Previous Medications    ASPIRIN 81 MG CHEWABLE TABLET    Take 1 tablet by mouth daily    EMPAGLIFLOZIN (JARDIANCE) 10 MG TABLET    Take 1 tablet by mouth daily    FUROSEMIDE (LASIX) 20 MG TABLET    Take 1 tablet by mouth daily    LOVASTATIN (MEVACOR) 10 MG TABLET    Take 1 tablet by mouth nightly    METOPROLOL SUCCINATE (TOPROL XL) 25 MG EXTENDED RELEASE TABLET    Take 1 tablet by mouth every evening    MULTIPLE VITAMINS-MINERALS (PRESERVISION AREDS 2) CHEW    Take 1 tablet by mouth in the morning and at bedtime    PANTOPRAZOLE (PROTONIX) 40 MG TABLET    Take 1 tablet by mouth every morning (before breakfast)       ALLERGIES     Patient has no known allergies.    FAMILY HISTORY     No family history on file.       SOCIAL HISTORY       Social History     Socioeconomic History    Marital status: Widowed  PHYSICAL EXAM    (up to 7 for level 4, 8 or more for level 5)     ED Triage Vitals [10/05/22 1848]   BP Temp Temp Source Pulse Respirations SpO2 Height Weight - Scale   (!) 105/55 97.6 F (36.4 C) Oral (!) 157 16 99 % -- 61.4 kg (135 lb 5.8 oz)       Body mass index is 26.44 kg/m.    Physical Exam  Constitutional:       General: She is not in acute distress.     Appearance: Normal appearance. She is not ill-appearing, toxic-appearing or diaphoretic.   HENT:      Head: Normocephalic.      Mouth/Throat:      Mouth: Mucous membranes are moist.      Pharynx: Oropharynx is clear. No oropharyngeal exudate or posterior oropharyngeal erythema.   Eyes:      General: No scleral icterus.     Extraocular Movements: Extraocular movements intact.      Conjunctiva/sclera: Conjunctivae normal.      Pupils: Pupils are equal, round, and reactive to light.   Cardiovascular:      Rate and Rhythm: Tachycardia present. Rhythm irregular.      Pulses: Normal pulses.      Heart sounds: No  murmur heard.     No friction rub.   Pulmonary:      Effort: Pulmonary effort is normal. Tachypnea present. No accessory muscle usage or respiratory distress.      Breath sounds: Normal breath sounds. No stridor. No decreased breath sounds, wheezing, rhonchi or rales.   Abdominal:      General: Abdomen is flat. There is no distension.      Palpations: Abdomen is soft.      Tenderness: There is no abdominal tenderness. There is no guarding or rebound.   Musculoskeletal:         General: No swelling.      Cervical back: Normal range of motion. No rigidity.      Right lower leg: Edema present.      Left lower leg: Edema present.   Skin:     General: Skin is warm and dry.      Capillary Refill: Capillary refill takes less than 2 seconds.      Coloration: Skin is not jaundiced.   Neurological:      General: No focal deficit present.      Mental Status: She is alert.      Cranial Nerves: No cranial nerve deficit.      Sensory: No sensory deficit.      Motor: No weakness.      Coordination: Coordination normal.         DIAGNOSTIC RESULTS     EKG: All EKG's are interpreted by the Emergency Department Physician who either signs or Co-signs this chart in the absence of a cardiologist.    EKG Interpretation  Wide complex AF w/ RVR, LBBB morphology (old LBBB), prolonged QTc    RADIOLOGY:   Interpretation per the Radiologist below, if available at the time of this note:    XR CHEST PORTABLE   Final Result   Resolving pulmonary edema.                 LABS:  Admission on 10/05/2022   Component Date Value Ref Range Status    Ventricular Rate 10/05/2022 138  BPM Preliminary    QRS Duration 10/05/2022 130  ms Preliminary    Q-T  Interval 10/05/2022 356  ms Preliminary    QTc Calculation (Bazett) 10/05/2022 539  ms Preliminary    R Axis 10/05/2022 53  degrees Preliminary    T Axis 10/05/2022 164  degrees Preliminary    Diagnosis 10/05/2022    Preliminary                    Value:Atrial fibrillation with rapid ventricular response with  premature   ventricular or aberrantly conducted complexes  Nonspecific intraventricular block  Cannot rule out Anterior infarct , age undetermined  T wave abnormality, consider inferolateral ischemia  Abnormal ECG  When compared with ECG of 17-Sep-2022 04:38,  Atrial fibrillation has replaced Sinus rhythm  T wave inversion now evident in Inferior leads      WBC 10/05/2022 7.9  3.6 - 11.0 K/uL Final    RBC 10/05/2022 3.84  3.80 - 5.20 M/uL Final    Hemoglobin 10/05/2022 9.9 (L)  11.5 - 16.0 g/dL Final    Hematocrit 16/02/9603 30.9 (L)  35.0 - 47.0 % Final    MCV 10/05/2022 80.5  80.0 - 99.0 FL Final    MCH 10/05/2022 25.8 (L)  26.0 - 34.0 PG Final    MCHC 10/05/2022 32.0  30.0 - 36.5 g/dL Final    RDW 54/01/8118 17.4 (H)  11.5 - 14.5 % Final    Platelets 10/05/2022 316  150 - 400 K/uL Final    MPV 10/05/2022 9.6  8.9 - 12.9 FL Final    Nucleated RBCs 10/05/2022 0.0  0 PER 100 WBC Final    nRBC 10/05/2022 0.00  0.00 - 0.01 K/uL Final    Neutrophils % 10/05/2022 75  32 - 75 % Final    Lymphocytes % 10/05/2022 16  12 - 49 % Final    Monocytes % 10/05/2022 9  5 - 13 % Final    Eosinophils % 10/05/2022 0  0 - 7 % Final    Basophils % 10/05/2022 0  0 - 1 % Final    Immature Granulocytes % 10/05/2022 0  0.0 - 0.5 % Final    Neutrophils Absolute 10/05/2022 5.9  1.8 - 8.0 K/UL Final    Lymphocytes Absolute 10/05/2022 1.2  0.8 - 3.5 K/UL Final    Monocytes Absolute 10/05/2022 0.7  0.0 - 1.0 K/UL Final    Eosinophils Absolute 10/05/2022 0.0  0.0 - 0.4 K/UL Final    Basophils Absolute 10/05/2022 0.0  0.0 - 0.1 K/UL Final    Immature Granulocytes Absolute 10/05/2022 0.0  0.00 - 0.04 K/UL Final    Differential Type 10/05/2022 AUTOMATED    Final    Sodium 10/05/2022 136  136 - 145 mmol/L Final    Potassium 10/05/2022 4.1  3.5 - 5.1 mmol/L Final    Chloride 10/05/2022 101  97 - 108 mmol/L Final    CO2 10/05/2022 27  21 - 32 mmol/L Final    Anion Gap 10/05/2022 8  5 - 15 mmol/L Final    Glucose 10/05/2022 122 (H)  65 - 100 mg/dL  Final    BUN 14/78/2956 45 (H)  6 - 20 MG/DL Final    Creatinine 21/30/8657 2.03 (H)  0.55 - 1.02 MG/DL Final    BUN/Creatinine Ratio 10/05/2022 22 (H)  12 - 20   Final    Est, Glom Filt Rate 10/05/2022 23 (L)  >60 ml/min/1.77m2 Final    Comment:    Pediatric calculator link: https://www.kidney.org/professionals/kdoqi/gfr_calculatorped     These results are not intended for use  in patients <71 years of age.     eGFR results are calculated without a race factor using  the 2021 CKD-EPI equation. Careful clinical correlation is recommended, particularly when comparing to results calculated using previous equations.  The CKD-EPI equation is less accurate in patients with extremes of muscle mass, extra-renal metabolism of creatinine, excessive creatine ingestion, or following therapy that affects renal tubular secretion.      Calcium 10/05/2022 9.3  8.5 - 10.1 MG/DL Final    Total Bilirubin 10/05/2022 0.5  0.2 - 1.0 MG/DL Final    ALT 16/02/9603 105 (H)  12 - 78 U/L Final    AST 10/05/2022 63 (H)  15 - 37 U/L Final    Alk Phosphatase 10/05/2022 96  45 - 117 U/L Final    Total Protein 10/05/2022 7.1  6.4 - 8.2 g/dL Final    Albumin 54/01/8118 3.4 (L)  3.5 - 5.0 g/dL Final    Globulin 14/78/2956 3.7  2.0 - 4.0 g/dL Final    Albumin/Globulin Ratio 10/05/2022 0.9 (L)  1.1 - 2.2   Final    NT Pro-BNP 10/05/2022 12,324 (H)  <450 PG/ML Final    Comment:    NT-proBNP is highly sensitive for the detection of acute congestive heart failure in dyspneic patients. A NT-proBNP result <300 pg/mL has a negative predictive value of 98% for acute heart failure.  Marked elevations in NT-proBNP levels may be observed in patients with left ventricular congestive failure, atrial fibrillation without clear heart failure, acute coronary syndromes, right heart strain/failure (including pulmonary embolism and cor pulmonale), critical illness, renal failure or advanced age. Falsely low NT-proBNP may be observed in obese CHF patients.     Recent  research  suggests the following cutoff values are appropriate based on patient age and clinical setting, reduce false positive (FP) results, and improve positive predictive values for acute heart failure:  Acutely dyspneic patient: age <50, use cutoff of 450 pg/mL  Acutely dyspneic patient: age 5-75, use cutoff of 900 pg/mL  Acutely dyspneic patient: age >66,                            use cutoff of 1800 pg/mL     FDA approved cutpoints for ruling-out heart failure in the office:  Ambulatory patient: age <72, use cutoff of 125 pg/mL  Ambulatory patient: age >62, use cutoff of 450 pg/mL      Specimen HOld 10/05/2022 1RED, 1BLU   Final    Comment: 10/05/2022 Add-on orders for these samples will be processed based on acceptable specimen integrity and analyte stability, which may vary by analyte.    Final    Troponin, High Sensitivity 10/05/2022 76 (H)  0 - 51 ng/L Final    Comment: A HS troponin value change of (+ or -) 50% or more below the 99th percentile, in a 1/2/3 hr interval represents a significant change. Clinical correlation is recommended.  A HS troponin value change of (+ or -) 20% or above the 99th percentile, in a 1/2/3 hr interval represents a significant change. Clinical correlation is recommended.  99th Percentile:   Women: 0-51 ng/L  Men:   0-76 ng/L  Patients taking more than 20 mg/day of biotin may have falsely negative results and should not use this test.           EMERGENCY DEPARTMENT COURSE and DIFFERENTIAL DIAGNOSIS/MDM:   Vitals:    Vitals:    10/05/22 2058 10/05/22 2215 10/05/22 2245 10/05/22 2300   BP:  96/85 (!) 89/73 99/67   Pulse: (!) 114 (!) 125 (!) 117 (!) 101   Resp:  18  19   Temp:       TempSrc:       SpO2:  95% 95% 94%   Weight:               Medical Decision Making  58F w/ hx CHF, AF on DOAC, VTE, HTN p/w 5d SOB, BLE swelling, wt gain, palpitations. LVEF 50-55% from TTE 09/19/2022. Pt frail but nontoxic. HR 150s w/ BP  110/50. NO hypoxia. Looks volume overloaded. Ddx includes PNA vs URI/bronchitis/flu/COVID19 vs COPD/asthma exacerbation vs decompensated CHF vs PNX vs pulm edema/pleural effusion vs valvular heart disease vs pulm fibrosis/HTN vs interstitial lung disease vs malignancy/mets vs ACS vs cardiac dysrythmia vs HTN emergency/urgency vs symptomatic anemia vs electrolyte/metabolic, less likely PE based on Wells/Geneva score, unlikely pericardial effusion/tamponade based on presentation. Ordered CXR, EKG, labs. Dilt for HR control. Monitor and reassess.    2000 Pt doing well. HR 90s w/ SBP 90s. EKG AF w/ RVR and LBBB (old). BNP 12300. Trop elevated 74. Acute on chronic renal failure w/ Cr 2.0. CXR mild pulm edema. Gave diltiazem bolus and now drip w/ IV Mg. Also gave lasix and small IVF bolus as likely intravascularly depleted. Cardiology consulted. Step down admit for acute CHF and AF RVR.    Amount and/or Complexity of Data Reviewed  Independent Historian: caregiver  Labs: ordered. Decision-making details documented in ED Course.  Radiology: ordered and independent interpretation performed. Decision-making details documented in ED Course.  ECG/medicine tests: ordered and independent interpretation performed. Decision-making details documented in ED Course.    Risk  OTC drugs.  Prescription drug management.  Decision regarding hospitalization.            ED Course            CONSULTS:  IP CONSULT TO CARDIOLOGY  IP CONSULT TO CARDIOLOGY    PROCEDURES:  Unless otherwise noted below, none     Procedures  Total critical care time (not including time spent performing separately reportable procedures): 58      FINAL IMPRESSION      1. Acute on chronic congestive heart failure, unspecified heart failure type (HCC)    2. Atrial fibrillation with RVR (HCC)    3. Acute renal failure superimposed on chronic kidney disease, unspecified acute renal failure type, unspecified CKD stage (HCC)    4. Elevated troponin           DISPOSITION/PLAN   DISPOSITION Admitted 10/05/2022 09:19:48 PM      PATIENT REFERRED TO:  No follow-up provider specified.    DISCHARGE MEDICATIONS:  New Prescriptions    No medications on file         Marguerite Olea, MD (electronically signed)  Emergency Attending Physician     Perfect Serve Consult for Admission  11:28 PM    ED Room Number: ER30/30  Patient Name and age:  Mary Woodward 89 y.o.  female  Working Diagnosis:   1. Acute on chronic congestive heart failure, unspecified heart failure type (HCC)  2. Atrial fibrillation with RVR (HCC)    3. Acute renal failure superimposed on chronic kidney disease, unspecified acute renal failure type, unspecified CKD stage (HCC)    4. Elevated troponin        Department: [facility] Adult ED - [phone]  Recommended Level of Care: step down  Readmission: No  Code Status:  Full Code  Sepsis present:  No  Reassessment needed: Yes  Isolation Requirements: no  COVID-19 Suspicion: No    Other:       Marguerite Olea, MD  10/05/22 2331

## 2022-10-05 NOTE — ED Triage Notes (Signed)
Patient presents from home with complaints of shortness of breath that started last night. Reports history of CHF and that she has had weight gain recently and swelling to her legs.

## 2022-10-05 NOTE — H&P (Incomplete)
History and Physical    Date of Service:  10/05/2022  Primary Care Provider: Darrell Jewel, MD  Source of information: {source of history:57567}    Chief Complaint: Shortness of Breath      History of Presenting Illness:   Mary Woodward is a 89 y.o. female who presents with ***.     The patient denies any headache, blurry vision, sore throat, trouble swallowing, trouble with speech, chest pain, SOB, cough, fever, chills, N/V/D, abd pain, urinary symptoms, constipation, recent travels, sick contacts, focal or generalized neurological symptoms, falls, injuries, rashes, contact with COVID-19 diagnosed patients, hematemesis, melena, hemoptysis, hematuria, rashes, denies starting any new medications and denies any other concerns or problems besides as mentioned above.         REVIEW OF SYSTEMS:  {Ros - complete:30011337}     No past medical history on file.   No past surgical history on file.  Prior to Admission medications    Medication Sig Start Date End Date Taking? Authorizing Provider   empagliflozin (JARDIANCE) 10 MG tablet Take 1 tablet by mouth daily 09/21/22   Ronnie Derby, MD   furosemide (LASIX) 20 MG tablet Take 1 tablet by mouth daily 09/21/22   Ronnie Derby, MD   pantoprazole (PROTONIX) 40 MG tablet Take 1 tablet by mouth every morning (before breakfast) 09/21/22   Ronnie Derby, MD   aspirin 81 MG chewable tablet Take 1 tablet by mouth daily    [provider]   lovastatin (MEVACOR) 10 MG tablet Take 1 tablet by mouth nightly    [provider]   metoprolol succinate (TOPROL XL) 25 MG extended release tablet Take 1 tablet by mouth every evening    [provider]   Multiple Vitamins-Minerals (PRESERVISION AREDS 2) CHEW Take 1 tablet by mouth in the morning and at bedtime    [provider]     No Known Allergies   No family history on file.   Social History:     Social Determinants of Health     Tobacco Use: Not on file   Alcohol Use: Not At Risk (10/05/2022)     AUDIT-C     Frequency of Alcohol Consumption: Never     Average Number of Drinks: Patient does not drink     Frequency of Binge Drinking: Never   Financial Resource Strain: Not on file   Food Insecurity: Not on file   Transportation Needs: Not on file   Physical Activity: Not on file   Stress: Not on file   Social Connections: Not on file   Intimate Partner Violence: Not on file   Depression: Not on file   Housing Stability: Not on file   Interpersonal Safety: Not At Risk (09/17/2022)    Interpersonal Safety Domain Source: IP Abuse Screening     Physical abuse: Denies     Verbal abuse: Denies     Emotional abuse: Denies     Financial abuse: Denies     Sexual abuse: Denies   Utilities: Not on file        Medications were reconciled to the best of my ability given all available resources at the time of admission. Route is PO if not otherwise noted.     Family and social history were personally reviewed, all pertinent and relevant details are outlined as above.    Objective:   BP 101/80   Pulse (!) 114   Temp 97.6 F (36.4 C) (Oral)   Resp 24  Wt 61.4 kg (135 lb 5.8 oz)   SpO2 93%   BMI 26.44 kg/m         PHYSICAL EXAM:   General: Alert x oriented x 3, awake, no acute distress,   HEENT: PEERL, EOMI, moist mucus membranes  Neck: Supple, no JVD, no meningeal signs  Chest: Clear to auscultation bilaterally   CVS: RRR, S1 S2 heard, no murmurs/rubs/gallops  Abd: Soft, non-tender, non-distended, +bowel sounds   Ext: No clubbing, no cyanosis, no edema  Neuro/Psych: Pleasant mood and affect, CN 2-12 grossly intact, sensory grossly within normal limit, Strength 5/5 in all extremities, DTR 1+ x 4  Cap refill: Brisk, less than 3 seconds  Pulses: 2+, symmetric in all extremities  Skin: Warm, dry, without rashes or lesions    Data Review:   I have independently reviewed and interpreted patient's lab and all other diagnostic data    Abnormal Labs Reviewed   CBC WITH AUTO DIFFERENTIAL - Abnormal; Notable for the following  components:       Result Value    Hemoglobin 9.9 (*)     Hematocrit 30.9 (*)     MCH 25.8 (*)     RDW 17.4 (*)     All other components within normal limits   COMPREHENSIVE METABOLIC PANEL - Abnormal; Notable for the following components:    Glucose 122 (*)     BUN 45 (*)     Creatinine 2.03 (*)     BUN/Creatinine Ratio 22 (*)     Est, Glom Filt Rate 23 (*)     ALT 105 (*)     AST 63 (*)     Albumin 3.4 (*)     Albumin/Globulin Ratio 0.9 (*)     All other components within normal limits   BRAIN NATRIURETIC PEPTIDE - Abnormal; Notable for the following components:    NT Pro-BNP 12,324 (*)     All other components within normal limits   TROPONIN - Abnormal; Notable for the following components:    Troponin, High Sensitivity 76 (*)     All other components within normal limits       @MICRORESULTS @    IMAGING:   XR CHEST PORTABLE   Final Result   Resolving pulmonary edema.                 ECG/ECHO:  @LASTCARDIOLOGY @       Notes reviewed from all clinical/nonclinical/nursing services involved in patient's clinical care. Care coordination discussions were held with appropriate clinical/nonclinical/ nursing providers based on care coordination needs.     Assessment:   Given the patient's current clinical presentation, there is a high level of concern for decompensation if discharged from the emergency department. Complex decision making was performed, which includes reviewing the patient's available past medical records, laboratory results, and imaging studies.    Principal Problem:    Atrial fibrillation with rapid ventricular response (HCC)  Resolved Problems:    * No resolved hospital problems. *      Plan:     ***        DIET: No diet orders on file   ISOLATION PRECAUTIONS: No active isolations  CODE STATUS: Prior   Central Line:     DVT PROPHYLAXIS: {DVT XBJ:47829}  FUNCTIONAL STATUS PRIOR TO HOSPITALIZATION: {Functional Evaluation:57568}  Ambulatory status/function: {BSHSI IP PUL REHAB AMBULATION FAOZ:30865}   EARLY  MOBILITY ASSESSMENT: {mobeval:57569}  ANTICIPATED DISCHARGE: {DISPO HQIO:96295}  ANTICIPATED DISPOSITION: {jdtdispo:58830}  EMERGENCY CONTACT/SURROGATE DECISION MAKER: ***  CRITICAL CARE WAS PERFORMED FOR THIS ENCOUNTER: {CRITICAL ZOXW:96045}      Signed By: Kara Pacer, MD     October 05, 2022         Please note that this dictation may have been completed with [employer]ubin Milan, the computer voice recognition software.  Quite often unanticipated grammatical, syntax, homophones, and other interpretive errors are inadvertently transcribed by the computer software.  Please disregard these errors.  Please excuse any errors that have escaped final proofreading.

## 2022-10-05 NOTE — Care Coordination-Inpatient (Signed)
Care Transitions Note    Follow Up Call      Patient Current Location:  Home: [address]  [address]  [place] [address]    Care Transition Nurse contacted the patient, family, daughter with pt' verbal permission, nursing station at facility  by telephone. Verified name and DOB as identifiers.    Additional needs identified to be addressed with provider   Pt states that "I'm not doing so good."  Pt reports "my strength is gone and my heart is beating fast."  I asked pt is she had called nurse and she reports no.  Pt gave verbal permission to speak with daughter.  Pt's daughter reports that pt sees Dr. Dory Peru for cardiology and pt's daughter  reports wt has been trending up.  Pt's daughter provided telephone number to nursing station.  Pt's daughter states that she will go check on pt.  Called nursing station and Misty Stanley will call me back and is having person check on pt.                  Method of communication with provider: none.    Care Summary Note: see above.  Misty Stanley returned my call and reports pt's wt at 134.4 lbs today and 133.6 lbs on 10/04/2022.  Misty Stanley reports that pt is at an activity and not in her room.  Called pt's daughter to report wts and also to report that pt is at an activity.  Pt's daughter states that she is on her way to check on pt.  Provided pt's daughter with telephone number for [facility].  Pt's daughter reports that pt was seen by Dr. Dory Peru on 09/23/2022 and weighed 130 lbs.  We discussed that Dr. Dory Peru needs to be alerted to wt trending upwards as pt is near 5 lb limit.  Pt's daughter is agreeable to a call tomorrow to follow up.    Plan of care updates since last contact:  Reviewed symptoms with pt and permission to speak with her daughter.  Spoke with Misty Stanley at the nursing station at [facility].       Advance Care Planning:   Does patient have an Advance Directive: deferred at this time, will discuss on future follow up. .    Medication Review:  Did not review.  Pt's daughter reports  facility administered medications    Remote Patient Monitoring:  Offered patient enrollment in the Remote Patient Monitoring (RPM) program for in-home monitoring: Patient is not eligible for RPM program because: affiliate provider.    Assessments:  Care Transitions 24 Hour Call    Do you have any ongoing symptoms?: Yes  Patient-reported symptoms: Weakness, Other (Comment: pt reports fast heart rate)  Interventions for patient-reported symptoms: Other  Do you have support at home?: Alone  Care Transitions Interventions         Goals Addressed                   This Visit's Progress     Conditions and Symptoms        I will schedule office visits, as directed by my provider.  I will keep my appointment or reschedule if I have to cancel.  I will notify my provider of any barriers to my plan of care.  I will follow my Zone Management tool to seek urgent or emergent care.  I will notify my provider of any symptoms that indicate a worsening of my condition.    Barriers: lack of  education and lives in a ILF environment: all meals from dining room, does not have working scale and BP cuff  Plan for overcoming my barriers: asked her to consider getting scale and BP cuff to monitor values in apartment.   Confidence: 7/10  Anticipated Goal Completion Date: 819/24    10/05/2022  Pt's daughter reports that pt see Dr. Dory Peru for cardiology  Called nurses station and someone is going to check on the pt.                 Follow Up Appointment:   Will discuss tomorrow      Care Transition Nurse provided contact information.   Will call on 10/06/2022  based on severity of symptoms and risk factors.  Plan for next call: symptom management-review symptoms, wts, cardiology follow up      Dorothea Ogle, RN

## 2022-10-06 ENCOUNTER — Inpatient Hospital Stay: Admit: 2022-10-06 | Payer: MEDICARE | Primary: Internal Medicine

## 2022-10-06 LAB — EKG 12-LEAD
Q-T Interval: 356 ms
QRS Duration: 130 ms
QTc Calculation (Bazett): 539 ms
R Axis: 53 degrees
T Axis: 164 degrees
Ventricular Rate: 138 {beats}/min

## 2022-10-06 LAB — TROPONIN
Troponin, High Sensitivity: 76 ng/L — ABNORMAL HIGH (ref 0–51)
Troponin, High Sensitivity: 71 ng/L — ABNORMAL HIGH (ref 0–51)
Troponin, High Sensitivity: 65 ng/L — ABNORMAL HIGH (ref 0–51)

## 2022-10-06 LAB — EXTRA TUBES HOLD

## 2022-10-06 LAB — TSH: TSH, 3rd Generation: 1.75 u[IU]/mL (ref 0.36–3.74)

## 2022-10-06 LAB — PROTIME-INR
INR: 1 (ref 0.9–1.1)
Protime: 10.7 s (ref 9.0–11.1)

## 2022-10-06 LAB — T4, FREE: T4 Free: 1.4 NG/DL (ref 0.8–1.5)

## 2022-10-06 MED ORDER — PANTOPRAZOLE SODIUM 40 MG PO TBEC
40 MG | Freq: Every day | ORAL | Status: AC
Start: 2022-10-06 — End: 2022-10-11
  Administered 2022-10-06 – 2022-10-11 (×6): 40 mg via ORAL

## 2022-10-06 MED ORDER — METOPROLOL TARTRATE 5 MG/5ML IV SOLN
55 MG/ML | Freq: Four times a day (QID) | INTRAVENOUS | Status: DC | PRN
Start: 2022-10-06 — End: 2022-10-11
  Administered 2022-10-06 – 2022-10-07 (×3): 5 mg via INTRAVENOUS

## 2022-10-06 MED ORDER — DIGOXIN 0.25 MG/ML IJ SOLN
0.25 | Freq: Once | INTRAMUSCULAR | Status: AC
Start: 2022-10-06 — End: 2022-10-06
  Administered 2022-10-06: 05:00:00 250 ug via INTRAVENOUS

## 2022-10-06 MED ORDER — ASPIRIN 81 MG PO CHEW
81 MG | Freq: Every day | ORAL | Status: AC
Start: 2022-10-06 — End: 2022-10-06
  Administered 2022-10-06: 13:00:00 81 mg via ORAL

## 2022-10-06 MED ORDER — NORMAL SALINE FLUSH 0.9 % IV SOLN
0.9 % | Freq: Two times a day (BID) | INTRAVENOUS | Status: AC
Start: 2022-10-06 — End: 2022-10-11
  Administered 2022-10-06 – 2022-10-11 (×11): 10 mL via INTRAVENOUS

## 2022-10-06 MED ORDER — MIDODRINE HCL 5 MG PO TABS
5 | Freq: Once | ORAL | Status: AC
Start: 2022-10-06 — End: 2022-10-06
  Administered 2022-10-06: 05:00:00 5 mg via ORAL

## 2022-10-06 MED ORDER — ONDANSETRON 4 MG PO TBDP
4 MG | Freq: Three times a day (TID) | ORAL | Status: AC | PRN
Start: 2022-10-06 — End: 2022-10-11

## 2022-10-06 MED ORDER — ACETAMINOPHEN 325 MG PO TABS
325 | Freq: Four times a day (QID) | ORAL | Status: DC | PRN
Start: 2022-10-06 — End: 2022-10-11

## 2022-10-06 MED ORDER — METOPROLOL TARTRATE 25 MG PO TABS
25 | Freq: Three times a day (TID) | ORAL | Status: DC
Start: 2022-10-06 — End: 2022-10-08
  Administered 2022-10-06 – 2022-10-08 (×5): 12.5 mg via ORAL

## 2022-10-06 MED ORDER — MULTIPLE VITAMINS PO TABS
Freq: Every day | ORAL | Status: DC
Start: 2022-10-06 — End: 2022-10-11
  Administered 2022-10-06 – 2022-10-11 (×6): 1 via ORAL

## 2022-10-06 MED ORDER — FUROSEMIDE 10 MG/ML IJ SOLN
10 | Freq: Every day | INTRAMUSCULAR | Status: DC
Start: 2022-10-06 — End: 2022-10-08
  Administered 2022-10-06 – 2022-10-07 (×2): 20 mg via INTRAVENOUS

## 2022-10-06 MED ORDER — ASPIRIN 325 MG PO TABS
325 | ORAL | Status: AC
Start: 2022-10-06 — End: 2022-10-05
  Administered 2022-10-06: 02:00:00 325 mg via ORAL

## 2022-10-06 MED ORDER — ATORVASTATIN CALCIUM 10 MG PO TABS
10 MG | Freq: Every day | ORAL | Status: AC
Start: 2022-10-06 — End: 2022-10-11
  Administered 2022-10-06 – 2022-10-11 (×6): 10 mg via ORAL

## 2022-10-06 MED ORDER — ACETAMINOPHEN 650 MG RE SUPP
650 | Freq: Four times a day (QID) | RECTAL | Status: DC | PRN
Start: 2022-10-06 — End: 2022-10-11

## 2022-10-06 MED ORDER — FUROSEMIDE 10 MG/ML IJ SOLN
10 | Freq: Once | INTRAMUSCULAR | Status: DC
Start: 2022-10-06 — End: 2022-10-05

## 2022-10-06 MED ORDER — APIXABAN 2.5 MG PO TABS
2.5 MG | Freq: Two times a day (BID) | ORAL | Status: DC
Start: 2022-10-06 — End: 2022-10-11
  Administered 2022-10-07 – 2022-10-11 (×10): 2.5 mg via ORAL

## 2022-10-06 MED ORDER — FUROSEMIDE 10 MG/ML IJ SOLN
10 | Freq: Once | INTRAMUSCULAR | Status: AC
Start: 2022-10-06 — End: 2022-10-05
  Administered 2022-10-06: 01:00:00 20 mg via INTRAVENOUS

## 2022-10-06 MED ORDER — NORMAL SALINE FLUSH 0.9 % IV SOLN
0.9 % | INTRAVENOUS | Status: AC | PRN
Start: 2022-10-06 — End: 2022-10-11

## 2022-10-06 MED ORDER — PRESERVISION AREDS 2 PO CHEW
Freq: Two times a day (BID) | ORAL | Status: AC
Start: 2022-10-06 — End: 2022-10-06

## 2022-10-06 MED ORDER — SODIUM CHLORIDE 0.9 % IV SOLN
0.9 % | INTRAVENOUS | Status: AC | PRN
Start: 2022-10-06 — End: 2022-10-11

## 2022-10-06 MED ORDER — ONDANSETRON HCL 4 MG/2ML IJ SOLN
42 MG/2ML | Freq: Four times a day (QID) | INTRAMUSCULAR | Status: AC | PRN
Start: 2022-10-06 — End: 2022-10-11

## 2022-10-06 MED ORDER — SODIUM CHLORIDE 0.9 % IV BOLUS
0.9 | Freq: Once | INTRAVENOUS | Status: AC
Start: 2022-10-06 — End: 2022-10-05
  Administered 2022-10-06: 01:00:00 250 mL via INTRAVENOUS

## 2022-10-06 MED ORDER — POLYETHYLENE GLYCOL 3350 17 G PO PACK
17 g | Freq: Every day | ORAL | Status: AC | PRN
Start: 2022-10-06 — End: 2022-10-11
  Administered 2022-10-08 – 2022-10-09 (×2): 17 g via ORAL

## 2022-10-06 MED FILL — SODIUM CHLORIDE 0.9 % IV SOLN: 0.9 % | INTRAVENOUS | Qty: 250

## 2022-10-06 MED FILL — METOPROLOL TARTRATE 5 MG/5ML IV SOLN: 5 MG/ML | INTRAVENOUS | Qty: 5

## 2022-10-06 MED FILL — FUROSEMIDE 10 MG/ML IJ SOLN: 10 MG/ML | INTRAMUSCULAR | Qty: 4

## 2022-10-06 MED FILL — THERA PO TABS: ORAL | Qty: 1

## 2022-10-06 MED FILL — MIDODRINE HCL 5 MG PO TABS: 5 MG | ORAL | Qty: 1

## 2022-10-06 MED FILL — CHILDRENS ASPIRIN 81 MG PO CHEW: 81 MG | ORAL | Qty: 1

## 2022-10-06 MED FILL — ASPIRIN 325 MG PO TABS: 325 MG | ORAL | Qty: 1

## 2022-10-06 MED FILL — DIGOXIN 0.25 MG/ML IJ SOLN: 0.25 MG/ML | INTRAMUSCULAR | Qty: 2

## 2022-10-06 MED FILL — MONOJECT FLUSH SYRINGE 0.9 % IV SOLN: 0.9 % | INTRAVENOUS | Qty: 40

## 2022-10-06 MED FILL — METOPROLOL TARTRATE 25 MG PO TABS: 25 MG | ORAL | Qty: 1

## 2022-10-06 MED FILL — PANTOPRAZOLE SODIUM 40 MG PO TBEC: 40 MG | ORAL | Qty: 1

## 2022-10-06 MED FILL — ATORVASTATIN CALCIUM 10 MG PO TABS: 10 MG | ORAL | Qty: 1

## 2022-10-06 NOTE — Progress Notes (Signed)
Hospitalist Progress Note  Mertha Finders, MD  Answering service: [phone]        Date of Service:  10/06/2022  NAME:  Mary Woodward  DOB:  1927-09-03  MRN:  [id number]      Admission Summary:     Patient admitted with A-fib with RVR.    Interval history / Subjective:     Patient denies any chest pain or shortness of breath.     Assessment & Plan:     Atrial fibrillation with rapid ventricular response  -Initially started on diltiazem drip but later discontinued due to low blood pressure  -S/p digoxin x 1, limited rate control medication due to low blood pressure  -Rate is now improved, patient not on anticoagulation due to falls per records  -Cardiology to further evaluate    Congestive heart failure  -Acute on chronic HFpEF, NYHA class IV, recent echo with EF of 50 to 55%  -Patient received Lasix 20 mg IV in the ER  -Holding further diuresis due to low blood pressure  -Cardiology to further evaluate    Abnormal LFTs  -Likely congestive hepatopathy  -Monitor    Hypertension  -Likely induced by tachycardia  -Midodrine as needed    Dyslipidemia  -Continue statin    GERD  -Continue PPI    History of PE  -No longer on anticoagulation due to frequent falls     Code status: Full  Prophylaxis: SCDs  Care Plan discussed with: Patient and patient's daughter present at bedside.  Anticipated Disposition: 24 to 48 hours  Central Line:   None             Review of Systems:   Pertinent items are noted in HPI.         Vital Signs:    Last 24hrs VS reviewed since prior progress note. Most recent are:  Vitals:    10/06/22 0830   BP: (!) 94/59   Pulse: (!) 105   Resp: 15   Temp: 98 F (36.7 C)   SpO2: 98%         Intake/Output Summary (Last 24 hours) at 10/06/2022 0915  Last data filed at 10/06/2022 0100  Gross per 24 hour   Intake --   Output 700 ml   Net -700 ml        Physical Examination:     I had a face to face encounter with this patient  and independently examined them on 10/06/2022 as outlined below:          General : alert x 3, awake, no acute distress,   HEENT: PEERL, EOMI, moist mucus membrane, TM clear  Neck: supple, no JVD, no meningeal signs  Chest: Clear to auscultation bilaterally   CVS: S1 S2 heard, Capillary refill less than 2 seconds  Abd: soft/ non tender, non distended, BS physiological,   Ext: no clubbing, no cyanosis, no edema, brisk 2+ DP pulses  Neuro/Psych: pleasant mood and affect, CN 2-12 grossly intact, sensory grossly within normal limit, Strength 5/5 in all extremities, DTR 1+ x 4  Skin: warm            Data Review:    Review and/or order of clinical lab test    I have independently reviewed and interpreted patient's lab and all other diagnostic data    Notes reviewed from all clinical/nonclinical/nursing services involved in patient's clinical care. Care coordination discussions were held with appropriate clinical/nonclinical/ nursing providers based on care coordination needs.  Labs:     Recent Labs     10/05/22  1857   WBC 7.9   HGB 9.9*   HCT 30.9*   PLT 316     Recent Labs     10/05/22  1857   NA 136   K 4.1   CL 101   CO2 27   BUN 45*     Recent Labs     10/05/22  1857   ALT 105*   GLOB 3.7     Recent Labs     10/06/22  0422   INR 1.0      No results for input(s): "TIBC" in the last 72 hours.    Invalid input(s): "FE", "PSAT", "FERR"   No results found for: "RBCF"   No results for input(s): "PH", "PCO2", "PO2" in the last 72 hours.  No results for input(s): "CPK" in the last 72 hours.    Invalid input(s): "CPKMB", "CKNDX", "TROIQ"  No results found for: "CHOL", "CHLST", "CHOLV", "HDL", "HDLC", "LDL"  No results found for: "GLUCPOC"  @LABUA @      Medications Reviewed:     Current Facility-Administered Medications   Medication Dose Route Frequency    multivitamin 1 tablet  1 tablet Oral Daily    sodium chloride flush 0.9 % injection 5-40 mL  5-40 mL IntraVENous 2 times per day    sodium chloride flush 0.9 % injection  5-40 mL  5-40 mL IntraVENous PRN    0.9 % sodium chloride infusion   IntraVENous PRN    ondansetron (ZOFRAN-ODT) disintegrating tablet 4 mg  4 mg Oral Q8H PRN    Or    ondansetron (ZOFRAN) injection 4 mg  4 mg IntraVENous Q6H PRN    polyethylene glycol (GLYCOLAX) packet 17 g  17 g Oral Daily PRN    acetaminophen (TYLENOL) tablet 650 mg  650 mg Oral Q6H PRN    Or    acetaminophen (TYLENOL) suppository 650 mg  650 mg Rectal Q6H PRN    aspirin chewable tablet 81 mg  81 mg Oral Daily    atorvastatin (LIPITOR) tablet 10 mg  10 mg Oral Daily    pantoprazole (PROTONIX) tablet 40 mg  40 mg Oral QAM AC     Current Outpatient Medications   Medication Sig    empagliflozin (JARDIANCE) 10 MG tablet Take 1 tablet by mouth daily    furosemide (LASIX) 20 MG tablet Take 1 tablet by mouth daily    pantoprazole (PROTONIX) 40 MG tablet Take 1 tablet by mouth every morning (before breakfast)    aspirin 81 MG chewable tablet Take 1 tablet by mouth daily    lovastatin (MEVACOR) 10 MG tablet Take 1 tablet by mouth nightly    metoprolol succinate (TOPROL XL) 25 MG extended release tablet Take 1 tablet by mouth every evening    Multiple Vitamins-Minerals (PRESERVISION AREDS 2) CHEW Take 1 tablet by mouth in the morning and at bedtime     ______________________________________________________________________  EXPECTED LENGTH OF STAY: 5  ACTUAL LENGTH OF STAY:          1                 Mertha Finders, MD

## 2022-10-06 NOTE — Consults (Addendum)
[facility] Consultation    Date of Consult:  10/06/22  Date of Admission: 10/05/2022  Primary Cardiologist: Dr. 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 Chestine Spore, and with shared decision making we decided to start Eliquis 2.5 mg q12h  Acute respiratory failure with hypoxemia  Diuresis  Wean oxygen as tolerated  AoCKD  Monitor renal function, electrolytes with diuresis  Suspect cardiorenal syndrome  HL  Abnormal LFTs  Suspect congestive hepatopathy   HTN  Currently hypotensive      Thank you for the opportunity to participate in the care of Mary Woodward and please do not hesitate to contact us should you have any questions.    History of Present Illness:  Mary Woodward is a 89 y.o. female with the below listed medical history who was admitted with dyspnea and AF with RVR.     From notes:  "89 y.o. female with past medical history of paroxysmal atrial fibrillation, HFpEF, pulmonary edema, hypertension, hyperlipidemia, PE, osteoarthritis, osteoporosis presented to emergency department with chief complaint of shortness of breath, weight gain and mostly right ankle swelling.  Patient was previously hospitalized with similar complaints of shortness of breath from 09/17/2022 to 09/20/2022 with diagnosis of A-fib RVR, heart failure  preserved ejection fraction, acute hypoxic respiratory failure secondary to heart failure preserved ejection fraction, acute pulmonary edema, and A-fib with RVR.  During the hospitalization, patient was treated with BiPAP and Lasix IV.  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  Patient is now on anticoagulation due to fall risks.  Patient continues with aforementioned symptoms which remains constant, moderate to severe, without specific alleviating factors.  She denies chest pain.  On arrival in ED today, initial recorded vital signs temperature 97.6 F, BP 105/55, heart rate 157, respiratory rate 16, O2 saturation 99% room air.  12-lead EKG showed A-fib RVR, PVCs, nonspecific intraventricular block, prolonged QTc 539 ms, ST/T wave changes in the anterior and inferior lateral leads at 130 bpm.  Chest x-ray portable showed resolving pulmonary edema.  Abnormal labs included proBNP 12,324, high-sensitivity troponin 76, hemoglobin 9.9, BUN 45, creatinine 2.03, GFR 23, blood glucose 122, albumin 3.4, ALT 105, AST 63.  ED ordered diltiazem 15 mg IV bolus injection followed by diltiazem continuous IV titrated infusion, Lasix 20 mg IV, magnesium sulfate 2000 mg IV, 0.9% normal saline 250 mL IV fluid bolus x 1, and aspirin 325 mg p.o. x 1 dose.  Patient will is now seen for admission to the hospitalist service.  However prior to arrival at the bedside, nurse notes that patient is now hypotension with SBP in 80s.  Diltiazem IV drip was discontinued.  "    She feels improved today.  Denies LH  or CP.  Denies recent medication changes.    PMH as above.      Prior to Admission medications    Medication Sig Start Date End Date Taking? Authorizing Provider   empagliflozin (JARDIANCE) 10 MG tablet Take 1 tablet by mouth daily 09/21/22   Ronnie Derby, MD   furosemide (LASIX) 20 MG tablet Take 1 tablet by mouth daily 09/21/22   Ronnie Derby, MD   pantoprazole (PROTONIX) 40 MG tablet Take 1 tablet by mouth every morning (before breakfast) 09/21/22   Ronnie Derby, MD   aspirin 81 MG chewable tablet Take 1 tablet by mouth daily    [provider]   lovastatin (MEVACOR) 10 MG tablet Take 1 tablet by mouth nightly    [provider]   metoprolol succinate (TOPROL XL) 25 MG extended release tablet Take 1 tablet by mouth every evening    [provider]   Multiple Vitamins-Minerals (PRESERVISION AREDS 2) CHEW Take 1 tablet by mouth in the morning and at bedtime    [provider]       Current Facility-Administered Medications   Medication Dose Route Frequency    multivitamin 1 tablet  1 tablet Oral Daily    sodium chloride flush 0.9 % injection 5-40 mL  5-40 mL IntraVENous 2 times per day    sodium chloride flush 0.9 % injection 5-40 mL  5-40 mL IntraVENous PRN    0.9 % sodium chloride infusion   IntraVENous PRN    ondansetron (ZOFRAN-ODT) disintegrating tablet 4 mg  4 mg Oral Q8H PRN    Or    ondansetron (ZOFRAN) injection 4 mg  4 mg IntraVENous Q6H PRN    polyethylene glycol (GLYCOLAX) packet 17 g  17 g Oral Daily PRN    acetaminophen (TYLENOL) tablet 650 mg  650 mg Oral Q6H PRN    Or    acetaminophen (TYLENOL) suppository 650 mg  650 mg Rectal Q6H PRN    aspirin chewable tablet 81 mg  81 mg Oral Daily    atorvastatin (LIPITOR) tablet 10 mg  10 mg Oral Daily    pantoprazole (PROTONIX) tablet 40 mg  40 mg Oral QAM AC     Current Outpatient Medications   Medication Sig    empagliflozin (JARDIANCE) 10 MG tablet Take 1 tablet by mouth daily    furosemide (LASIX) 20 MG tablet Take 1 tablet by mouth daily    pantoprazole (PROTONIX) 40 MG tablet Take 1 tablet by mouth every morning (before breakfast)    aspirin 81 MG chewable tablet Take 1 tablet by mouth daily    lovastatin (MEVACOR) 10 MG tablet Take 1 tablet by mouth nightly    metoprolol succinate (TOPROL XL) 25 MG extended release tablet Take 1 tablet by mouth  every evening    Multiple Vitamins-Minerals (PRESERVISION AREDS 2) CHEW Take 1 tablet by mouth in the morning and at bedtime       No pertinent CV family history.     Social History     Socioeconomic History    Marital status: Widowed     Spouse name: Not on file    Number of children: Not on file    Years of education: Not on file    Highest education level: Not on file   Occupational History    Not on file   Tobacco Use    Smoking status: Not on file    Smokeless tobacco: Not on file   Substance and Sexual Activity  Alcohol use: Not on file    Drug use: Not on file    Sexual activity: Not on file   Other Topics Concern    Not on file   Social History Narrative    Not on file     Social Determinants of Health     Financial Resource Strain: Not on file   Food Insecurity: Not on file   Transportation Needs: Not on file   Physical Activity: Not on file   Stress: Not on file   Social Connections: Not on file   Intimate Partner Violence: Not on file   Housing Stability: Not on file       Review of Systems   All other systems reviewed and are negative.        Temp (24hrs), Avg:97.8 F (36.6 C), Min:97.6 F (36.4 C), Max:98 F (36.7 C)    Patient Vitals for the past 8 hrs:   Pulse   10/06/22 0830 (!) 105   10/06/22 0500 (!) 104    Patient Vitals for the past 8 hrs:   Resp   10/06/22 0830 15   10/06/22 0500 16    Patient Vitals for the past 8 hrs:   BP   10/06/22 0830 (!) 94/59   10/06/22 0500 90/68          Intake/Output Summary (Last 24 hours) at 10/06/2022 1211  Last data filed at 10/06/2022 0830  Gross per 24 hour   Intake --   Output 1200 ml   Net -1200 ml       Physical Exam  GEN: NAD, appears stated age  HEENT: EOMI   NECK: Normal JVP  CV: Irregularly irregular, tachycardic, normal S1 and S2, no M/R/G  LUNGS: Bibasilar inspiratory crackles  ABD: Soft, ND  EXT: No edema, 2+ and symmetrical radial pulses b/l  PSYCH: Mood and affect normal  NEURO: Alert, MAEW, face symmetrical, speech intact    Lab Review:  No results  for input(s): "PH", "PCO2", "PO2" in the last 72 hours.  No results for input(s): "CPK", "CKMB" in the last 72 hours.    Invalid input(s): "CKNDX", "TROIQ"  Recent Labs     10/05/22  1857   NA 136   K 4.1   CL 101   CO2 27   BUN 45*   WBC 7.9   HGB 9.9*   HCT 30.9*   PLT 316     Recent Labs     10/05/22  1857   GLOB 3.7     Recent Labs     10/06/22  0422   INR 1.0      No results for input(s): "TIBC" in the last 72 hours.    Invalid input(s): "FE", "PSAT", "FERR"   No results found for: "GLUCPOC"    No results found for: "CHOL", "CHOLPOCT", "CHLST", "CHOLV", "HDL", "HDLPOC", "HDLC", "LDL", "VLDLC", "VLDL"     Recent Results (from the past 24 hour(s))   EKG 12 Lead    Collection Time: 10/05/22  6:43 PM   Result Value Ref Range    Ventricular Rate 138 BPM    QRS Duration 130 ms    Q-T Interval 356 ms    QTc Calculation (Bazett) 539 ms    R Axis 53 degrees    T Axis 164 degrees    Diagnosis       Atrial fibrillation with rapid ventricular response with premature   ventricular or aberrantly conducted complexes  Nonspecific intraventricular block  Poor  R-wave Progression (consider lead placement or loss of anterior forces)  T wave abnormality, consider inferolateral ischemia  Abnormal ECG  When compared with ECG of 17-Sep-2022 04:38,  Atrial fibrillation has replaced Sinus rhythm  T wave inversion now evident in Inferior leads  Confirmed by Tommy Medal [phone]) on 10/06/2022 12:39:08 AM     CBC with Auto Differential    Collection Time: 10/05/22  6:57 PM   Result Value Ref Range    WBC 7.9 3.6 - 11.0 K/uL    RBC 3.84 3.80 - 5.20 M/uL    Hemoglobin 9.9 (L) 11.5 - 16.0 g/dL    Hematocrit 60.4 (L) 35.0 - 47.0 %    MCV 80.5 80.0 - 99.0 FL    MCH 25.8 (L) 26.0 - 34.0 PG    MCHC 32.0 30.0 - 36.5 g/dL    RDW 54.0 (H) 98.1 - 14.5 %    Platelets 316 150 - 400 K/uL    MPV 9.6 8.9 - 12.9 FL    Nucleated RBCs 0.0 0 PER 100 WBC    nRBC 0.00 0.00 - 0.01 K/uL    Neutrophils % 75 32 - 75 %    Lymphocytes % 16 12 - 49 %    Monocytes % 9  5 - 13 %    Eosinophils % 0 0 - 7 %    Basophils % 0 0 - 1 %    Immature Granulocytes % 0 0.0 - 0.5 %    Neutrophils Absolute 5.9 1.8 - 8.0 K/UL    Lymphocytes Absolute 1.2 0.8 - 3.5 K/UL    Monocytes Absolute 0.7 0.0 - 1.0 K/UL    Eosinophils Absolute 0.0 0.0 - 0.4 K/UL    Basophils Absolute 0.0 0.0 - 0.1 K/UL    Immature Granulocytes Absolute 0.0 0.00 - 0.04 K/UL    Differential Type AUTOMATED     Comprehensive Metabolic Panel    Collection Time: 10/05/22  6:57 PM   Result Value Ref Range    Sodium 136 136 - 145 mmol/L    Potassium 4.1 3.5 - 5.1 mmol/L    Chloride 101 97 - 108 mmol/L    CO2 27 21 - 32 mmol/L    Anion Gap 8 5 - 15 mmol/L    Glucose 122 (H) 65 - 100 mg/dL    BUN 45 (H) 6 - 20 MG/DL    Creatinine 1.91 (H) 0.55 - 1.02 MG/DL    BUN/Creatinine Ratio 22 (H) 12 - 20      Est, Glom Filt Rate 23 (L) >60 ml/min/1.8m2    Calcium 9.3 8.5 - 10.1 MG/DL    Total Bilirubin 0.5 0.2 - 1.0 MG/DL    ALT 478 (H) 12 - 78 U/L    AST 63 (H) 15 - 37 U/L    Alk Phosphatase 96 45 - 117 U/L    Total Protein 7.1 6.4 - 8.2 g/dL    Albumin 3.4 (L) 3.5 - 5.0 g/dL    Globulin 3.7 2.0 - 4.0 g/dL    Albumin/Globulin Ratio 0.9 (L) 1.1 - 2.2     Brain Natriuretic Peptide    Collection Time: 10/05/22  6:57 PM   Result Value Ref Range    NT Pro-BNP 12,324 (H) <450 PG/ML   Extra Tubes Hold    Collection Time: 10/05/22  6:57 PM   Result Value Ref Range    Specimen HOld 1RED, 1BLU     Comment:        Add-on orders  for these samples will be processed based on acceptable specimen integrity and analyte stability, which may vary by analyte.   Troponin    Collection Time: 10/05/22  6:57 PM   Result Value Ref Range    Troponin, High Sensitivity 76 (H) 0 - 51 ng/L   TSH without Reflex    Collection Time: 10/05/22  6:57 PM   Result Value Ref Range    TSH, 3rd Generation 1.75 0.36 - 3.74 uIU/mL   T4, free    Collection Time: 10/05/22  6:57 PM   Result Value Ref Range    T4 Free 1.4 0.8 - 1.5 NG/DL   Protime-INR    Collection Time: 10/06/22  4:22  AM   Result Value Ref Range    INR 1.0 0.9 - 1.1      Protime 10.7 9.0 - 11.1 sec   Troponin    Collection Time: 10/06/22  4:22 AM   Result Value Ref Range    Troponin, High Sensitivity 71 (H) 0 - 51 ng/L   Extra Tubes Hold    Collection Time: 10/06/22  4:22 AM   Result Value Ref Range    Specimen HOld 1RED,1LAV     Comment:        Add-on orders for these samples will be processed based on acceptable specimen integrity and analyte stability, which may vary by analyte.   Troponin    Collection Time: 10/06/22  6:21 AM   Result Value Ref Range    Troponin, High Sensitivity 65 (H) 0 - 51 ng/L       Data Review:  ECG tracing personally reviewed:   Encounter Date: 10/05/22   EKG 12 Lead   Result Value    Ventricular Rate 138    QRS Duration 130    Q-T Interval 356    QTc Calculation (Bazett) 539    R Axis 53    T Axis 164    Diagnosis      Atrial fibrillation with rapid ventricular response with premature   ventricular or aberrantly conducted complexes  Nonspecific intraventricular block  Poor R-wave Progression (consider lead placement or loss of anterior forces)  T wave abnormality, consider inferolateral ischemia  Abnormal ECG  When compared with ECG of 17-Sep-2022 04:38,  Atrial fibrillation has replaced Sinus rhythm  T wave inversion now evident in Inferior leads  Confirmed by Tommy Medal [phone]) on 10/06/2022 12:39:08 AM         Echocardiogram:  09/17/22    ECHO (TTE) COMPLETE (PRN CONTRAST/BUBBLE/STRAIN/3D) 09/19/2022  5:44 PM (Final)    Interpretation Summary    Left Ventricle: Reduced left ventricular systolic function with a visually estimated EF of 50 - 55%. Left ventricle size is normal. Increased wall thickness. Diastolic dysfunction present with normal LV EF.    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    Left Atrium: Left atrium is dilated.    Image quality is adequate.    Signed by: Catalina Pizza, MD on 09/19/2022  5:44 PM    Other imaging:   CXR:  IMPRESSION:  Resolving  pulmonary edema.    Kermit Balo. Dory Peru, MD  Structural Interventional Cardiology  [facility]  10/06/22

## 2022-10-06 NOTE — Plan of Care (Signed)
Problem: Safety - Adult  Goal: Free from fall injury  10/06/2022 2153 by Katheran Awe, RN  Outcome: Progressing  10/06/2022 1354 by Louretta Shorten, RN  Outcome: Progressing     Problem: Discharge Planning  Goal: Discharge to home or other facility with appropriate resources  Outcome: Progressing     Problem: ABCDS Injury Assessment  Goal: Absence of physical injury  Outcome: Progressing     Problem: Pain  Goal: Verbalizes/displays adequate comfort level or baseline comfort level  Outcome: Progressing  Flowsheets (Taken 10/06/2022 2030)  Verbalizes/displays adequate comfort level or baseline comfort level: Encourage patient to monitor pain and request assistance

## 2022-10-06 NOTE — ED Notes (Signed)
SBAR given to [employer]

## 2022-10-06 NOTE — Progress Notes (Addendum)
1930  Verbal bedside report given to K. Hulser, RN Cabin crew by Judie Petit. Kylan Liberati, Teaching laboratory technician.  Report included current pt status and condition, recent results, hx of present illness, heart rate and rhythm (A-fib), and respiratory status.       1630  Pt's daughter will be out of town tomorrow, Loura Halt, Lillia Abed, will be first contact for that day.    1620  Orders received.    1540  Contacted cardiology regarding elevated heart rate.    1516  Pt sustaining elevate HR, messaged attending, attending requested cardiology be alerted.    1330  Patient arrived on CVSU via stretcher. Patient oriented to room and call bell, vital signs obtained. Opportunity for questions and clarification was provided. Assessment completed, rate and rhythm documented.    1246  Verbal report received by M. Danile Trier, RN from [employer], Charity fundraiser  on pt incoming from ED for progression of care.  Report consisted of SBAR, Kardex, ED Summary and Cardiac Rhythm.

## 2022-10-06 NOTE — Plan of Care (Signed)
Problem: Safety - Adult  Goal: Free from fall injury  Outcome: Progressing

## 2022-10-06 NOTE — Progress Notes (Signed)
4 Eyes Skin Assessment     NAME:  Mary Woodward  DATE OF BIRTH:  [birth year]  MEDICAL RECORD NUMBER:  [id number]    The patient is being assessed for  Admission    I agree that at least one RN has performed a thorough Head to Toe Skin Assessment on the patient. ALL assessment sites listed below have been assessed.      Areas assessed by both nurses:    Head, Face, Ears, Shoulders, Back, Chest, Arms, Elbows, Hands, Sacrum. Buttock, Coccyx, Ischium, and Legs. Feet and Heels        Does the Patient have a Wound? No noted wound(s)       Braden Prevention initiated by RN: Yes  Wound Care Orders initiated by RN: No    Pressure Injury (Stage 3,4, Unstageable, DTI, NWPT, and Complex wounds) if present, place Wound referral order by RN under ORDER ENTRY: No    New Ostomies, if present place, Ostomy referral order under ORDER ENTRY: No     Nurse 1 eSignature: Electronically signed by Louretta Shorten, RN on 10/06/22 at 3:52 PM EDT    **SHARE this note so that the co-signing nurse can place an eSignature**    Nurse 2 eSignature: Electronically signed by Harriet Masson, RN on 10/06/22 at 3:53 PM EDT

## 2022-10-06 NOTE — Discharge Instructions (Signed)
To access the American Heart Association's Interactive Workbook "Healthier Living with Heart Failure - Managing Symptoms and Reducing Risk"  Scan the QR code below.

## 2022-10-06 NOTE — Other (Signed)
HEART FAILURE NURSE NAVIGATOR NOTE  [facility]    Patient chart was reviewed by Heart Failure (HF) Nurse Navigators for compliance of prescribed treatment with guidelines directed medical therapy (GDMT) and HF database completed.     Please, review beneath recommendations for symptomatic patients with HF with Preserved Ejection Fraction ? 50% (HFpEF) for your consideration when taking care of this patient.    Current HF Medical Therapy:      Name Pa Mary Woodward   DOB [birth year]   LVEF 50/55   NYHA Functional Class IV   ARNi/ACEi/ARB Held d/t elevated creatinine   Aldosterone Antagonist    SGLT2 inhibitor Held d/t elevated creatinine (GFR 23)   Consulting Cardiologist Dr. Dory Peru ([facility])     Recommendations for HF Management:    For patients with HFpEF ? 50%, consider adding the following GDMT, if appropriate:  SGLT2 inhibitor [Class 2a]  ARNi or ARB [Class 2b]  Aldosterone antagonist [Class 2b]  Adjust antihypertensive therapy [Class 1]  Adjust diuretic dose at discharge if hospitalized for volume overload [Class 1]  For patient with hyperkalemia while on RAASi > 5.5, consider adding potassium binders (patiromer, sodium zirconium cycosilicate) [Class 2a]    Patients with suspected cardiac amyloidosis (older > 31 years old with LVH > 1.2cm and/or any other signs of amyloidosis) should be offered screening labs and imaging [Class 1]: (a) serum gammopathy profile and UPEP with immunofixation, and (b) PYP test. If PYP test is positive patient should have genetic testing done for inherited ATTR amyloidosis. If any findings are positive or you need genetic testing ordered, please, consider in-patient consultation or referral to [facility].      Note that the following medications may be potentially harmful in heart failure [Class 3].  Calcium channel blockers (doxazosin, diltiazem, verapamil, nifedipine)  Antiarrhythmics (flecanide, disopyrimide, sotalol,  dronedarone)  Diabetes medications (thiasolidinediones, saxagliptin, alogliptin)  NSAIDs and COX 2 inhibitors    Consider vaccinations for respiratory illnesses (flu, pneumonia, covid) [Class 2b]    Due to h/o recurrent hospitalizations this patient may benefit from referral to Advanced Heart Failure Program to assess suitability for advanced therapies, such as left-ventricular assist device, heart transplantation, palliative inotropes or palliation [Class 1]. To obtain in-patient consultation or refer to [facility], call [phone]    Patient Heart Failure Education:     Heart failure education to be provided including information about medical therapy, lifestyle modifications, diet and fluid restrictions, physical activity.  Educational resources to be provided: "AHA Discharge packet for patients diagnosed with Heart Failure booklet; Signs/Symptoms magnet; Weight Calendar; [facility] flyer.  Information to be provided about HF support group.  Learning about self-care for Heart Failure on discharge instructions.      Plan for Transitional Care:    Post discharge follow up phone call to be made within 48-72 hours of discharge.  Patient will follow-up with PCP.  Patient will follow-up with Primary Cardiologist.  Patient screened for obstructive sleep apnea and referred to Sleep Medicine.  Referral/follow-up with Cardiac Rehabilitation.  Referral/follow-up with Advanced Heart Failure Specialist.  Referral/follow-up with Palliative Care Specialist.        Heart Failure Nurse Navigator  Phone: [phone] / [phone]    *Recommendations listed above are based on the guidelines: 2022 AHA/ACC/HFSA Guideline for the Management of Heart Failure: A Report of the [employer] of Cardiology/American Heart Association Joint Committee on Clinical Practice Guidelines. Circulation 2022; [id number]. and 2017 AHA/ACC/HRS  guideline for management of patients with ventricular arrhythmias and  the prevention of sudden cardiac death: A Report of the [employer] of Cardiology/American Heart Association Task Force on Clinical Practice Guidelines and the Heart Rhythm Society. Heart Rhythm, Vol 15, No 10, October 2018 *Class of Recommendation: Class 1 (strong), Class 2a (moderate), Class 2b (weak), Class 3 (not recommended, potentially harmful)

## 2022-10-07 ENCOUNTER — Ambulatory Visit: Admit: 2022-10-07 | Payer: MEDICARE | Attending: Internal Medicine | Primary: Internal Medicine

## 2022-10-07 LAB — TEE W/ POSSIBLE CARDIOVERSION (PRN CONTRAST/BUBBLE/3D)
Ao Root Index: 1.96 cm/m2
Aortic Root: 3 cm
Ascending Aorta Index: 2.29 cm/m2
Ascending Aorta: 3.5 cm
Body Surface Area: 1.55 m2
Est. RA Pressure: 10 mmHg
RVSP: 30 mmHg
TR Max Velocity: 2.21 m/s
TR Peak Gradient: 19 mmHg

## 2022-10-07 LAB — CBC WITH AUTO DIFFERENTIAL
Basophils %: 0 % (ref 0–1)
Basophils Absolute: 0 10*3/uL (ref 0.0–0.1)
Eosinophils %: 2 % (ref 0–7)
Eosinophils Absolute: 0.2 10*3/uL (ref 0.0–0.4)
Hematocrit: 33.7 % — ABNORMAL LOW (ref 35.0–47.0)
Hemoglobin: 10.5 g/dL — ABNORMAL LOW (ref 11.5–16.0)
Immature Granulocytes %: 1 % — ABNORMAL HIGH (ref 0.0–0.5)
Immature Granulocytes Absolute: 0.1 10*3/uL — ABNORMAL HIGH (ref 0.00–0.04)
Lymphocytes %: 13 % (ref 12–49)
Lymphocytes Absolute: 1.2 10*3/uL (ref 0.8–3.5)
MCH: 25.4 PG — ABNORMAL LOW (ref 26.0–34.0)
MCHC: 31.2 g/dL (ref 30.0–36.5)
MCV: 81.4 FL (ref 80.0–99.0)
MPV: 9.8 FL (ref 8.9–12.9)
Monocytes %: 9 % (ref 5–13)
Monocytes Absolute: 0.8 10*3/uL (ref 0.0–1.0)
Neutrophils %: 75 % (ref 32–75)
Neutrophils Absolute: 7.2 10*3/uL (ref 1.8–8.0)
Nucleated RBCs: 0 PER 100 WBC
Platelets: 313 10*3/uL (ref 150–400)
RBC: 4.14 M/uL (ref 3.80–5.20)
RDW: 17.1 % — ABNORMAL HIGH (ref 11.5–14.5)
WBC: 9.5 10*3/uL (ref 3.6–11.0)
nRBC: 0 10*3/uL (ref 0.00–0.01)

## 2022-10-07 LAB — EKG 12-LEAD
Atrial Rate: 76 {beats}/min
P Axis: 78 degrees
P-R Interval: 184 ms
Q-T Interval: 464 ms
QRS Duration: 152 ms
QTc Calculation (Bazett): 522 ms
R Axis: 38 degrees
T Axis: 98 degrees
Ventricular Rate: 76 {beats}/min

## 2022-10-07 LAB — COMPREHENSIVE METABOLIC PANEL
ALT: 71 U/L (ref 12–78)
AST: 32 U/L (ref 15–37)
Albumin/Globulin Ratio: 0.8 — ABNORMAL LOW (ref 1.1–2.2)
Albumin: 3 g/dL — ABNORMAL LOW (ref 3.5–5.0)
Alk Phosphatase: 87 U/L (ref 45–117)
Anion Gap: 6 mmol/L (ref 5–15)
BUN/Creatinine Ratio: 24 — ABNORMAL HIGH (ref 12–20)
BUN: 46 MG/DL — ABNORMAL HIGH (ref 6–20)
CO2: 27 mmol/L (ref 21–32)
Calcium: 8.8 MG/DL (ref 8.5–10.1)
Chloride: 103 mmol/L (ref 97–108)
Creatinine: 1.94 MG/DL — ABNORMAL HIGH (ref 0.55–1.02)
Est, Glom Filt Rate: 24 mL/min/{1.73_m2} — ABNORMAL LOW (ref >60)
Globulin: 3.6 g/dL (ref 2.0–4.0)
Glucose: 123 mg/dL — ABNORMAL HIGH (ref 65–100)
Potassium: 3.8 mmol/L (ref 3.5–5.1)
Sodium: 136 mmol/L (ref 136–145)
Total Bilirubin: 0.4 MG/DL (ref 0.2–1.0)
Total Protein: 6.6 g/dL (ref 6.4–8.2)

## 2022-10-07 MED ORDER — DEXTROSE 5 % IV SOLN
5 % | Freq: Once | INTRAVENOUS | Status: AC
Start: 2022-10-07 — End: 2022-10-07
  Administered 2022-10-07: 15:00:00 150 mg via INTRAVENOUS

## 2022-10-07 MED ORDER — PHENYLEPHRINE HCL (PRESSORS) 0.4 MG/10ML IV SOSY
0.4 MG/10ML | INTRAVENOUS | Status: DC | PRN
  Administered 2022-10-07: 14:00:00 80 via INTRAVENOUS

## 2022-10-07 MED ORDER — ONDANSETRON HCL 4 MG/2ML IJ SOLN: 4 MG/2ML | INTRAMUSCULAR | Status: AC

## 2022-10-07 MED ORDER — PROPOFOL 100 MG/10ML IV EMUL
100 MG/10ML | INTRAVENOUS | Status: DC | PRN
  Administered 2022-10-07: 14:00:00 80 via INTRAVENOUS
  Administered 2022-10-07: 14:00:00 30 via INTRAVENOUS

## 2022-10-07 MED ORDER — AMIODARONE HCL 200 MG PO TABS
200 MG | Freq: Two times a day (BID) | ORAL | Status: DC
Start: 2022-10-07 — End: 2022-10-11
  Administered 2022-10-07 – 2022-10-11 (×9): 200 mg via ORAL

## 2022-10-07 MED ORDER — ONDANSETRON HCL 4 MG/2ML IJ SOLN
4 MG/2ML | INTRAMUSCULAR | Status: DC | PRN
  Administered 2022-10-07: 14:00:00 4 via INTRAVENOUS

## 2022-10-07 MED ORDER — DEXTROSE 5 % IV SOLN
5 % | INTRAVENOUS | Status: AC
Start: 2022-10-07 — End: 2022-10-07
  Administered 2022-10-07: 16:00:00 1 mg/min via INTRAVENOUS

## 2022-10-07 MED ORDER — DEXTROSE 5 % IV SOLN
5 | INTRAVENOUS | Status: DC
Start: 2022-10-07 — End: 2022-10-08
  Administered 2022-10-07 – 2022-10-08 (×2): 0.5 mg/min via INTRAVENOUS

## 2022-10-07 MED ORDER — DIGOXIN 0.25 MG/ML IJ SOLN
0.25 | Freq: Once | INTRAMUSCULAR | Status: AC
Start: 2022-10-07 — End: 2022-10-06
  Administered 2022-10-07: 01:00:00 250 ug via INTRAVENOUS

## 2022-10-07 MED ORDER — LIDOCAINE HCL (PF) 2 % IJ SOLN
2 % | INTRAMUSCULAR | Status: DC | PRN
  Administered 2022-10-07: 14:00:00 60 via INTRAVENOUS

## 2022-10-07 MED ORDER — PHENYLEPHRINE HCL 10 MG/ML SOLN (MIXTURES ONLY)
10 mg/mL | INTRAVENOUS | Status: DC | PRN
  Administered 2022-10-07: 14:00:00 40 via INTRAVENOUS

## 2022-10-07 MED ORDER — SODIUM CHLORIDE 0.9 % IV SOLN
0.9 | INTRAVENOUS | Status: DC | PRN
Start: 2022-10-07 — End: 2022-10-07
  Administered 2022-10-07: 14:00:00 via INTRAVENOUS

## 2022-10-07 MED FILL — ATORVASTATIN CALCIUM 10 MG PO TABS: 10 MG | ORAL | Qty: 1

## 2022-10-07 MED FILL — FUROSEMIDE 10 MG/ML IJ SOLN: 10 MG/ML | INTRAMUSCULAR | Qty: 4

## 2022-10-07 MED FILL — METOPROLOL TARTRATE 25 MG PO TABS: 25 MG | ORAL | Qty: 1

## 2022-10-07 MED FILL — ELIQUIS 2.5 MG PO TABS: 2.5 MG | ORAL | Qty: 1

## 2022-10-07 MED FILL — DIGOXIN 0.25 MG/ML IJ SOLN: 0.25 MG/ML | INTRAMUSCULAR | Qty: 2

## 2022-10-07 MED FILL — THERA PO TABS: ORAL | Qty: 1

## 2022-10-07 MED FILL — AMIODARONE HCL 450 MG/9ML IV SOLN: 450 MG/9ML | INTRAVENOUS | Qty: 9

## 2022-10-07 MED FILL — AMIODARONE HCL 200 MG PO TABS: 200 MG | ORAL | Qty: 1

## 2022-10-07 MED FILL — AMIODARONE HCL 150 MG/3ML IV SOLN: 150 MG/3ML | INTRAVENOUS | Qty: 3

## 2022-10-07 MED FILL — ONDANSETRON HCL 4 MG/2ML IJ SOLN: 4 MG/2ML | INTRAMUSCULAR | Qty: 2

## 2022-10-07 MED FILL — METOPROLOL TARTRATE 5 MG/5ML IV SOLN: 5 MG/ML | INTRAVENOUS | Qty: 5

## 2022-10-07 MED FILL — PANTOPRAZOLE SODIUM 40 MG PO TBEC: 40 MG | ORAL | Qty: 1

## 2022-10-07 NOTE — Plan of Care (Signed)
Problem: Safety - Adult  Goal: Free from fall injury  10/07/2022 0927 by Arne [place]al[place], RN  Outcome: Progressing  10/06/2022 2153 by Katheran Awe, RN  Outcome: Progressing     Problem: Discharge Planning  Goal: Discharge to home or other facility with appropriate resources  10/07/2022 0927 by Arne [place], RN  Outcome: Progressing  10/06/2022 2153 by Katheran Awe, RN  Outcome: Progressing  Flowsheets (Taken 10/06/2022 2030)  Discharge to home or other facility with appropriate resources: Identify barriers to discharge with patient and caregiver     Problem: ABCDS Injury Assessment  Goal: Absence of physical injury  10/07/2022 0927 by Arne [place], RN  Outcome: Progressing  10/06/2022 2153 by Katheran Awe, RN  Outcome: Progressing     Problem: Pain  Goal: Verbalizes/displays adequate comfort level or baseline comfort level  10/07/2022 0927 by Arne [place], RN  Outcome: Progressing  10/06/2022 2153 by Katheran Awe, RN  Outcome: Progressing  Flowsheets (Taken 10/06/2022 2030)  Verbalizes/displays adequate comfort level or baseline comfort level: Encourage patient to monitor pain and request assistance

## 2022-10-07 NOTE — Plan of Care (Signed)
Problem: Safety - Adult  Goal: Free from fall injury  10/07/2022 2235 by Lucile Shutters, RN  Outcome: Progressing  10/07/2022 0927 by Arne [place], RN  Outcome: Progressing     Problem: ABCDS Injury Assessment  Goal: Absence of physical injury  10/07/2022 2235 by Lucile Shutters, RN  Outcome: Progressing  10/07/2022 0927 by Arne [place], RN  Outcome: Progressing     Problem: Pain  Goal: Verbalizes/displays adequate comfort level or baseline comfort level  10/07/2022 2235 by Lucile Shutters, RN  Outcome: Progressing  10/07/2022 0927 by Arne [place], RN  Outcome: Progressing

## 2022-10-07 NOTE — Care Coordination-Inpatient (Signed)
10/07/22 1505   Readmission Assessment   Number of Days since last admission? 8-30 days   Previous Disposition Outpatient Rehab   Who is being Carollee Sires Unable to Complete  (Chart audit)   What was the patient's/caregiver's perception as to why they think they needed to return back to the hospital? Other (Comment)  (Weight gain and rapid HR)   Did you visit your Primary Care Physician after you left the hospital, before you returned this time? No   Why weren't you able to visit your PCP? Did not have an appointment   Did you see a specialist, such as Cardiac, Pulmonary, Orthopedic Physician, etc. after you left the hospital? Yes   Who advised the patient to return to the hospital? Self-referral   Does the patient report anything that got in the way of taking their medications? No   In our efforts to provide the best possible care to you and others like you, can you think of anything that we could have done to help you after you left the hospital the first time, so that you might not have needed to return so soon? Teach back instructions regarding management of illness     Angelica Ran, MSW CCM  Care Management

## 2022-10-07 NOTE — Progress Notes (Addendum)
1600 - Assumed care of patient from [facility]. Patient resting quietly in bed, respirations even and unlabored. O2 @ 3.5L/NC continues for now post procedure. Amiodarone infusion now @ 0.5 mg/min, infusing without difficulty. Family at bedside. Call light within reach.    1840 - Patient resting quietly in bed, respirations even and unlabored. Family remains at the bedside. Amiodarone infusion continues @ 0.5 mg/min. Call light remains within reach.

## 2022-10-07 NOTE — Progress Notes (Signed)
TRANSFER - IN REPORT:    Verbal report received from Jon, RN(name) on Mary Woodward  being received from 463(unit) for ordered procedure      Report consisted of patient's Situation, Background, Assessment and   Recommendations(SBAR).     Information from the following report(s) Nurse Handoff Report, MAR, and Cardiac Rhythm atrial fibrillation  was reviewed with the receiving nurse.    Opportunity for questions and clarification was provided.      Assessment completed upon patient's arrival to unit and care assume

## 2022-10-07 NOTE — Progress Notes (Signed)
Physical Therapy  PT consult received and chart reviewed.  Patient currently OTF for procedure.  Will continue to follow.  Leverne Humbles, PT

## 2022-10-07 NOTE — Progress Notes (Signed)
Hospitalist Progress Note  Larey Dresser, MD  Answering service: [phone]        Date of Service:  10/07/2022  NAME:  Mary Woodward  DOB:  May 06, 1927  MRN:  [id number]      Admission Summary:     Patient admitted with A-fib with RVR.    Interval history / Subjective:   I saw patient this afternoon.  She is lying comfortably, no complaints.  Underwent TEE and cardioversion this afternoon.     Assessment & Plan:     Atrial fibrillation with rapid ventricular response  -IV amiodarone transition to p.o.  S/p TEE, no thrombus, underwent cardioversion.  Started on apixaban.    Acute on chronic HFrEF.  Echocardiogram today showed EF of 20-25%, mild MR, severely dilated LA of note, recent echocardiogram showed a normal EF of 50-55%.  Drop in EF likely due to tachyarrhythmia  -Continue IV Lasix with hold parameters.  -Cardiac GDMT per cardiology but hypotension and CKD, advanced age may be limiting.    Hypoxia due to CHF: Supplemental oxygen to keep SpO2 above 92%    Abnormal LFTs, resolved.  -Likely congestive hepatopathy      Hypotension  -Likely induced by tachycardia  -Midodrine as needed    Dyslipidemia  -Continue statin    GERD  -Continue PPI    History of PE  -No longer on anticoagulation due to frequent falls.  She is now started on apixaban for stroke prevention    CKD stage IV, bump in creatinine could be due to hypoperfusion due to tachyarrhythmia, hypotension.  Creatinine improving.  Monitor.  Avoid nephrotoxic agents.     Code status: Full  Prophylaxis: SCDs  Care Plan discussed with: Patient and RN.  Anticipated Disposition: 24 to 48 hours  Central Line:   None             Review of Systems:   Pertinent items are noted in HPI.         Vital Signs:    Last 24hrs VS reviewed since prior progress note. Most recent are:  Vitals:    10/07/22 1800   BP:    Pulse: 62   Resp:    Temp:    SpO2:          Intake/Output Summary  (Last 24 hours) at 10/07/2022 1830  Last data filed at 10/07/2022 1055  Gross per 24 hour   Intake 300 ml   Output 650 ml   Net -350 ml          Physical Examination:     I had a face to face encounter with this patient and independently examined them on 10/07/2022 as outlined below:          General : alert x 3, awake, no acute distress,   HEENT: PEERL, EOMI, moist mucus membrane, TM clear  Neck: supple, no JVD, no meningeal signs  Chest: On oxygen via nasal cannula 2 L SpO2 100%.  Clear to auscultation bilaterally   CVS: Tachycardic, irregularly irregular.    Abd: soft/ non tender, non distended, BS physiological,   Ext: No peripheral edema.  Neuro/Psych: pleasant mood and affect, CN 2-12 grossly intact, sensory grossly within normal limit, Strength 5/5 in all extremities, DTR 1+ x 4  Skin: warm            Data Review:    Review and/or order of clinical lab test    I have independently reviewed and interpreted patient's lab and  all other diagnostic data    Notes reviewed from all clinical/nonclinical/nursing services involved in patient's clinical care. Care coordination discussions were held with appropriate clinical/nonclinical/ nursing providers based on care coordination needs.     Labs:     Recent Labs     10/05/22  1857 10/07/22  1205   WBC 7.9 9.5   HGB 9.9* 10.5*   HCT 30.9* 33.7*   PLT 316 313       Recent Labs     10/05/22  1857 10/07/22  0311   NA 136 136   K 4.1 3.8   CL 101 103   CO2 27 27   BUN 45* 46*       Recent Labs     10/05/22  1857 10/07/22  0311   ALT 105* 71   GLOB 3.7 3.6       Recent Labs     10/06/22  0422   INR 1.0        No results for input(s): "TIBC" in the last 72 hours.    Invalid input(s): "FE", "PSAT", "FERR"   No results found for: "RBCF"   No results for input(s): "PH", "PCO2", "PO2" in the last 72 hours.  No results for input(s): "CPK" in the last 72 hours.    Invalid input(s): "CPKMB", "CKNDX", "TROIQ"  No results found for: "CHOL", "CHLST", "CHOLV", "HDL", "HDLC", "LDL"  No results  found for: "GLUCPOC"  @LABUA @      Medications Reviewed:     Current Facility-Administered Medications   Medication Dose Route Frequency    amiodarone (CORDARONE) tablet 200 mg  200 mg Oral BID    amiodarone (CORDARONE) 450 mg in dextrose 5 % 250 mL infusion (Vial2Bag)  0.5 mg/min IntraVENous Continuous    multivitamin 1 tablet  1 tablet Oral Daily    metoprolol tartrate (LOPRESSOR) tablet 12.5 mg  12.5 mg Oral TID    furosemide (LASIX) injection 20 mg  20 mg IntraVENous Daily    metoprolol (LOPRESSOR) injection 5 mg  5 mg IntraVENous Q6H PRN    apixaban (ELIQUIS) tablet 2.5 mg  2.5 mg Oral BID    sodium chloride flush 0.9 % injection 5-40 mL  5-40 mL IntraVENous 2 times per day    sodium chloride flush 0.9 % injection 5-40 mL  5-40 mL IntraVENous PRN    0.9 % sodium chloride infusion   IntraVENous PRN    ondansetron (ZOFRAN-ODT) disintegrating tablet 4 mg  4 mg Oral Q8H PRN    Or    ondansetron (ZOFRAN) injection 4 mg  4 mg IntraVENous Q6H PRN    polyethylene glycol (GLYCOLAX) packet 17 g  17 g Oral Daily PRN    acetaminophen (TYLENOL) tablet 650 mg  650 mg Oral Q6H PRN    Or    acetaminophen (TYLENOL) suppository 650 mg  650 mg Rectal Q6H PRN    atorvastatin (LIPITOR) tablet 10 mg  10 mg Oral Daily    pantoprazole (PROTONIX) tablet 40 mg  40 mg Oral QAM AC     ______________________________________________________________________  EXPECTED LENGTH OF STAY: 3  ACTUAL LENGTH OF STAY:          2                 Ave Scharnhorst, MD

## 2022-10-07 NOTE — Progress Notes (Signed)
TRANSFER - OUT REPORT:    Verbal report given to Jon, RN(name) on Mary Woodward being transferred to 463(unit) for routine progression of patient care       Report consisted of patient's Situation, Background, Assessment and   Recommendations(SBAR).     Information from the following report(s) Nurse Handoff Report, MAR, and Cardiac Rhythm sinus rhythm with PAC's  was reviewed with the receiving nurse.    Opportunity for questions and clarification was provided.      Patient transported with:   Monitor  O2 @ 2 liters  Registered Nurse

## 2022-10-07 NOTE — Progress Notes (Signed)
CARDIOLOGY NOTE    Status post TEE which did not demonstrate left atrial appendage thrombus with subsequent successful cardioversion using 1 synchronized biphasic 200 J shock.    ECG confirms sinus rhythm with sinus arrhythmia.    Will load with IV amiodarone with a bolus and drip for 24 hours.  Also will load with oral amiodarone using amiodarone 200 mg twice daily for a week followed by 200 mg once daily.    Monitor overnight on telemetry.    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  Hopefully with restoration of sinus rhythm and guideline directed medical therapy her EF will recover.    I updated her granddaughter, Dr. Chestine Spore, a pulmonologist in [place].  She will update the rest of her family.    Thank you for the opportunity to participate in the care of Mary Woodward and please do not hesitate to contact us should you have any questions.    Kermit Balo. Dory Peru, MD  Structural / Interventional Cardiology  [facility]  10/07/22

## 2022-10-07 NOTE — Progress Notes (Signed)
Occupational Therapy:  10/07/22    Order received and acknowledged. Pt currently OTF for procedure. Will defer for now and continue to follow.    Thank you,  Margot Ables OTD, OTR/L

## 2022-10-07 NOTE — Anesthesia Pre-Procedure Evaluation (Signed)
Department of Anesthesiology  Preprocedure Note       Name:  Mary Woodward   Age:  89 y.o.  DOB:  [birth year]                                          MRN:  [id number]         Date:  10/07/2022      Surgeon: * Surgery not found *    Procedure:     Medications prior to admission:   Prior to Admission medications    Medication Sig Start Date End Date Taking? Authorizing Provider   empagliflozin (JARDIANCE) 10 MG tablet Take 1 tablet by mouth daily 09/21/22   Ronnie Derby, MD   furosemide (LASIX) 20 MG tablet Take 1 tablet by mouth daily 09/21/22   Ronnie Derby, MD   pantoprazole (PROTONIX) 40 MG tablet Take 1 tablet by mouth every morning (before breakfast) 09/21/22   Ronnie Derby, MD   aspirin 81 MG chewable tablet Take 1 tablet by mouth daily    [provider]   lovastatin (MEVACOR) 10 MG tablet Take 1 tablet by mouth nightly    [provider]   metoprolol succinate (TOPROL XL) 25 MG extended release tablet Take 1 tablet by mouth every evening    [provider]   Multiple Vitamins-Minerals (PRESERVISION AREDS 2) CHEW Take 1 tablet by mouth in the morning and at bedtime    [provider]       Current medications:    Current Facility-Administered Medications   Medication Dose Route Frequency Provider Last Rate Last Admin   . multivitamin 1 tablet  1 tablet Oral Daily Zaw, Kyaw T, MD   1 tablet at 10/07/22 0819   . metoprolol tartrate (LOPRESSOR) tablet 12.5 mg  12.5 mg Oral TID Einar Grad, MD   12.5 mg at 10/07/22 0820   . furosemide (LASIX) injection 20 mg  20 mg IntraVENous Daily Einar Grad, MD   20 mg at 10/07/22 0820   . metoprolol (LOPRESSOR) injection 5 mg  5 mg IntraVENous Q6H PRN Einar Grad, MD   5 mg at 10/07/22 0705   . apixaban (ELIQUIS) tablet 2.5 mg  2.5 mg Oral BID Einar Grad, MD   2.5 mg at 10/07/22 0820   . sodium chloride flush 0.9 % injection 5-40 mL  5-40 mL IntraVENous 2 times per day Lucinda Dell, MD   10 mL at 10/06/22 2058   .  sodium chloride flush 0.9 % injection 5-40 mL  5-40 mL IntraVENous PRN Pegram, Melvin L, MD       . 0.9 % sodium chloride infusion   IntraVENous PRN Pegram, Amelia Jo, MD       . ondansetron (ZOFRAN-ODT) disintegrating tablet 4 mg  4 mg Oral Q8H PRN Pegram, Amelia Jo, MD        Or   . ondansetron (ZOFRAN) injection 4 mg  4 mg IntraVENous Q6H PRN Pegram, Amelia Jo, MD       . polyethylene glycol (GLYCOLAX) packet 17 g  17 g Oral Daily PRN Lucinda Dell, MD       . acetaminophen (TYLENOL) tablet 650 mg  650 mg Oral Q6H PRN Lucinda Dell, MD        Or   . acetaminophen (TYLENOL) suppository 650 mg  650 mg Rectal Q6H  PRN Lucinda Dell, MD       . atorvastatin (LIPITOR) tablet 10 mg  10 mg Oral Daily Lucinda Dell, MD   10 mg at 10/07/22 0819   . pantoprazole (PROTONIX) tablet 40 mg  40 mg Oral QAM AC Lucinda Dell, MD   40 mg at 10/07/22 0705       Allergies:  No Known Allergies    Problem List:    Patient Active Problem List   Diagnosis Code   . Acute hypoxic respiratory failure (HCC) J96.01   . Atrial fibrillation with rapid ventricular response (HCC) I48.91       Past Medical History:  No past medical history on file.    Past Surgical History:  No past surgical history on file.    Social History:    Social History     Tobacco Use   . Smoking status: Former     Types: Cigarettes     Passive exposure: Past (stopped about 60 years ago)   . Smokeless tobacco: Never   Substance Use Topics   . Alcohol use: Yes     Alcohol/week: 2.0 standard drinks of alcohol     Types: 2 Glasses of wine per week                                Counseling given: Not Answered      Vital Signs (Current):   Vitals:    10/07/22 0400 10/07/22 0500 10/07/22 0600 10/07/22 0800   BP:    118/88   Pulse: 100 (!) 112  (!) 124   Resp:    21   Temp:    98.3 F (36.8 C)   TempSrc:    Oral   SpO2:    95%   Weight:   57.1 kg (125 lb 14.1 oz)    Height:                                                  BP Readings from Last 3 Encounters:    10/07/22 118/88   09/20/22 (!) 142/66       NPO Status:                                                                                 BMI:   Wt Readings from Last 3 Encounters:   10/07/22 57.1 kg (125 lb 14.1 oz)   09/20/22 62 kg (136 lb 11.2 oz)     Body mass index is 24.58 kg/m.    CBC:   Lab Results   Component Value Date/Time    WBC 7.9 10/05/2022 06:57 PM    RBC 3.84 10/05/2022 06:57 PM    HGB 9.9 10/05/2022 06:57 PM    HCT 30.9 10/05/2022 06:57 PM    MCV 80.5 10/05/2022 06:57 PM    RDW 17.4 10/05/2022 06:57 PM    PLT 316 10/05/2022 06:57 PM       CMP:   Lab  Results   Component Value Date/Time    NA 136 10/07/2022 03:11 AM    K 3.8 10/07/2022 03:11 AM    CL 103 10/07/2022 03:11 AM    CO2 27 10/07/2022 03:11 AM    BUN 46 10/07/2022 03:11 AM    CREATININE 1.94 10/07/2022 03:11 AM    LABGLOM 24 10/07/2022 03:11 AM    GLUCOSE 123 10/07/2022 03:11 AM    CALCIUM 8.8 10/07/2022 03:11 AM    BILITOT 0.4 10/07/2022 03:11 AM    ALKPHOS 87 10/07/2022 03:11 AM    AST 32 10/07/2022 03:11 AM    ALT 71 10/07/2022 03:11 AM       POC Tests: No results for input(s): "POCGLU", "POCNA", "POCK", "POCCL", "POCBUN", "POCHEMO", "POCHCT" in the last 72 hours.    Coags:   Lab Results   Component Value Date/Time    PROTIME 10.7 10/06/2022 04:22 AM    INR 1.0 10/06/2022 04:22 AM       HCG (If Applicable): No results found for: "PREGTESTUR", "PREGSERUM", "HCG", "HCGQUANT"     ABGs: No results found for: "PHART", "PO2ART", "PCO2ART", "HCO3ART", "BEART", "O2SATART"     Type & Screen (If Applicable):  No results found for: "LABABO"    Drug/Infectious Status (If Applicable):  No results found for: "HIV", "HEPCAB"    COVID-19 Screening (If Applicable):   Lab Results   Component Value Date/Time    COVID19 Not detected 09/20/2022 11:55 AM    COVID19 Not detected 09/17/2022 06:05 AM           Anesthesia Evaluation  Patient summary reviewed and Nursing notes reviewed  Airway: Mallampati: II  TM distance: >3 FB   Neck ROM: full  Mouth opening: >  = 3 FB   Dental: normal exam         Pulmonary:Negative Pulmonary ROS breath sounds clear to auscultation                             Cardiovascular:  Exercise tolerance: good (>4 METS)  (+) dysrhythmias: atrial fibrillation        Rhythm: regular  Rate: normal                    Neuro/Psych:   Negative Neuro/Psych ROS              GI/Hepatic/Renal:   (+) GERD:          Endo/Other: Negative Endo/Other ROS                    Abdominal:             Vascular: negative vascular ROS.         Other Findings:         Anesthesia Plan      MAC     ASA 2       Induction: intravenous.      Anesthetic plan and risks discussed with patient.      Plan discussed with CRNA.    Attending anesthesiologist reviewed and agrees with Preprocedure content            Lillie Columbia, MD   10/07/2022

## 2022-10-07 NOTE — Care Coordination-Inpatient (Signed)
Care Management Initial Assessment       RUR:18%  Readmission? Yes - 5/17-5/20  1st IM letter given? Yes - 6/6  1st [employer] letter given: No    CM attempted to meet w patient but she was sleeping.  I called and spoke w her daughter, [name] briefly but she was on a boat and agreed to call me back tomorrow.  She declined for CM to call anyone else today and reported she is the primary contact.  CM completed the initial assessment via chart review but also spoke w the Child psychotherapist, Duwayne Heck [phone]) at [facility].  She reports patient lives in independent living, uses a rollator for mobility and participates in onsite therapy at the community.  Patient does pay extra for the facility to administer her medications.  CM will continue to follow for TOC needs.     Patient is able to return to [name] but they need a Rapid Covid test, CXR and therapy orders so they can resume care. Clinicals can be faxed to [phone].    10/07/22 1452   Service Assessment   Patient Orientation Unable to Assess   Cognition Alert   History Provided By Patient   Primary Caregiver Other (Comment)  ([name])   Support Systems Family Members;Children   Patient's Healthcare Decision Maker is: Legal Next of Kin   PCP Verified by CM Yes  Renato Battles, MD at [facility])   Last Visit to PCP Within last 3 months   Prior Functional Level Assistance with the following:;Mobility;Shopping;Housework;Cooking   Can patient return to prior living arrangement Yes   Ability to make needs known: Good   Family able to assist with home care needs: Yes   Would you like for me to discuss the discharge plan with any other family members/significant others, and if so, who? Yes  (Daughter, Cassie Freer [phone])   Financial Resources Medicare   Social/Functional History   Lives With Alone   Type of Home Senior housing apartment  ([facility])   Home Layout One level   Home Access Level entry;Elevator   Bathroom Shower/Tub  Walk-in Therapist, music  (Always uses rollator.)   ADL Assistance Independent   Occupational hygienist No   Patient's Driver Info daughter   Mode of Engineer, water   Occupation Retired   Building control surveyor   Type of Residence [facility]   Current Services Prior To Admission   ([name] provides PT/OT and medication administration)   Services At/After Discharge   Mode of Transport at Discharge Other (see comment)  (Family)   Condition of Participation: Discharge Planning   Freedom of Choice list was provided with basic dialogue that supports the patient's individualized plan of care/goals, treatment preferences, and shares the quality data associated with the providers?  No  (Patient is already open to services and wishes to continue with the same services.)     Angelica Ran, MSW CCM  Care Management

## 2022-10-07 NOTE — Anesthesia Post-Procedure Evaluation (Signed)
Department of Anesthesiology  Postprocedure Note    Patient: Mary Woodward  MRN: [id number]  Birthdate: 12-Sep-1927  Date of evaluation: 10/07/2022    Procedure Summary       Date: 10/07/22 Room / Location: [facility] NON-INVASIVE CARDIOLOGY    Anesthesia Start: [phone] Anesthesia Stop: 1009    Procedure: TEE W/ POSSIBLE CARDIOVERSION (PRN CONTRAST/BUBBLE/3D) Diagnosis: Persistent atrial fibrillation (HCC)    Scheduled Providers: Lillie Columbia, MD; Einar Grad, MD Responsible Provider: Jackolyn Confer I, DO    Anesthesia Type: MAC ASA Status: 2            Anesthesia Type: MAC    Aldrete Phase I: Aldrete Score: 10    Aldrete Phase II:      Anesthesia Post Evaluation    Patient location during evaluation: PACU  Level of consciousness: awake  Airway patency: patent  Nausea & Vomiting: no nausea  Cardiovascular status: hemodynamically stable  Respiratory status: acceptable  Hydration status: stable  Multimodal analgesia pain management approach  Pain management: adequate    No notable events documented.

## 2022-10-08 LAB — COMPREHENSIVE METABOLIC PANEL
ALT: 53 U/L (ref 12–78)
AST: 25 U/L (ref 15–37)
Albumin/Globulin Ratio: 0.9 — ABNORMAL LOW (ref 1.1–2.2)
Albumin: 2.9 g/dL — ABNORMAL LOW (ref 3.5–5.0)
Alk Phosphatase: 77 U/L (ref 45–117)
Anion Gap: 7 mmol/L (ref 5–15)
BUN/Creatinine Ratio: 23 — ABNORMAL HIGH (ref 12–20)
BUN: 42 MG/DL — ABNORMAL HIGH (ref 6–20)
CO2: 26 mmol/L (ref 21–32)
Calcium: 8.8 MG/DL (ref 8.5–10.1)
Chloride: 101 mmol/L (ref 97–108)
Creatinine: 1.86 MG/DL — ABNORMAL HIGH (ref 0.55–1.02)
Est, Glom Filt Rate: 25 mL/min/{1.73_m2} — ABNORMAL LOW (ref >60)
Globulin: 3.2 g/dL (ref 2.0–4.0)
Glucose: 133 mg/dL — ABNORMAL HIGH (ref 65–100)
Potassium: 3.8 mmol/L (ref 3.5–5.1)
Sodium: 134 mmol/L — ABNORMAL LOW (ref 136–145)
Total Bilirubin: 0.7 MG/DL (ref 0.2–1.0)
Total Protein: 6.1 g/dL — ABNORMAL LOW (ref 6.4–8.2)

## 2022-10-08 LAB — MAGNESIUM: Magnesium: 2.7 mg/dL — ABNORMAL HIGH (ref 1.6–2.4)

## 2022-10-08 MED ORDER — LOSARTAN POTASSIUM 25 MG PO TABS
25 MG | Freq: Every evening | ORAL | Status: DC
Start: 2022-10-08 — End: 2022-10-11
  Administered 2022-10-08 – 2022-10-10 (×3): 12.5 mg via ORAL

## 2022-10-08 MED ORDER — METOPROLOL SUCCINATE ER 25 MG PO TB24
25 MG | Freq: Every day | ORAL | Status: DC
Start: 2022-10-08 — End: 2022-10-11
  Administered 2022-10-09 – 2022-10-11 (×3): 12.5 mg via ORAL

## 2022-10-08 MED ORDER — ALBUMIN HUMAN 25 % IV SOLN
25 | Freq: Four times a day (QID) | INTRAVENOUS | Status: AC
Start: 2022-10-08 — End: 2022-10-08
  Administered 2022-10-08 (×2): 25 g via INTRAVENOUS

## 2022-10-08 MED ORDER — ALBUMIN HUMAN 25 % IV SOLN
25 | Freq: Four times a day (QID) | INTRAVENOUS | Status: DC
Start: 2022-10-08 — End: 2022-10-08

## 2022-10-08 MED ORDER — METOPROLOL SUCCINATE ER 25 MG PO TB24
25 | Freq: Every day | ORAL | Status: DC
Start: 2022-10-08 — End: 2022-10-08

## 2022-10-08 MED ORDER — FUROSEMIDE 10 MG/ML IJ SOLN
10 | Freq: Two times a day (BID) | INTRAMUSCULAR | Status: AC
Start: 2022-10-08 — End: 2022-10-08
  Administered 2022-10-08: 20:00:00 40 mg via INTRAVENOUS

## 2022-10-08 MED ORDER — POTASSIUM BICARB-CITRIC ACID 20 MEQ PO TBEF
20 | Freq: Once | ORAL | Status: AC
Start: 2022-10-08 — End: 2022-10-08
  Administered 2022-10-08: 14:00:00 40 meq via ORAL

## 2022-10-08 MED ORDER — FUROSEMIDE 40 MG PO TABS
40 MG | Freq: Every day | ORAL | Status: DC
Start: 2022-10-08 — End: 2022-10-11
  Administered 2022-10-09 – 2022-10-11 (×3): 40 mg via ORAL

## 2022-10-08 MED ORDER — STERILE WATER FOR INJECTION IJ SOLN
500 MG | Freq: Every day | INTRAMUSCULAR | Status: DC
Start: 2022-10-08 — End: 2022-10-08

## 2022-10-08 MED FILL — AMIODARONE HCL 200 MG PO TABS: 200 MG | ORAL | Qty: 1

## 2022-10-08 MED FILL — MIRALAX 17 G PO PACK: 17 g | ORAL | Qty: 1

## 2022-10-08 MED FILL — ALBUTEIN 25 % IV SOLN: 25 % | INTRAVENOUS | Qty: 100

## 2022-10-08 MED FILL — THERA PO TABS: ORAL | Qty: 1

## 2022-10-08 MED FILL — PANTOPRAZOLE SODIUM 40 MG PO TBEC: 40 MG | ORAL | Qty: 1

## 2022-10-08 MED FILL — METOPROLOL SUCCINATE ER 25 MG PO TB24: 25 MG | ORAL | Qty: 1

## 2022-10-08 MED FILL — FUROSEMIDE 10 MG/ML IJ SOLN: 10 MG/ML | INTRAMUSCULAR | Qty: 4

## 2022-10-08 MED FILL — AMIODARONE HCL 450 MG/9ML IV SOLN: 450 MG/9ML | INTRAVENOUS | Qty: 9

## 2022-10-08 MED FILL — ATORVASTATIN CALCIUM 10 MG PO TABS: 10 MG | ORAL | Qty: 1

## 2022-10-08 MED FILL — METOPROLOL TARTRATE 25 MG PO TABS: 25 MG | ORAL | Qty: 1

## 2022-10-08 MED FILL — ELIQUIS 2.5 MG PO TABS: 2.5 MG | ORAL | Qty: 1

## 2022-10-08 MED FILL — EFFER-K 20 MEQ PO TBEF: 20 MEQ | ORAL | Qty: 2

## 2022-10-08 MED FILL — LOSARTAN POTASSIUM 25 MG PO TABS: 25 MG | ORAL | Qty: 1

## 2022-10-08 NOTE — Progress Notes (Signed)
Referral source:   Anne Boltz at [facility] in [facility] 4 CV SERVICES UNIT. Chaplain attended rounds on the CV Services Unit as part of the Interdisciplinary team where the patient's ongoing care was discussed. I reviewed the medical record as part of this encounter.     Outcome: Interdisciplinary team are aware of chaplain availability and were encouraged to request Spiritual Health support as needed.      The chaplain on-call can be reached at [phone]).     Marilou Barnfield. Scotty Court, MDiv, [facility]  Staff Chaplain

## 2022-10-08 NOTE — Progress Notes (Addendum)
VCS Cardiology Progress Note    Usual Cardiologist: Dr. 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  Continue diuresis with furosemide 40 mg IV BID AC  Would discharge on furosemide 40 mg po once daily with a 2nd dose on days that her weight is 3 lbs above her baseline  Educated on importance of daily weights and avoiding dietary sodium (goal <2000 mg/day)  Strict Is and Os, daily weights  Will give 25% albumin 25 g IV q6h x 2 doses to help mobilize 3rd spaced fluid in setting of anemia and hypoalbuminemia  Metoprolol succinate 12.5 mg once daily  Losartan 12.5 mg once daily  May need midodrine 5 mg TID if BP soft and unable to give HF medications  AF with RVR  S/p digoxin dose (250 mcg IV) on 10/05/22 and 10/06/22  Recommend anticoagulation; discussed risks/benefits and she was willing to re-trial this  No h/o major bleeding; her anticoagulation was stopped previously due to concern for bleeding due to falls  Falling has not been a significant issue lately with using a walker  Discussed R/B/A of anticoagulation with patient and patient's granddaughter, Dr. Chestine Spore, and with shared decision making we decided to start Eliquis 2.5 mg q12h  Monitor CBC as outpatient periodically; monitor for bleeding  S/p TEE/DCCV 10/07/22  Loaded with IV amiodarone 6/5-6/6  Continue amiodarone 200 mg po BID x 7 days, then continue with 200 mg once daily  Discussed potential side effects and need to monitor eyes, thyroid, liver, lungs and skin  TSH, fT4 normal 10/05/22  Acute respiratory failure with hypoxemia; improving  Diuresis  Wean oxygen as tolerated  AoCKD  Monitor renal function, electrolytes with diuresis  Suspect cardiorenal syndrome  HL  Abnormal LFTs; resolved  Suspect congestive hepatopathy   HTN  Improved after DCCV 10/07/22    Anticipate discharge Monday.   [name] will not accept her over the weekend.  She will likely be medically stable for discharge on Monday.    Thank you for the opportunity to participate in the care of Mary Woodward and please do not hesitate to contact us should you have any questions.    Subjective:  No acute events overnight.  Denies chest pain or lightheadedness.  Notes significant improvement in dyspnea.  Ankle edema has resolved.  Seems not all UOP was charted.     Objective:    Temp (24hrs), Avg:98.1 F (36.7 C), Min:97.5 F (36.4 C), Max:99.1 F (37.3 C)    Patient Vitals for the past 8 hrs:   Pulse   10/08/22 1124 67   10/08/22 1030 67   10/08/22 1000 72   10/08/22 0930 74   10/08/22 0915 66   10/08/22 0708 61   10/08/22 0600 56   10/08/22 0400 67    Patient Vitals for the past 8 hrs:   Resp   10/08/22 1124 20   10/08/22 1030 21   10/08/22 0930 18   10/08/22 0708 16    Patient Vitals for the past 8 hrs:   BP   10/08/22 1124 (!) 137/50   10/08/22 1030 (!) 126/39   10/08/22 0939 (!) 99/32   10/08/22 0930 (!) 105/39   10/08/22 0915 (!) 99/32   10/08/22 0708 (!) 125/58          Intake/Output Summary (Last 24 hours)  at 10/08/2022 1134  Last data filed at 10/08/2022 1124  Gross per 24 hour   Intake 440 ml   Output 250 ml   Net 190 ml       Physical Exam  GEN: NAD, appears stated age  HEENT: EOMI   NECK: Normal JVP  CV: Regular, occasional ectopy, normal S1 and S2, no M/R/G  LUNGS: Minimal bibasilar inspiratory crackles  ABD: Soft, ND  EXT: No edema, 2+ and symmetrical radial pulses b/l  PSYCH: Mood and affect normal  NEURO: Alert, MAEW, face symmetrical, speech intact    Current Facility-Administered Medications   Medication Dose Route Frequency    furosemide (LASIX) injection 40 mg  40 mg IntraVENous BID AC    metoprolol succinate (TOPROL XL) extended release tablet 25 mg  25 mg Oral Daily    losartan (COZAAR) tablet 12.5 mg  12.5 mg Oral QPM    [START ON 10/09/2022] furosemide (LASIX) tablet 40 mg  40 mg Oral Daily    amiodarone (CORDARONE)  tablet 200 mg  200 mg Oral BID    multivitamin 1 tablet  1 tablet Oral Daily    metoprolol (LOPRESSOR) injection 5 mg  5 mg IntraVENous Q6H PRN    apixaban (ELIQUIS) tablet 2.5 mg  2.5 mg Oral BID    sodium chloride flush 0.9 % injection 5-40 mL  5-40 mL IntraVENous 2 times per day    sodium chloride flush 0.9 % injection 5-40 mL  5-40 mL IntraVENous PRN    0.9 % sodium chloride infusion   IntraVENous PRN    ondansetron (ZOFRAN-ODT) disintegrating tablet 4 mg  4 mg Oral Q8H PRN    Or    ondansetron (ZOFRAN) injection 4 mg  4 mg IntraVENous Q6H PRN    polyethylene glycol (GLYCOLAX) packet 17 g  17 g Oral Daily PRN    acetaminophen (TYLENOL) tablet 650 mg  650 mg Oral Q6H PRN    Or    acetaminophen (TYLENOL) suppository 650 mg  650 mg Rectal Q6H PRN    atorvastatin (LIPITOR) tablet 10 mg  10 mg Oral Daily    pantoprazole (PROTONIX) tablet 40 mg  40 mg Oral QAM AC       Data Reviewed:  No results for input(s): "PH", "PCO2", "PO2" in the last 72 hours.  Recent Labs     10/05/22  1857 10/06/22  0422 10/06/22  0621   TROPHS 76* 71* 65*     Recent Labs     10/05/22  1857 10/07/22  0311 10/07/22  1205 10/08/22  0322   NA 136 136  --  134*   K 4.1 3.8  --  3.8   CL 101 103  --  101   CO2 27 27  --  26   BUN 45* 46*  --  42*   WBC 7.9  --  9.5  --    HGB 9.9*  --  10.5*  --    HCT 30.9*  --  33.7*  --    PLT 316  --  313  --      Recent Labs     10/05/22  1857 10/07/22  0311 10/08/22  0322   GLOB 3.7 3.6 3.2     Recent Labs     10/06/22  0422   INR 1.0      No results for input(s): "TIBC" in the last 72 hours.    Invalid input(s): "FE", "PSAT", "FERR"   No results found for: "  GLUCPOC"    Recent Results (from the past 24 hour(s))   CBC with Auto Differential    Collection Time: 10/07/22 12:05 PM   Result Value Ref Range    WBC 9.5 3.6 - 11.0 K/uL    RBC 4.14 3.80 - 5.20 M/uL    Hemoglobin 10.5 (L) 11.5 - 16.0 g/dL    Hematocrit 16.1 (L) 35.0 - 47.0 %    MCV 81.4 80.0 - 99.0 FL    MCH 25.4 (L) 26.0 - 34.0 PG    MCHC 31.2 30.0 -  36.5 g/dL    RDW 09.6 (H) 04.5 - 14.5 %    Platelets 313 150 - 400 K/uL    MPV 9.8 8.9 - 12.9 FL    Nucleated RBCs 0.0 0 PER 100 WBC    nRBC 0.00 0.00 - 0.01 K/uL    Neutrophils % 75 32 - 75 %    Lymphocytes % 13 12 - 49 %    Monocytes % 9 5 - 13 %    Eosinophils % 2 0 - 7 %    Basophils % 0 0 - 1 %    Immature Granulocytes % 1 (H) 0.0 - 0.5 %    Neutrophils Absolute 7.2 1.8 - 8.0 K/UL    Lymphocytes Absolute 1.2 0.8 - 3.5 K/UL    Monocytes Absolute 0.8 0.0 - 1.0 K/UL    Eosinophils Absolute 0.2 0.0 - 0.4 K/UL    Basophils Absolute 0.0 0.0 - 0.1 K/UL    Immature Granulocytes Absolute 0.1 (H) 0.00 - 0.04 K/UL    Differential Type AUTOMATED     Comprehensive Metabolic Panel    Collection Time: 10/08/22  3:22 AM   Result Value Ref Range    Sodium 134 (L) 136 - 145 mmol/L    Potassium 3.8 3.5 - 5.1 mmol/L    Chloride 101 97 - 108 mmol/L    CO2 26 21 - 32 mmol/L    Anion Gap 7 5 - 15 mmol/L    Glucose 133 (H) 65 - 100 mg/dL    BUN 42 (H) 6 - 20 MG/DL    Creatinine 4.09 (H) 0.55 - 1.02 MG/DL    BUN/Creatinine Ratio 23 (H) 12 - 20      Est, Glom Filt Rate 25 (L) >60 ml/min/1.41m2    Calcium 8.8 8.5 - 10.1 MG/DL    Total Bilirubin 0.7 0.2 - 1.0 MG/DL    ALT 53 12 - 78 U/L    AST 25 15 - 37 U/L    Alk Phosphatase 77 45 - 117 U/L    Total Protein 6.1 (L) 6.4 - 8.2 g/dL    Albumin 2.9 (L) 3.5 - 5.0 g/dL    Globulin 3.2 2.0 - 4.0 g/dL    Albumin/Globulin Ratio 0.9 (L) 1.1 - 2.2     Magnesium    Collection Time: 10/08/22  3:22 AM   Result Value Ref Range    Magnesium 2.7 (H) 1.6 - 2.4 mg/dL       Telemetry (personally reviewed): normal sinus rhythm, frequent PACs    Signed:  Kermit Balo. Dory Peru, MD  Structural / Interventional Cardiology  [facility]  10/08/22

## 2022-10-08 NOTE — Progress Notes (Signed)
Physician Progress Note      PATIENT:               Mary Woodward, Mary Woodward  CSN #:                  [id number]  DOB:                       1928/02/06  ADMIT DATE:       10/05/2022 6:49 PM  DISCH DATE:  Amedeo Gory  PROVIDER #:        Guadalupe Dawn MD          QUERY TEXT:    Patient admitted with Acute on Chronic CHF. Noted documentation of acute   respiratory failure per the Cardiology consultant with attending documenting   hypoxia. There is documentation of no resp distress or accessory muscle use.  In order to support the diagnosis of acute respiratory failure, please include   additional clinical indicators in your documentation.  Or please document if   the diagnosis of acute respiratory failure has been ruled out after further   study.    The medical record reflects the following:  Risk Factors: A/C CHF, A-fib    Clinical Indicators:  6/4 18:48 97.6, 157, 16, 105/55, O2 Sat 99% on r/a  6/5 11:15 HR 108, RR 19, O2 Sat 85% on r/a, placed on 2L O2  13:31, HR 124, RR 21, O2 Sat 92% on 2L O2    ER- worsening SOB w/ activity and now speaking. Tachypnea present. No   accessory muscle usage or respiratory distress.  Breath sounds: Normal breath sounds. No stridor. No decreased breath sounds,   wheezing, rhonchi or rales. assess;  1. Acute on chronic congestive heart failure, unspecified heart failure type  2. Atrial fibrillation with RVR (HCC)    H&P- no acute respiratory distress., fine basilar rales on exam, +JVD. Atrial   fibrillation with rapid ventricular response -now with hypotension, Acute on   chronic HFpEF, Pulmonary edema    6/5 Cardiac consult- Acute on chronic diastolic CHF, Acute resp failure w/   hypoxemia    6/6 PN- Hypoxia due to CHF, Supplemental oxygen to keep SpO2 above 92%    Treatment: O2 1-2L, diuresis wiht Lasix/ Diamox IV    Acute Respiratory Failure Clinical Indicators per 22M MS-DRG Training Guide and   Quick Reference Guide:  pO2 < 60 mmHg or SpO2 (pulse oximetry) < 91% breathing room  air  pCO2 > 50 and pH < 7.35  P/F ratio (pO2 / FIO2) < 300  pO2 decrease or pCO2 increase by 10 mmHg from baseline (if known)  Supplemental oxygen of 40% or more  Presence of respiratory distress, tachypnea, dyspnea, shortness of breath,   wheezing  Unable to speak in complete sentences  Use of accessory muscles to breathe  Extreme anxiety and feeling of impending doom  Tripod position  Confusion/altered mental status/obtunded    Thank you,  Rica Koyanagi RN  Clinical Documentation  [phone], or via Perfect Serve  Options provided:  -- Acute Respiratory Failure as evidenced by, Please document evidence.  -- Acute Respiratory Failure ruled out after study  -- Other - I will add my own diagnosis  -- Disagree - Not applicable / Not valid  -- Disagree - Clinically unable to determine / Unknown  -- Refer to Clinical Documentation Reviewer    PROVIDER RESPONSE TEXT:    This patient is in acute respiratory failure as  evidenced by SOB and hypoxia    Query created by: Rica Koyanagi on 10/08/2022 1:41 PM      QUERY TEXT:    Patient admitted with Acute on Chronic CHF, and A-Fib. Pt noted to have atrial   fibrillation and has been started on Eliquis. If possible, please document in   progress notes and discharge summary if you are evaluating and/or treating   any of the following:    The medical record reflects the following:  Risk Factors: 89 yo female with CHF, HTN, hx of PE  Clinical Indicators: Admitted with A/C CHF and noted to be in A-Fib, with   documentation of CHA2 DS2 VASc= 4 (CHF= 1, Age=> 77= 2, sex female = 1)   Eliquis has been initiated.  Treatment: Eliquis BID    Thank you,  Rica Koyanagi RN  Clinical Documentation  [phone], or via Perfect Serve  Options provided:  -- Secondary hypercoagulable state in a patient with atrial fibrillation  -- Other - I will add my own diagnosis  -- Disagree - Not applicable / Not valid  -- Disagree - Clinically unable to determine / Unknown  -- Refer to  Clinical Documentation Reviewer    PROVIDER RESPONSE TEXT:    This patient has secondary hypercoagulable state in a patient with atrial   fibrillation.    Query created by: Rica Koyanagi on 10/08/2022 1:47 PM      Electronically signed by:  Guadalupe Dawn MD 10/08/2022 4:31 PM

## 2022-10-08 NOTE — Plan of Care (Signed)
Problem: Safety - Adult  Goal: Free from fall injury  Outcome: Progressing     Problem: ABCDS Injury Assessment  Goal: Absence of physical injury  Outcome: Progressing     Problem: Pain  Goal: Verbalizes/displays adequate comfort level or baseline comfort level  Outcome: Progressing

## 2022-10-08 NOTE — Plan of Care (Signed)
Problem: Occupational Therapy - Adult  Goal: By Discharge: Performs self-care activities at highest level of function for planned discharge setting.  See evaluation for individualized goals.  Description: FUNCTIONAL STATUS PRIOR TO ADMISSION:  Patient was ambulatory using a rollator and was modified independent for basic ADLs within her apartment. [name] assists with meal prep (pt ambulates to dining hall for meals) and medication management.    Receives Help From:  (med management from staff, supportive family locally), ADL Assistance: Independent, Homemaking Assistance: Needs assistance, Ambulation Assistance:  (rollator), Transfer Assistance: Independent, Active Driver: No     HOME SUPPORT: Patient lived alone with ILF to provide assistance for IADLs.    Occupational Therapy Goals:  Initiated 10/08/2022  1.  Patient will perform grooming standing at sink with Modified Independence within 7 day(s).  2.  Patient will perform lower body dressing with Modified Independence within 7 day(s).  3.  Patient will perform bathing with Supervision within 7 day(s).  4.  Patient will perform toilet transfers with Modified Independence  within 7 day(s).  5.  Patient will perform all aspects of toileting with Modified Independence within 7 day(s).  6.  Patient will participate in upper extremity therapeutic exercise/activities with Independence for 5 minutes within 7 day(s).    7.  Patient will utilize energy conservation techniques during functional activities with verbal cues within 7 day(s).  Outcome: Progressing   OCCUPATIONAL THERAPY EVALUATION    Patient: Mary Woodward (89 y.o. female)  Date: 10/08/2022  Primary Diagnosis: Lower extremity edema [R60.0]  Elevated troponin [R79.89]  Atrial fibrillation with rapid ventricular response (HCC) [I48.91]  Atrial fibrillation with RVR (HCC) [I48.91]  Acute on chronic congestive heart failure, unspecified heart failure type (HCC) [I50.9]  Acute renal failure superimposed on  chronic kidney disease, unspecified acute renal failure type, unspecified CKD stage (HCC) [N17.9, N18.9]         Precautions: Fall Risk                  ASSESSMENT :  The patient is limited by decreased functional mobility, independence in ADLs, high-level IADLs, strength, activity tolerance, endurance, balance, posture. Patient initially presented with shortness of breath, weight gain, and right ankle swelling. Received semi supine in bed and agreeable to working with therapy. Patient transferred to EOB and into standing using a rollator, uses the rollator at all times at home. Patient ambulatory to bathroom and stood at sink >5 minutes for grooming tasks before transferring onto commode to void. Purewick replaced for sitting in chair due to diuretic medications. Patient requiring increased time for transfers and functional tasks this date and granddaughter reports gait speed is slowed from baseline. Patient initially on 1L of oxygen via nasal cannula, oxygen in mid 90s so removed cannula and remained stable in mid 90s on room air throughout session, nursing aware and okay with patient remaining on room air at end of session. Overall patient with good balance but requiring increased time.     Based on the impairments listed above patient presents below but close to her functional baseline independence. Patient would benefit from skilled OT services during admission to improve independence with self care and functional mobility/transfers. Recommend discharge to ILF with resumption of HHOT at this time.    Functional Outcome Measure:  AM-PAC Inpatient Daily Activity Raw Score: 19/24 which is indicative of Cutoff score 39.4 (19) correlates to a good likelihood of discharging home versus a facility.         PLAN :  Recommendations and Planned Interventions:   self care training, therapeutic activities, functional mobility training, balance training, therapeutic exercise, endurance activities, patient education, home  safety training, and family training/education    Frequency/Duration: OT Plan of Care: 5 times/week    Recommendation for discharge: (in order for the patient to meet his/her long term goals): Therapy 2x a week in the home    Other factors to consider for discharge: no additional factors    IF patient discharges home will need the following DME: patient owns DME required for discharge       SUBJECTIVE:   Patient stated, "They got me up to the bathroom last night."  OBJECTIVE DATA SUMMARY:   No past medical history on file.No past surgical history on file.       Expanded or extensive additional review of patient history:   Lives With: Alone  Type of Home: Senior housing apartment  Home Layout: One level  Home Access: Engineer, structural, Level entry        Bathroom Shower/Tub: Forensic psychologist: Grab bars in shower, Academic librarian Accessibility: Accessible  Home Equipment: Rollator  Has the patient had two or more falls in the past year or any fall with injury in the past year?: No      Hand Dominance: right     EXAMINATION OF PERFORMANCE DEFICITS:    Cognitive/Behavioral Status:  Orientation  Overall Orientation Status: Within Normal Limits  Orientation Level: Oriented X4  Cognition  Overall Cognitive Status: WNL    Skin: intact where visible    Edema: none noted    Hearing:   Hearing  Hearing: Within functional limits    Vision/Perceptual:    Vision - Basic Assessment  Patient Visual Report: No visual complaint reported.        Perception  Overall Perceptual Status: WFL    Range of Motion:   AROM: Within functional limits  PROM: Within functional limits      Strength:  Strength: Generally decreased, functional      Coordination:        Coordination: Generally decreased, functional      Tone & Sensation:   Tone: Normal             Functional Mobility and Transfers for ADLs:    Bed Mobility:     Bed Mobility Training  Bed Mobility Training: Yes  Rolling: Stand-by assistance  Supine to Sit: Stand-by  assistance  Sit to Supine:  (left OOB in chair)  Scooting: Stand-by assistance    Transfers:     Art therapist: Yes  Sit to Stand: Stand-by assistance  Stand to Sit: Stand-by assistance  Bed to Chair: Stand-by assistance;Adaptive equipment;Additional time  Toilet Transfer: Stand-by assistance;Adaptive equipment;Additional time      Balance:     Balance  Sitting: Intact  Standing: Impaired  Standing - Static: Good;Constant support  Standing - Dynamic: Good;Constant support        ADL Assessment:          Feeding: Independent (infer from functional reach)       Grooming: Stand by assistance (standing at sink)       UE Bathing: Stand by assistance;Based on clinical judgement       LE Bathing: Contact guard assistance;Based on clinical judgement       UE Dressing: Stand by assistance;Based on clinical judgement       LE Dressing: Contact guard assistance;Based on clinical judgement  Toileting: Stand by assistance              ADL Intervention and task modifications:    Feeding: .  - Container Management : Minimal Assistance and Seated in Chair  - Food to Mouth: Independent and Seated in chair  - Drink to Mouth : Independent and Seated in chair    Grooming: .  - Face Washing : Stand-by Assistance and Standing at Sink  - Oral Care: Stand-by Assistance and Standing at sink  - Brushing/Combing Hair : Stand-by Assistance and Standing at sink  - Washing Hands : Stand-by Assistance and Standing at sink    Toileting: .  - Bladder Hygiene : Stand-by Assistance and Seated   - Clothing Management : Stand-By Assistance and Standing                            [employer] AM-PACTM "6 Clicks"                                                       Daily Activity Inpatient Short Form  AM-PAC Daily Activity - Inpatient   How much help is needed for putting on and taking off regular lower body clothing?: A Little  How much help is needed for bathing (which includes washing, rinsing, drying)?: A Little  How  much help is needed for toileting (which includes using toilet, bedpan, or urinal)?: A Little  How much help is needed for putting on and taking off regular upper body clothing?: A Little  How much help is needed for taking care of personal grooming?: A Little  How much help for eating meals?: None  AM-PAC Inpatient Daily Activity Raw Score: 19  AM-PAC Inpatient ADL T-Scale Score : 40.22  ADL Inpatient CMS 0-100% Score: 42.8  ADL Inpatient CMS G-Code Modifier : CK     Interpretation of Tool:  Represents clinically-significant functional categories (i.e. Activities of daily living).  Cutoff score 39.4 (19) correlates to a good likelihood of discharging home versus a facility  Diane U. Frederick Peers, Janeece Riggers, Leslie Andrea, Lupe Carney. Passek, Thornton Dales. Cassandria Anger, AM-PAC "6-Clicks" Functional Assessment Scores Predict Acute Care Hospital Discharge Destination, Physical Therapy, Volume 94, Issue 9, 01 January 2013, Pages [phone], [email or web address]       Pain Rating:  None reported     Pain Intervention(s):   pain is at a level acceptable to the patient    Activity Tolerance:   Good, requires rest breaks, and SpO2 stable on room air    After treatment:   Patient left in no apparent distress sitting up in chair, Call bell within reach, Bed/ chair alarm activated, and Caregiver / family present    COMMUNICATION/EDUCATION:   The patient's plan of care was discussed with: physical therapist, registered nurse, and case manager    Patient Education  Education Given To: Patient;Family  Education Provided: Role of Therapy;Plan of Armed forces operational officer;Fall Prevention Strategies;Mobility Training;Family Education  Education Method: Demonstration;Verbal  Barriers to Learning: None  Education Outcome: Verbalized understanding;Demonstrated understanding;Continued education needed    Thank you for this referral.  Steva Ready, OTR/L  Minutes: 36    Occupational  Therapy Evaluation Charge Determination   History Examination Decision-Making   LOW Complexity : Brief history review  LOW Complexity: 1-3 Performance deficits relating to physical, cognitive, or psychosocial skills that result in activity limitations and/or participation restrictions LOW Complexity: No comorbidities that affect functional and  no verbal  or physical assist needed to complete eval tasks   Based on the above components, the patient evaluation is determined to be of the following complexity level: Low

## 2022-10-08 NOTE — Care Coordination-Inpatient (Signed)
Transition of Care Plan:    RUR: 17%  Prior Level of Functioning: [name] & they administer her meds, rollator for mobility and participates in communities onsite therapy.  Disposition: Anticipated d/c 6/10 bc [name] doesn't accept their residents back on the weekend.   ILF contact, Social Worker, [place] [phone], fax: [phone]) at [name].   ILF requires Rapid Covid test, CXR and therapy orders to accept residents back.    If SNF or IPR: Date FOC offered: NA  Date FOC received:   Accepting facility:   Date authorization started with reference number:   Date authorization received and expires:   Follow up appointments: Cardio  DME needed: TBD  Transportation at discharge: Family  IM/IMM Medicare/Tricare letter given: 6/6  Is patient a Veteran and connected with VA?    If yes, was [employer] transfer form completed and VA notified?   Caregiver Contact: Daughter, Cassie Freer [phone]   Discharge Caregiver contacted prior to discharge?   Care Conference needed?   Barriers to discharge:  Medical    Phone call to Danielle at [facility] to provide an update on the anticipated d/c date and she reports they do not accept residents back from the hospital on the weekend so patient will need to want until Monday, 6/10 to return.      Spoke w patient and family at bedside and they are hoping the physician can send rx's to the preferred pharmacy a couple days prior to d/c bc it took several days for the pharmacy to fill her meds last time and ILF doesn't allow her meds to get filled by another pharmacy.  They are also hoping notes or orders can be sent to the [facility] recommending daily weight every morning before breakfast in the nude and recommendations for a low sodium diet. They agreed for CM to send updated clinicals today to [name] for their review.     Faxed clinicals to [name] for their review.    Angelica Ran, MSW CCM  Care Management

## 2022-10-08 NOTE — Plan of Care (Signed)
Problem: Physical Therapy - Adult  Goal: By Discharge: Performs mobility at highest level of function for planned discharge setting.  See evaluation for individualized goals.  Description: FUNCTIONAL STATUS PRIOR TO ADMISSION: Patient was modified independent using a rollator for functional mobility. No falls in last 6 months. Ambulates to dining hall for meals without taking a seated rest break on rollator. Only receives medication assistance at ILF.     HOME SUPPORT PRIOR TO ADMISSION: The patient lived alone at ILF with children and grandchildren locally to provide assistance.    Physical Therapy Goals  Initiated 10/08/2022  1.  Patient will move from supine to sit and sit to supine, scoot up and down, and roll side to side in bed with modified independence within 7 day(s).    2.  Patient will perform sit to stand with modified independence within 7 day(s).  3.  Patient will transfer from bed to chair and chair to bed with modified independence using the least restrictive device within 7 day(s).  4.  Patient will ambulate with modified independence for 250 feet with the least restrictive device within 7 day(s).       Outcome: Progressing   PHYSICAL THERAPY EVALUATION    Patient: Mary Woodward (89 y.o. female)  Date: 10/08/2022  Primary Diagnosis: Lower extremity edema [R60.0]  Elevated troponin [R79.89]  Atrial fibrillation with rapid ventricular response (HCC) [I48.91]  Atrial fibrillation with RVR (HCC) [I48.91]  Acute on chronic congestive heart failure, unspecified heart failure type (HCC) [I50.9]  Acute renal failure superimposed on chronic kidney disease, unspecified acute renal failure type, unspecified CKD stage (HCC) [N17.9, N18.9]       Precautions:       ASSESSMENT :   DEFICITS/IMPAIRMENTS:   The patient is limited by decreased strength, endurance, balance following admission for afib with RVR and now s/p cardioversion. Resting HR 60's and within the same range (60's-70's) after ambulating 200 ft with  RW. No LOB or instability with ambulation and reported fatigue at 150 ft. VSS throughout. Returned to chair and educated patient and family on importance of ambulating multiple times daily (safe to ambulate with family) to prevent deconditioning and increasing fall risk. Recommend resumption of HHPT at d/c.     Based on the impairments listed above recommend HHPT.    Patient will benefit from skilled intervention to address the above impairments.    Functional Outcome Measure:  The patient scored 23/24 on the AMPAC outcome measure which is indicative of increased likelihood of d/c home.           PLAN :  Recommendations and Planned Interventions:   bed mobility training, transfer training, gait training, therapeutic exercises, neuromuscular re-education, patient and family training/education, and therapeutic activities    Frequency/Duration: Patient will be followed by physical therapy to address goals, PT Plan of Care: 5 times/week to address goals.    Recommend with staff: therapy recommendations for staff: ambulate with one assist (safe with family) and rollator 3x/daily    Recommend for next PT session: further progression of gait with existing device    Recommendation for discharge: (in order for the patient to meet his/her long term goals): Therapy 2x a week in the home    Other factors to consider for discharge: no additional factors    IF patient discharges home will need the following DME: patient owns DME required for discharge                SUBJECTIVE:   Patient  stated "I'm starting to get tired."    OBJECTIVE DATA SUMMARY:       No past medical history on file.No past surgical history on file.    Home Situation:  Social/Functional History  Lives With: Alone  Type of Home: Senior housing apartment  Home Layout: One level  Home Access: Engineer, structural, Level entry  Bathroom Shower/Tub: Event organiser: Grab bars in shower, Academic librarian Accessibility: Accessible  Home Equipment:  Rollator  Has the patient had two or more falls in the past year or any fall with injury in the past year?: No  Receives Help From:  (med management from staff, supportive family locally)  ADL Assistance: Independent  Homemaking Assistance: Needs assistance  Ambulation Assistance:  (rollator)  Transfer Assistance: Independent  Active Driver: No  Patient's Herbalist: daughter  Mode of Transportation: Set designer  Occupation: Retired  Heritage manager:    Heritage manager: Generally decreased, functional  Tone & Sensation:   Tone: Normal  Sensation: Intact  Coordination:  Coordination: Generally decreased, functional  Range Of Motion:  AROM: Generally decreased, functional  Functional Mobility:  Bed Mobility:  Bed Mobility Training  Bed Mobility Training: Yes  Transfers:  Art therapist: Yes  Sit to Stand: Stand-by assistance  Stand to Sit: Stand-by assistance  Balance:   Balance  Sitting: Intact  Standing: Impaired  Standing - Static: Good;Constant support  Standing - Dynamic: Good;Constant support  Ambulation/Gait Training:  Soil scientist: Yes  Overall Level of Assistance: Stand-by assistance  Distance (ft): 200 Feet  Assistive Device: Gait belt;Walker, rollator  Base of Support: Narrowed  Speed/Cadence: Pace decreased (< 100 feet/min)  Step Length: Right shortened;Left shortened  Gait Abnormalities: Decreased step clearance                                                                                                                                                                                                                                             [employer] AM-PAC      Basic Mobility Inpatient Short Form (6-Clicks) Version 2    How much help is needed turning from your back to your side while in a flat bed without using bedrails?: None  How much help is needed moving from lying on your back to sitting on the side of a flat bed without using bedrails?: None  How much help is needed moving to  and from a bed to a chair?: None  How much help is needed standing up from a chair using your arms?: None  How much help is needed walking in hospital room?: None  How much help is needed climbing 3-5 steps with a railing?: A Little    AM-PAC Inpatient Mobility Raw Score : 23  AM-PAC Inpatient T-Scale Score : 56.93     Cutoff score ?171,2,3 had higher odds of discharging home with home health or need of SNF/IPR.    1. Emelia Loron, Janeece Riggers, Vinoth Fransico Meadow, Lupe Carney Passek, Thornton Dales. Cassandria Anger.  Validity of the AM-PAC "6-Clicks" Inpatient Daily Activity and Basic Mobility Short Forms. Physical Therapy Mar 2014, [phone] (3) 379-391; DOI: 10.2522/ptj.20130199  2. Venetia Night. Association of AM-PAC "6-Clicks" Basic Mobility and Daily Activity Scores With Discharge Destination. Phys Ther. 2021 Apr 4;101(4):pzab043. doi: 10.1093/ptj/pzab043. PMID: 16109604.  3. Herbold J, Rajaraman D, Lubertha Basque, Agayby K, Poteau S. Activity Measure for Post-Acute Care "6-Clicks" Basic Mobility Scores Predict Discharge Destination After Acute Care Hospitalization in Select Patient Groups: A Retrospective, Observational Study. Arch Rehabil Res Clin Transl. 2022 Jul 16;4(3):100204. doi: 10.1016/j.[email or web address].[id number]. PMID: 78295621; PMCID: [id number].  4. Josefina Do, Coster W, Ni P. AM-PAC Short Forms Manual 4.0. Revised 06/2018.                                                                                                                                                                                                                                   Activity Tolerance:   Good and SpO2 stable on room air    After treatment:   Patient left in no apparent distress sitting up in chair, Call bell within reach, Bed/ chair alarm activated, and Caregiver / family present    COMMUNICATION/EDUCATION:   The patient's plan of care was discussed with: occupational therapist and registered  nurse         Thank you for this referral.  Carlynn Herald, PT, DPT  Minutes: 25      Physical Therapy Evaluation Charge Determination   History Examination Presentation Decision-Making   HIGH Complexity :3+ comorbidities / personal factors will impact the outcome/ POC  HIGH Complexity : 4+ Standardized tests and measures addressing body structure, function, activity limitation and / or participation in recreation  LOW Complexity :  Stable, uncomplicated  AM-PAC  LOW    Based on the above components, the patient evaluation is determined to be of the following complexity level: Low

## 2022-10-08 NOTE — Progress Notes (Signed)
Hospitalist Progress Note  Katrine Coho, MD  Answering service: [phone]        Date of Service:  10/08/2022  NAME:  Mary Woodward  DOB:  08/28/1927  MRN:  [id number]      Admission Summary:     Patient admitted with A-fib with RVR.    Interval history / Subjective:   Seen and examined for follow up afib s/p cardioversion. Reports feeling well today, no acute complaints. Family at bedside.      Assessment & Plan:     Atrial fibrillation with rapid ventricular response  -IV amiodarone transition to p.o.  S/p TEE, no thrombus, underwent cardioversion.  Started on apixaban.  -PO amiodarone started today    Acute on chronic HFrEF.  Echocardiogram today showed EF of 20-25%, mild MR, severely dilated LA of note, recent echocardiogram showed a normal EF of 50-55%.  Drop in EF likely due to tachyarrhythmia  -Continue IV Lasix with hold parameters.  -Cardiac GDMT per cardiology but hypotension and CKD, advanced age may be limiting.  -On lasix 40 mg IV BID for now and will DC start PO tomorrow   -Continue metoprolol and losartan with parameters  -May be started on midodrine if unable to tolerate meds    Hypoxia due to CHF: Supplemental oxygen to keep SpO2 above 92%    Abnormal LFTs, resolved.  -Likely congestive hepatopathy    Hypotension  -Likely induced by tachycardia  -Midodrine as needed    Dyslipidemia  -Continue statin    GERD  -Continue PPI    History of PE  -No longer on anticoagulation due to frequent falls.  She is now started on apixaban for stroke prevention    CKD stage IV, bump in creatinine could be due to hypoperfusion due to tachyarrhythmia, hypotension.  Creatinine improving.  Monitor.  Avoid nephrotoxic agents.     Code status: Full  Prophylaxis: SCDs  Care Plan discussed with: Patient and RN.  Anticipated Disposition: 24 to 48 hours  Central Line:   None             Review of Systems:   Pertinent  items are noted in HPI.         Vital Signs:    Last 24hrs VS reviewed since prior progress note. Most recent are:  Vitals:    10/08/22 0930   BP: (!) 105/39   Pulse: 74   Resp: 18   Temp:    SpO2: 93%         Intake/Output Summary (Last 24 hours) at 10/08/2022 8295  Last data filed at 10/08/2022 6213  Gross per 24 hour   Intake 300 ml   Output 500 ml   Net -200 ml          Physical Examination:     I had a face to face encounter with this patient and independently examined them on 10/08/2022 as outlined below:          General : alert x 3, awake, no acute distress,   HEENT: PEERL, EOMI, moist mucus membrane  Neck: supple, no JVD, no meningeal signs  Chest: On oxygen via nasal cannula 2 L SpO2 100%.  Clear to auscultation bilaterally   CVS: Tachycardic, irregularly irregular.    Abd: soft/ non tender, non distended, BS physiological,   Ext: No peripheral edema.  Neuro/Psych: pleasant mood and affect, CN 2-12 grossly intact, sensory grossly within normal limit  Skin: warm  Data Review:    Review and/or order of clinical lab test    I have independently reviewed and interpreted patient's lab and all other diagnostic data    Notes reviewed from all clinical/nonclinical/nursing services involved in patient's clinical care. Care coordination discussions were held with appropriate clinical/nonclinical/ nursing providers based on care coordination needs.     Labs:     Recent Labs     10/05/22  1857 10/07/22  1205   WBC 7.9 9.5   HGB 9.9* 10.5*   HCT 30.9* 33.7*   PLT 316 313       Recent Labs     10/05/22  1857 10/07/22  0311 10/08/22  0322   NA 136 136 134*   K 4.1 3.8 3.8   CL 101 103 101   CO2 27 27 26    BUN 45* 46* 42*   MG  --   --  2.7*       Recent Labs     10/05/22  1857 10/07/22  0311 10/08/22  0322   ALT 105* 71 53   GLOB 3.7 3.6 3.2       Recent Labs     10/06/22  0422   INR 1.0        No results for input(s): "TIBC" in the last 72 hours.    Invalid input(s): "FE", "PSAT", "FERR"   No results found for:  "RBCF"   No results for input(s): "PH", "PCO2", "PO2" in the last 72 hours.  No results for input(s): "CPK" in the last 72 hours.    Invalid input(s): "CPKMB", "CKNDX", "TROIQ"  No results found for: "CHOL", "CHLST", "CHOLV", "HDL", "HDLC", "LDL"  No results found for: "GLUCPOC"  @LABUA @      Medications Reviewed:     Current Facility-Administered Medications   Medication Dose Route Frequency    furosemide (LASIX) injection 40 mg  40 mg IntraVENous BID AC    metoprolol succinate (TOPROL XL) extended release tablet 25 mg  25 mg Oral Daily    losartan (COZAAR) tablet 12.5 mg  12.5 mg Oral QPM    amiodarone (CORDARONE) tablet 200 mg  200 mg Oral BID    multivitamin 1 tablet  1 tablet Oral Daily    metoprolol (LOPRESSOR) injection 5 mg  5 mg IntraVENous Q6H PRN    apixaban (ELIQUIS) tablet 2.5 mg  2.5 mg Oral BID    sodium chloride flush 0.9 % injection 5-40 mL  5-40 mL IntraVENous 2 times per day    sodium chloride flush 0.9 % injection 5-40 mL  5-40 mL IntraVENous PRN    0.9 % sodium chloride infusion   IntraVENous PRN    ondansetron (ZOFRAN-ODT) disintegrating tablet 4 mg  4 mg Oral Q8H PRN    Or    ondansetron (ZOFRAN) injection 4 mg  4 mg IntraVENous Q6H PRN    polyethylene glycol (GLYCOLAX) packet 17 g  17 g Oral Daily PRN    acetaminophen (TYLENOL) tablet 650 mg  650 mg Oral Q6H PRN    Or    acetaminophen (TYLENOL) suppository 650 mg  650 mg Rectal Q6H PRN    atorvastatin (LIPITOR) tablet 10 mg  10 mg Oral Daily    pantoprazole (PROTONIX) tablet 40 mg  40 mg Oral QAM AC     ______________________________________________________________________  EXPECTED LENGTH OF STAY: 3  ACTUAL LENGTH OF STAY:          3  Bonner Puna, MD

## 2022-10-09 LAB — COMPREHENSIVE METABOLIC PANEL
ALT: 45 U/L (ref 12–78)
AST: 15 U/L (ref 15–37)
Albumin/Globulin Ratio: 1.2 (ref 1.1–2.2)
Albumin: 3.7 g/dL (ref 3.5–5.0)
Alk Phosphatase: 72 U/L (ref 45–117)
Anion Gap: 4 mmol/L — ABNORMAL LOW (ref 5–15)
BUN/Creatinine Ratio: 22 — ABNORMAL HIGH (ref 12–20)
BUN: 42 MG/DL — ABNORMAL HIGH (ref 6–20)
CO2: 29 mmol/L (ref 21–32)
Calcium: 9.3 MG/DL (ref 8.5–10.1)
Chloride: 99 mmol/L (ref 97–108)
Creatinine: 1.94 MG/DL — ABNORMAL HIGH (ref 0.55–1.02)
Est, Glom Filt Rate: 24 mL/min/{1.73_m2} — ABNORMAL LOW (ref >60)
Globulin: 3.2 g/dL (ref 2.0–4.0)
Glucose: 111 mg/dL — ABNORMAL HIGH (ref 65–100)
Potassium: 4.1 mmol/L (ref 3.5–5.1)
Sodium: 132 mmol/L — ABNORMAL LOW (ref 136–145)
Total Bilirubin: 0.8 MG/DL (ref 0.2–1.0)
Total Protein: 6.9 g/dL (ref 6.4–8.2)

## 2022-10-09 LAB — BRAIN NATRIURETIC PEPTIDE: NT Pro-BNP: 6211 PG/ML — ABNORMAL HIGH (ref <450)

## 2022-10-09 MED ORDER — SENNA-DOCUSATE SODIUM 8.6-50 MG PO TABS
8.6-50 MG | Freq: Every day | ORAL | Status: DC
Start: 2022-10-09 — End: 2022-10-10
  Administered 2022-10-09 – 2022-10-10 (×2): 1 via ORAL

## 2022-10-09 MED FILL — PANTOPRAZOLE SODIUM 40 MG PO TBEC: 40 MG | ORAL | Qty: 1

## 2022-10-09 MED FILL — FUROSEMIDE 40 MG PO TABS: 40 MG | ORAL | Qty: 1

## 2022-10-09 MED FILL — AMIODARONE HCL 200 MG PO TABS: 200 MG | ORAL | Qty: 1

## 2022-10-09 MED FILL — ATORVASTATIN CALCIUM 10 MG PO TABS: 10 MG | ORAL | Qty: 1

## 2022-10-09 MED FILL — MIRALAX 17 G PO PACK: 17 g | ORAL | Qty: 1

## 2022-10-09 MED FILL — LOSARTAN POTASSIUM 25 MG PO TABS: 25 MG | ORAL | Qty: 1

## 2022-10-09 MED FILL — THERA PO TABS: ORAL | Qty: 1

## 2022-10-09 MED FILL — METOPROLOL SUCCINATE ER 25 MG PO TB24: 25 MG | ORAL | Qty: 1

## 2022-10-09 MED FILL — ELIQUIS 2.5 MG PO TABS: 2.5 MG | ORAL | Qty: 1

## 2022-10-09 MED FILL — SENNA PLUS 8.6-50 MG PO TABS: ORAL | Qty: 1

## 2022-10-09 NOTE — Progress Notes (Signed)
Physician Progress Note      PATIENT:               Mary Woodward, Mary Woodward  CSN #:                  [id number]  DOB:                       [birth year]  ADMIT DATE:       10/05/2022 6:49 PM  DISCH DATE:  Amedeo Gory  PROVIDER #:        Guadalupe Dawn MD          QUERY TEXT:    Noted documentation of Acute Kidney Injury in the History and Physical, and   the Cardiology consult note. CKD 4 is documented in subsequent documentation.  In order to support the diagnosis of AKI, please include additional clinical   indicators in your documentation.? Or please document if the diagnosis of AKI   has been ruled out after further study.    The medical record reflects the following:  Risk Factors: CKD, hypotension    Clinical Indicators:  6/4 creat 2.03  6/6 creat1.94  6/7 creat 1.86    H&P-   Acute kidney injury superimposed on chronic kidney disease  -BUN 45, creatinine 2.03, GFR 23 on 10/05/2022  -Creatinine 1.40, GFR 35 on 09/20/2022    6/5 Cardiac consult- AoCKD. Suspect cardiorenal syndrome    6/6 PN- CKD stage IV, bump in creatinine could be due to hypoperfusion due to   tachyarrhythmia, hypotension.    Treatment: Albumin IV, I/O, NAS diet    Defined by Kidney Disease Improving Global Outcomes (KDIGO) clinical practice   guideline for acute kidney injury:  -Increase in SCr by greater than or equal to 0.3 mg/dl within 48 hours; or  -Increase or decrease in SCr to greater than or equal to 1.5 times baseline,   which is known or presumed to have occurred within the prior 7 days; or  -Urine volume < 0.60ml/kg/h for 6 hours.    Thank you,  Cassandra Humphrey RN  Clinical Documentation  [phone], or via [employer]  Options provided:  -- Acute kidney injury evidenced by, Please document evidence as well as a   numerical baseline creatinine, if known.  -- Acute kidney injury ruled out after study  -- Other - I will add my own diagnosis  -- Disagree - Not applicable / Not valid  -- Disagree - Clinically unable to determine  / Unknown  -- Refer to Clinical Documentation Reviewer    PROVIDER RESPONSE TEXT:    Acute kidney injury was ruled out after study.    Query created by: Rica Koyanagi on 10/08/2022 1:55 PM      Electronically signed by:  Guadalupe Dawn MD 10/09/2022 3:43 PM

## 2022-10-09 NOTE — Progress Notes (Signed)
Hospitalist Progress Note  Katrine Coho, MD  Answering service: [phone]        Date of Service:  10/09/2022  NAME:  Mary Woodward  DOB:  1927-09-24  MRN:  [id number]      Admission Summary:     Patient admitted with A-fib with RVR.    Interval history / Subjective:   Seen and examined for follow up afib s/p cardioversion. No acute changes. Had brief episode of afib overnight. Reports feeling ok.      Assessment & Plan:     Atrial fibrillation with rapid ventricular response  -IV amiodarone transition to p.o.  S/p TEE, no thrombus, underwent cardioversion.  Started on apixaban.  -PO amiodarone started today    Acute on chronic HFrEF.  Echocardiogram today showed EF of 20-25%, mild MR, severely dilated LA of note, recent echocardiogram showed a normal EF of 50-55%.  Drop in EF likely due to tachyarrhythmia  -Continue IV Lasix with hold parameters.  -Cardiac GDMT per cardiology but hypotension and CKD, advanced age may be limiting.  -On lasix 40 mg PO daily now   -Continue metoprolol and losartan with parameters  -May be started on midodrine if unable to tolerate meds    Hypoxia due to CHF: Supplemental oxygen to keep SpO2 above 92%    Abnormal LFTs, resolved.  -Likely congestive hepatopathy    Hypotension  -Likely induced by tachycardia  -Midodrine as needed    Dyslipidemia  -Continue statin    GERD  -Continue PPI    History of PE  -No longer on anticoagulation due to frequent falls.  She is now started on apixaban for stroke prevention    CKD stage IV, bump in creatinine could be due to hypoperfusion due to tachyarrhythmia, hypotension.  Creatinine improving.  Monitor.  Avoid nephrotoxic agents.      Discussed with patient's family and due to previous issues with her medications being delayed when prescribed to her ALF, we will send prescriptions to our pharmacy at [facility] so she can leave here with the  medications. Family has discussed this with her ALF and they have agreed to this.     Code status: Full  Prophylaxis: SCDs  Care Plan discussed with: Patient and RN.  Anticipated Disposition: 24 to 48 hours  Central Line:   None             Review of Systems:   Pertinent items are noted in HPI.         Vital Signs:    Last 24hrs VS reviewed since prior progress note. Most recent are:  Vitals:    10/09/22 0915   BP: 124/80   Pulse: 63   Resp: 16   Temp: 97.5 F (36.4 C)   SpO2:          Intake/Output Summary (Last 24 hours) at 10/09/2022 0946  Last data filed at 10/09/2022 0830  Gross per 24 hour   Intake 996.88 ml   Output 1700 ml   Net -703.12 ml          Physical Examination:     I had a face to face encounter with this patient and independently examined them on 10/09/2022 as outlined below:          General : alert x 3, awake, no acute distress,   HEENT: PEERL, EOMI, moist mucus membrane  Neck: supple, no JVD, no meningeal signs  Chest: On oxygen via nasal cannula 2 L  SpO2 100%.  Clear to auscultation bilaterally   CVS: Tachycardic, irregularly irregular.    Abd: soft/ non tender, non distended, BS physiological,   Ext: No peripheral edema.  Neuro/Psych: pleasant mood and affect, CN 2-12 grossly intact, sensory grossly within normal limit  Skin: warm            Data Review:    Review and/or order of clinical lab test    I have independently reviewed and interpreted patient's lab and all other diagnostic data    Notes reviewed from all clinical/nonclinical/nursing services involved in patient's clinical care. Care coordination discussions were held with appropriate clinical/nonclinical/ nursing providers based on care coordination needs.     Labs:     Recent Labs     10/07/22  1205   WBC 9.5   HGB 10.5*   HCT 33.7*   PLT 313       Recent Labs     10/07/22  0311 10/08/22  0322 10/09/22  0311   NA 136 134* 132*   K 3.8 3.8 4.1   CL 103 101 99   CO2 27 26 29    BUN 46* 42* 42*   MG  --  2.7*  --        Recent Labs      10/07/22  0311 10/08/22  0322 10/09/22  0311   ALT 71 53 45   GLOB 3.6 3.2 3.2       No results for input(s): "INR", "APTT" in the last 72 hours.    Invalid input(s): "PTP"     No results for input(s): "TIBC" in the last 72 hours.    Invalid input(s): "FE", "PSAT", "FERR"   No results found for: "RBCF"   No results for input(s): "PH", "PCO2", "PO2" in the last 72 hours.  No results for input(s): "CPK" in the last 72 hours.    Invalid input(s): "CPKMB", "CKNDX", "TROIQ"  No results found for: "CHOL", "CHLST", "CHOLV", "HDL", "HDLC", "LDL"  No results found for: "GLUCPOC"  @LABUA @      Medications Reviewed:     Current Facility-Administered Medications   Medication Dose Route Frequency    losartan (COZAAR) tablet 12.5 mg  12.5 mg Oral QPM    furosemide (LASIX) tablet 40 mg  40 mg Oral Daily    metoprolol succinate (TOPROL XL) extended release tablet 12.5 mg  12.5 mg Oral Daily    amiodarone (CORDARONE) tablet 200 mg  200 mg Oral BID    multivitamin 1 tablet  1 tablet Oral Daily    metoprolol (LOPRESSOR) injection 5 mg  5 mg IntraVENous Q6H PRN    apixaban (ELIQUIS) tablet 2.5 mg  2.5 mg Oral BID    sodium chloride flush 0.9 % injection 5-40 mL  5-40 mL IntraVENous 2 times per day    sodium chloride flush 0.9 % injection 5-40 mL  5-40 mL IntraVENous PRN    0.9 % sodium chloride infusion   IntraVENous PRN    ondansetron (ZOFRAN-ODT) disintegrating tablet 4 mg  4 mg Oral Q8H PRN    Or    ondansetron (ZOFRAN) injection 4 mg  4 mg IntraVENous Q6H PRN    polyethylene glycol (GLYCOLAX) packet 17 g  17 g Oral Daily PRN    acetaminophen (TYLENOL) tablet 650 mg  650 mg Oral Q6H PRN    Or    acetaminophen (TYLENOL) suppository 650 mg  650 mg Rectal Q6H PRN    atorvastatin (LIPITOR) tablet 10 mg  10  mg Oral Daily    pantoprazole (PROTONIX) tablet 40 mg  40 mg Oral QAM AC     ______________________________________________________________________  EXPECTED LENGTH OF STAY: 6  ACTUAL LENGTH OF STAY:          4                 Katrine Coho, MD

## 2022-10-09 NOTE — Plan of Care (Signed)
Problem: Safety - Adult  Goal: Free from fall injury  10/09/2022 2303 by Lucile Shutters, RN  Outcome: Progressing  10/09/2022 0955 by Lyla Glassing, RN  Outcome: Progressing     Problem: ABCDS Injury Assessment  Goal: Absence of physical injury  10/09/2022 2303 by Lucile Shutters, RN  Outcome: Progressing  10/09/2022 0955 by Lyla Glassing, RN  Outcome: Progressing     Problem: Pain  Goal: Verbalizes/displays adequate comfort level or baseline comfort level  10/09/2022 2303 by Lucile Shutters, RN  Outcome: Progressing  10/09/2022 0955 by Lyla Glassing, RN  Outcome: Progressing

## 2022-10-09 NOTE — Progress Notes (Signed)
[facility] Cardiology Progress Note    Date of service: 10/09/2022    Subjective:   She is feeling much better  No palpitations  No dyspnea    Objective:    Vitals:    10/09/22 0716   BP: (!) 124/43   Pulse: 62   Resp: 16   Temp: 98.4 F (36.9 C)   SpO2: 99%       Physical Exam  Vitals reviewed.   Constitutional:       General: She is not in acute distress.  HENT:      Head: Normocephalic.   Eyes:      General: No scleral icterus.     Conjunctiva/sclera: Conjunctivae normal.   Cardiovascular:      Rate and Rhythm: Normal rate and regular rhythm.      Heart sounds: Normal heart sounds. No murmur heard.  Pulmonary:      Effort: Pulmonary effort is normal.      Breath sounds: Normal breath sounds.   Abdominal:      General: Abdomen is flat.      Tenderness: There is no abdominal tenderness.   Musculoskeletal:         General: No swelling or deformity.   Skin:     General: Skin is warm and dry.   Neurological:      General: No focal deficit present.      Mental Status: She is alert. Mental status is at baseline.   Psychiatric:         Mood and Affect: Mood normal.         Behavior: Behavior normal.         Data reviewed:  Recent Results (from the past 12 hour(s))   Comprehensive Metabolic Panel    Collection Time: 10/09/22  3:11 AM   Result Value Ref Range    Sodium 132 (L) 136 - 145 mmol/L    Potassium 4.1 3.5 - 5.1 mmol/L    Chloride 99 97 - 108 mmol/L    CO2 29 21 - 32 mmol/L    Anion Gap 4 (L) 5 - 15 mmol/L    Glucose 111 (H) 65 - 100 mg/dL    BUN 42 (H) 6 - 20 MG/DL    Creatinine 3.24 (H) 0.55 - 1.02 MG/DL    BUN/Creatinine Ratio 22 (H) 12 - 20      Est, Glom Filt Rate 24 (L) >60 ml/min/1.1m2    Calcium 9.3 8.5 - 10.1 MG/DL    Total Bilirubin 0.8 0.2 - 1.0 MG/DL    ALT 45 12 - 78 U/L    AST 15 15 - 37 U/L    Alk Phosphatase 72 45 - 117 U/L    Total Protein 6.9 6.4 - 8.2 g/dL    Albumin 3.7 3.5 - 5.0 g/dL    Globulin 3.2 2.0 - 4.0 g/dL    Albumin/Globulin Ratio 1.2 1.1 - 2.2     Brain Natriuretic Peptide    Collection  Time: 10/09/22  3:11 AM   Result Value Ref Range    NT Pro-BNP 6,211 (H) <450 PG/ML       Telemetry: NSR with PACs    Assessment and Plan:    Acute on chronic systolic CHF  Much improved  PO lasix 40mg  daily  Metoprolol  Losartan    Paroxysmal a.fib with RVR  Maintaining NSR  Continue amiodarone 200mg  bid x 7 days, then 200mg  once daily  Continue Eliquis 2.5mg  bid    Stable  for discharge from CV standpoint  Signing off. Available to see / discuss as needed upon request.   Cardiology FU with Dr Dory Peru in 1 month      Signed:  Shabrea Weldin L. Sedonia Small, MD  Interventional Cardiology  10/09/2022

## 2022-10-10 LAB — BASIC METABOLIC PANEL
Anion Gap: 7 mmol/L (ref 5–15)
BUN/Creatinine Ratio: 23 — ABNORMAL HIGH (ref 12–20)
BUN: 48 MG/DL — ABNORMAL HIGH (ref 6–20)
CO2: 26 mmol/L (ref 21–32)
Calcium: 8.8 MG/DL (ref 8.5–10.1)
Chloride: 97 mmol/L (ref 97–108)
Creatinine: 2.09 MG/DL — ABNORMAL HIGH (ref 0.55–1.02)
Est, Glom Filt Rate: 22 mL/min/{1.73_m2} — ABNORMAL LOW (ref >60)
Glucose: 112 mg/dL — ABNORMAL HIGH (ref 65–100)
Potassium: 4.3 mmol/L (ref 3.5–5.1)
Sodium: 130 mmol/L — ABNORMAL LOW (ref 136–145)

## 2022-10-10 MED ORDER — SENNA-DOCUSATE SODIUM 8.6-50 MG PO TABS
8.6-50 MG | Freq: Every day | ORAL | Status: AC
Start: 2022-10-10 — End: 2022-10-11
  Administered 2022-10-11: 13:00:00 2 via ORAL

## 2022-10-10 MED FILL — PANTOPRAZOLE SODIUM 40 MG PO TBEC: 40 MG | ORAL | Qty: 1

## 2022-10-10 MED FILL — SENNA PLUS 8.6-50 MG PO TABS: ORAL | Qty: 1

## 2022-10-10 MED FILL — AMIODARONE HCL 200 MG PO TABS: 200 MG | ORAL | Qty: 1

## 2022-10-10 MED FILL — LOSARTAN POTASSIUM 25 MG PO TABS: 25 MG | ORAL | Qty: 1

## 2022-10-10 MED FILL — FUROSEMIDE 40 MG PO TABS: 40 MG | ORAL | Qty: 1

## 2022-10-10 MED FILL — METOPROLOL SUCCINATE ER 25 MG PO TB24: 25 MG | ORAL | Qty: 1

## 2022-10-10 MED FILL — ELIQUIS 2.5 MG PO TABS: 2.5 MG | ORAL | Qty: 1

## 2022-10-10 MED FILL — ATORVASTATIN CALCIUM 10 MG PO TABS: 10 MG | ORAL | Qty: 1

## 2022-10-10 MED FILL — THERA PO TABS: ORAL | Qty: 1

## 2022-10-10 NOTE — Progress Notes (Signed)
Hospitalist Progress Note  Katrine Coho, MD  Answering service: [phone]        Date of Service:  10/10/2022  NAME:  Mary Woodward  DOB:  [birth year]  MRN:  [id number]      Admission Summary:     Patient admitted with A-fib with RVR.    Interval history / Subjective:   Seen and examined for follow up afib s/p cardioversion. No acute complaints. Family concerned about the lack of BM.      Assessment & Plan:     Atrial fibrillation with rapid ventricular response  -IV amiodarone transition to p.o.  S/p TEE, no thrombus, underwent cardioversion.  Started on apixaban.  -PO amiodarone    Acute on chronic HFrEF.  Echocardiogram today showed EF of 20-25%, mild MR, severely dilated LA of note, recent echocardiogram showed a normal EF of 50-55%.  Drop in EF likely due to tachyarrhythmia  -Continue IV Lasix with hold parameters.  -Cardiac GDMT per cardiology but hypotension and CKD, advanced age may be limiting.  -On lasix 40 mg PO daily now   -Continue metoprolol and losartan with parameters  -May be started on midodrine if unable to tolerate meds    Hypoxia due to CHF: Supplemental oxygen to keep SpO2 above 92%    Abnormal LFTs, resolved.  -Likely congestive hepatopathy    Hypotension  -Likely induced by tachycardia  -Midodrine as needed    Dyslipidemia  -Continue statin    GERD  -Continue PPI    History of PE  -No longer on anticoagulation due to frequent falls.  She is now started on apixaban for stroke prevention    CKD stage IV, bump in creatinine could be due to hypoperfusion due to tachyarrhythmia, hypotension.  Creatinine improving.  Monitor.  Avoid nephrotoxic agents.       Code status: Full  Prophylaxis: SCDs  Care Plan discussed with: Patient and RN.  Anticipated Disposition: Back to ALF tomorrow   Central Line:   None             Review of Systems:   Pertinent items are noted in HPI.         Vital Signs:     Last 24hrs VS reviewed since prior progress note. Most recent are:  Vitals:    10/10/22 0800   BP: 111/65   Pulse: 64   Resp: 14   Temp: 97.8 F (36.6 C)   SpO2: 96%         Intake/Output Summary (Last 24 hours) at 10/10/2022 1002  Last data filed at 10/10/2022 8295  Gross per 24 hour   Intake 670 ml   Output 1500 ml   Net -830 ml          Physical Examination:     I had a face to face encounter with this patient and independently examined them on 10/10/2022 as outlined below:          General : alert x 3, awake, no acute distress,   HEENT: PEERL, EOMI, moist mucus membrane  Neck: supple, no JVD, no meningeal signs  Chest: On oxygen via nasal cannula 2 L SpO2 100%.  Clear to auscultation bilaterally   CVS: Tachycardic, irregularly irregular.    Abd: soft/ non tender, non distended, BS physiological,   Ext: No peripheral edema.  Neuro/Psych: pleasant mood and affect, CN 2-12 grossly intact, sensory grossly within normal limit  Skin: warm  Data Review:    Review and/or order of clinical lab test    I have independently reviewed and interpreted patient's lab and all other diagnostic data    Notes reviewed from all clinical/nonclinical/nursing services involved in patient's clinical care. Care coordination discussions were held with appropriate clinical/nonclinical/ nursing providers based on care coordination needs.     Labs:     Recent Labs     10/07/22  1205   WBC 9.5   HGB 10.5*   HCT 33.7*   PLT 313       Recent Labs     10/08/22  0322 10/09/22  0311 10/10/22  0309   NA 134* 132* 130*   K 3.8 4.1 4.3   CL 101 99 97   CO2 26 29 26    BUN 42* 42* 48*   MG 2.7*  --   --        Recent Labs     10/08/22  0322 10/09/22  0311   ALT 53 45   GLOB 3.2 3.2       No results for input(s): "INR", "APTT" in the last 72 hours.    Invalid input(s): "PTP"     No results for input(s): "TIBC" in the last 72 hours.    Invalid input(s): "FE", "PSAT", "FERR"   No results found for: "RBCF"   No results for input(s): "PH", "PCO2",  "PO2" in the last 72 hours.  No results for input(s): "CPK" in the last 72 hours.    Invalid input(s): "CPKMB", "CKNDX", "TROIQ"  No results found for: "CHOL", "CHLST", "CHOLV", "HDL", "HDLC", "LDL"  No results found for: "GLUCPOC"  @LABUA @      Medications Reviewed:     Current Facility-Administered Medications   Medication Dose Route Frequency    sennosides-docusate sodium (SENOKOT-S) 8.6-50 MG tablet 1 tablet  1 tablet Oral Daily    losartan (COZAAR) tablet 12.5 mg  12.5 mg Oral QPM    furosemide (LASIX) tablet 40 mg  40 mg Oral Daily    metoprolol succinate (TOPROL XL) extended release tablet 12.5 mg  12.5 mg Oral Daily    amiodarone (CORDARONE) tablet 200 mg  200 mg Oral BID    multivitamin 1 tablet  1 tablet Oral Daily    metoprolol (LOPRESSOR) injection 5 mg  5 mg IntraVENous Q6H PRN    apixaban (ELIQUIS) tablet 2.5 mg  2.5 mg Oral BID    sodium chloride flush 0.9 % injection 5-40 mL  5-40 mL IntraVENous 2 times per day    sodium chloride flush 0.9 % injection 5-40 mL  5-40 mL IntraVENous PRN    0.9 % sodium chloride infusion   IntraVENous PRN    ondansetron (ZOFRAN-ODT) disintegrating tablet 4 mg  4 mg Oral Q8H PRN    Or    ondansetron (ZOFRAN) injection 4 mg  4 mg IntraVENous Q6H PRN    polyethylene glycol (GLYCOLAX) packet 17 g  17 g Oral Daily PRN    acetaminophen (TYLENOL) tablet 650 mg  650 mg Oral Q6H PRN    Or    acetaminophen (TYLENOL) suppository 650 mg  650 mg Rectal Q6H PRN    atorvastatin (LIPITOR) tablet 10 mg  10 mg Oral Daily    pantoprazole (PROTONIX) tablet 40 mg  40 mg Oral QAM AC     ______________________________________________________________________  EXPECTED LENGTH OF STAY: 6  ACTUAL LENGTH OF STAY:          5  Katrine Coho, MD

## 2022-10-10 NOTE — Plan of Care (Signed)
Problem: Safety - Adult  Goal: Free from fall injury  10/10/2022 2247 by Kerin Ransom, RN  Outcome: Progressing  10/10/2022 1315 by Lyla Glassing, RN  Outcome: Progressing     Problem: Discharge Planning  Goal: Discharge to home or other facility with appropriate resources  10/10/2022 2247 by Kerin Ransom, RN  Outcome: Progressing  Flowsheets (Taken 10/10/2022 2000)  Discharge to home or other facility with appropriate resources: Identify barriers to discharge with patient and caregiver  10/10/2022 1315 by Lyla Glassing, RN  Outcome: Progressing  Flowsheets (Taken 10/10/2022 0800 by Ranae Plumber)  Discharge to home or other facility with appropriate resources: Identify barriers to discharge with patient and caregiver     Problem: ABCDS Injury Assessment  Goal: Absence of physical injury  10/10/2022 2247 by Kerin Ransom, RN  Outcome: Progressing  10/10/2022 1315 by Lyla Glassing, RN  Outcome: Progressing     Problem: Pain  Goal: Verbalizes/displays adequate comfort level or baseline comfort level  10/10/2022 2247 by Kerin Ransom, RN  Outcome: Progressing  Flowsheets (Taken 10/10/2022 2000)  Verbalizes/displays adequate comfort level or baseline comfort level: Encourage patient to monitor pain and request assistance  10/10/2022 1315 by Lyla Glassing, RN  Outcome: Progressing

## 2022-10-11 ENCOUNTER — Inpatient Hospital Stay: Payer: MEDICARE | Primary: Internal Medicine

## 2022-10-11 LAB — BRAIN NATRIURETIC PEPTIDE: NT Pro-BNP: 2856 PG/ML — ABNORMAL HIGH (ref <450)

## 2022-10-11 LAB — BASIC METABOLIC PANEL
Anion Gap: 6 mmol/L (ref 5–15)
BUN/Creatinine Ratio: 26 — ABNORMAL HIGH (ref 12–20)
BUN: 56 MG/DL — ABNORMAL HIGH (ref 6–20)
CO2: 28 mmol/L (ref 21–32)
Calcium: 9.6 MG/DL (ref 8.5–10.1)
Chloride: 96 mmol/L — ABNORMAL LOW (ref 97–108)
Creatinine: 2.18 MG/DL — ABNORMAL HIGH (ref 0.55–1.02)
Est, Glom Filt Rate: 21 mL/min/{1.73_m2} — ABNORMAL LOW (ref >60)
Glucose: 113 mg/dL — ABNORMAL HIGH (ref 65–100)
Potassium: 4.5 mmol/L (ref 3.5–5.1)
Sodium: 130 mmol/L — ABNORMAL LOW (ref 136–145)

## 2022-10-11 LAB — EXTRA TUBES HOLD

## 2022-10-11 LAB — COVID-19 & INFLUENZA COMBO
Rapid Influenza A By PCR: NOT DETECTED
Rapid Influenza B By PCR: NOT DETECTED
SARS-CoV-2, PCR: NOT DETECTED

## 2022-10-11 MED ORDER — METOPROLOL SUCCINATE ER 25 MG PO TB24
25 MG | ORAL_TABLET | Freq: Every day | ORAL | 0 refills | Status: AC
Start: 2022-10-11 — End: 2023-06-06
  Filled 2022-10-11: qty 30, 60d supply, fill #0

## 2022-10-11 MED ORDER — LOSARTAN POTASSIUM 25 MG PO TABS
25 MG | ORAL_TABLET | Freq: Every evening | ORAL | 0 refills | Status: AC
Start: 2022-10-11 — End: 2023-06-06
  Filled 2022-10-11: qty 30, 60d supply, fill #0

## 2022-10-11 MED ORDER — FUROSEMIDE 40 MG PO TABS
40 MG | ORAL_TABLET | Freq: Every day | ORAL | 0 refills | Status: AC
Start: 2022-10-11 — End: 2023-06-06
  Filled 2022-10-11: qty 30, 30d supply, fill #0

## 2022-10-11 MED ORDER — ATORVASTATIN CALCIUM 10 MG PO TABS
10 MG | ORAL_TABLET | Freq: Every day | ORAL | 0 refills | Status: AC
Start: 2022-10-11 — End: 2023-06-06
  Filled 2022-10-11: qty 30, 30d supply, fill #0

## 2022-10-11 MED ORDER — APIXABAN 2.5 MG PO TABS
2.5 MG | ORAL_TABLET | Freq: Two times a day (BID) | ORAL | 0 refills | Status: DC
Start: 2022-10-11 — End: 2023-06-10
  Filled 2022-10-11: qty 60, 30d supply, fill #0

## 2022-10-11 MED ORDER — AMIODARONE HCL 200 MG PO TABS
200 MG | ORAL_TABLET | ORAL | 0 refills | Status: DC
Start: 2022-10-11 — End: 2023-06-10
  Filled 2022-10-11: qty 33, 16d supply, fill #0

## 2022-10-11 MED FILL — AMIODARONE HCL 200 MG PO TABS: 200 MG | ORAL | Qty: 1

## 2022-10-11 MED FILL — ELIQUIS 2.5 MG PO TABS: 2.5 MG | ORAL | Qty: 1

## 2022-10-11 MED FILL — SENNA PLUS 8.6-50 MG PO TABS: ORAL | Qty: 2

## 2022-10-11 MED FILL — PANTOPRAZOLE SODIUM 40 MG PO TBEC: 40 MG | ORAL | Qty: 1

## 2022-10-11 MED FILL — METOPROLOL SUCCINATE ER 25 MG PO TB24: 25 MG | ORAL | Qty: 1

## 2022-10-11 MED FILL — FUROSEMIDE 40 MG PO TABS: 40 MG | ORAL | Qty: 1

## 2022-10-11 MED FILL — THERA PO TABS: ORAL | Qty: 1

## 2022-10-11 MED FILL — ATORVASTATIN CALCIUM 10 MG PO TABS: 10 MG | ORAL | Qty: 1

## 2022-10-11 NOTE — Care Coordination-Inpatient (Signed)
CM received notification from [facility] Transitions Nurse that [facility] did not receive resumption of care orders. CM obtained orders and sent to [facility] via careport.    Shyann Hefner Briscoe Deutscher, BSW  Care management Team Lead

## 2022-10-11 NOTE — Progress Notes (Addendum)
0800: Bedside report received from [place], [place].  Pt in bed resting quietly, dtr to bedside.    0981: MD rounding, pt to be d/c'd today.  Spoke to OP Rx, meds are ready for family to pick up.    1150: COV test to lab.    1219: Order in to check BNP.    1420: Lines d/c'd.  D/c paperwork reviewed with pt and dtr, opportunity for questions provided.    1430: Pt d/c'd off unit in stable condition.

## 2022-10-11 NOTE — Other (Signed)
HF NN noted patient's discharge order.  As patient is 30 day HF readmission, weights were reviewed.  Patient has gained 4 lbs in the past 3 days (6/8 - 123 lbs; 6/9 - 126 lbs; 6/10 - 127 lbs).  Most recent NT pro BNP on 10/09/22 was 6211, down from > 12,000 on admission.  Perfect Serve text sent to Dr. Simonne Maffucci to make aware and requested ordering NT pro BNP prior to discharge to assure it is not increasing.    Awaiting response.      Tresa Endo, RN notified of above.

## 2022-10-11 NOTE — Discharge Summary (Signed)
Discharge Summary       PATIENT ID: Mary Woodward  MRN: [id number]   DATE OF BIRTH: 1928-04-04    DATE OF ADMISSION: 10/05/2022  6:49 PM    DATE OF DISCHARGE: 10/11/22   PRIMARY CARE PROVIDER: Darrell Jewel, MD     ATTENDING PHYSICIAN: Katrine Coho, MD  DISCHARGING PROVIDER: Katrine Coho, MD    To contact this individual call [phone] and ask the operator to page.  If unavailable ask to be transferred the Adult Hospitalist Department.    CONSULTATIONS: IP CONSULT TO CARDIOLOGY  IP CONSULT TO CARDIOLOGY  IP CONSULT TO INTENSIVIST    PROCEDURES/SURGERIES: * No surgery found *    ADMITTING DIAGNOSES & HOSPITAL COURSE:   Jayln Liv is a 89 y.o. female with past medical history of paroxysmal atrial fibrillation, HFpEF, pulmonary edema, hypertension, hyperlipidemia, PE, osteoarthritis, osteoporosis presented to emergency department with chief complaint of shortness of breath, weight gain and mostly right ankle swelling.  Patient was previously hospitalized with similar complaints of shortness of breath from 09/17/2022 to 09/20/2022 with diagnosis of A-fib RVR, heart failure preserved ejection fraction, acute hypoxic respiratory failure secondary to heart failure preserved ejection fraction, acute pulmonary edema, and A-fib with RVR.  During the hospitalization, patient was treated with BiPAP and Lasix IV.  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  Patient is now on anticoagulation due to fall risks.  Patient continues with aforementioned symptoms which remains constant, moderate to severe, without specific alleviating factors.  She denies chest pain.  On arrival in ED today, initial recorded vital signs temperature 97.6 F, BP 105/55, heart rate 157, respiratory rate 16, O2 saturation 99% room air.  12-lead EKG showed A-fib RVR, PVCs, nonspecific intraventricular block, prolonged QTc 539 ms, ST/T wave  changes in the anterior and inferior lateral leads at 130 bpm.  Chest x-ray portable showed resolving pulmonary edema.  Abnormal labs included proBNP 12,324, high-sensitivity troponin 76, hemoglobin 9.9, BUN 45, creatinine 2.03, GFR 23, blood glucose 122, albumin 3.4, ALT 105, AST 63.  ED ordered diltiazem 15 mg IV bolus injection followed by diltiazem continuous IV titrated infusion, Lasix 20 mg IV, magnesium sulfate 2000 mg IV, 0.9% normal saline 250 mL IV fluid bolus x 1, and aspirin 325 mg p.o. x 1 dose.  Patient will is now seen for admission to the hospitalist service.  However prior to arrival at the bedside, nurse notes that patient is now hypotension with SBP in 80s.  Diltiazem IV drip was discontinued.      Atrial fibrillation with rapid ventricular response (HCC)  -now with hypotension  -Diltiazem IV infusion now discontinued due to hypotension  -no Amiodarone ordered due to prolonged Qtc  -initially admitted to IMCU/ telemetry floor  -consider for possible ICU transfer; consult ICU intensivist for evaluation  -consult cardiologist  -considered for Digoxin but not yet ordered due to elevated creatinine  -CHA2 DS2 VASc= 4 (CHF= 1, Age=> 77= 2, sex female = 1)  -Per last discharge summary note, patient on anticoagulation due to falls  -no current cardioversion as patient is not anticoagulated and is currently asymptomatic     2.  Hypotension  -currently patient is asymptomatic while at rest  -no additional IV fluids ordered due to acute pulmonary edema/ CHF  -would like to keep goal MAP > 65     3.  SOB (shortness of breath)  -  place on supplemental oxygen  -continuous pulse oximetry monitoring  -keep goal O2sat > 94%     4.  Acute on chronic HFpEF (heart failure preserved ejection fraction)      Pulmonary edema  -proBNP 12,324  -Given Lasix 20 mg IV x 1 dose  -Tonight no additional diuretics ordered due to hypotension and elevated creatinine  -Place on neurochecks and fall precautions  -Strict I's and O's  and daily weights  -Consider for guideline directed medical therapies which is currently limited due to hypotension and elevated creatinine  -Hold beta-blocker due to hypotension  -Hold ACE I/ARB/ SGLT2i due to elevated creatinine     5.  Edema of lower extremities  -Keep legs elevated at rest  -Plan as above     6.  Acute kidney injury superimposed on chronic kidney disease  -BUN 45, creatinine 2.03, GFR 23 on 10/05/2022  -Creatinine 1.40, GFR 35 on 09/20/2022  -Repeat renal panel in a.m.  -Consult nephrologist  -Order UA with microscopy      7.  Normocytic normochromic anemia  -Hemoglobin 9.9  -Repeat H&H  -Transfuse if hemoglobin less than 7.0     8.  Elevated LFTs  -AST 63, ALT 105  -Repeat CMP in a.m.     9.  Elevated troponin  -troponin = 76  -repeat / trend high sensitivity troponin levels     10. GERD  -continue PPI (Protonix)     11.  History of PE  -not on anticoagulation per prior chart note     12.  Hyperlipidemia  -check lipid panel  -continue statin  -May substitute atorvastatin for lovastatin which is not available on hospital formulary    DISCHARGE DIAGNOSES / PLAN:      Atrial fibrillation with rapid ventricular response  -IV amiodarone transition to p.o.  S/p TEE, no thrombus, underwent cardioversion.  Started on apixaban.  -PO amiodarone 200 BID through 6/12 and then 200 mg PO daily  -Continue Toprol 12.5 mg PO daily  -Eliquis 2.5 mg PO BID  -Goal HR <100bpm  -if >110, recommend evaluation in ED     Acute on chronic HFrEF.  Echocardiogram today showed EF of 20-25%, mild MR, severely dilated LA of note, recent echocardiogram showed a normal EF of 50-55%.  Drop in EF likely due to tachyarrhythmia  -Cardiac GDMT per cardiology but hypotension and CKD, advanced age may be limiting.  -On lasix 40 mg PO daily  -Toprol  -Losartan 12.5mg  PO every evening  -Monitor for weight gain 3 lbs in 2 days or 5 lbs in a week, if so, then contact cardiologist     Hypoxia due to CHF: Supplemental oxygen to keep SpO2  above 92%     Abnormal LFTs, resolved.  -Likely congestive hepatopathy     Hypotension  -Likely induced by tachycardia  -stable      Dyslipidemia  -Continue statin     GERD  -Continue PPI     History of PE  -No longer on anticoagulation due to frequent falls.    -She is now started on apixaban for stroke prevention     CKD stage IV, bump in creatinine could be due to hypoperfusion due to tachyarrhythmia, hypotension.  Creatinine improving.  Monitor.  Avoid nephrotoxic agents.       PENDING TEST RESULTS:   At the time of discharge the following test results are still pending: none    FOLLOW UP APPOINTMENTS:    @DCFOLLOWUP @     ADDITIONAL CARE  RECOMMENDATIONS:   -Monitor daily weights in the morning before breakfast  -Monitor for weight gain 5;bs in 1 week or 3 lbs in 2 days  -Check vitals daily    DIET: cardiac diet, low sodium 2g    ACTIVITY: activity as tolerated    WOUND CARE: none    EQUIPMENT needed: none      DISCHARGE MEDICATIONS:     Medication List        START taking these medications      amiodarone 200 MG tablet  Commonly known as: CORDARONE  Take 1 tablet by mouth 2 times daily for 3 days, THEN 1 tablet daily for 27 days. 1 tablet twice a day (9 am and 9 pm) through 6/12, then 1 tablet daily (9 am) going forward.  Start taking on: October 11, 2022     apixaban 2.5 MG Tabs tablet  Commonly known as: ELIQUIS  Take 1 tablet by mouth 2 times daily     atorvastatin 10 MG tablet  Commonly known as: LIPITOR  Take 1 tablet by mouth daily  Replaces: lovastatin 10 MG tablet     losartan 25 MG tablet  Commonly known as: COZAAR  Take 0.5 tablets by mouth every evening            CHANGE how you take these medications      furosemide 40 MG tablet  Commonly known as: LASIX  Take 1 tablet by mouth daily  What changed:   medication strength  how much to take     metoprolol succinate 25 MG extended release tablet  Commonly known as: TOPROL XL  Take 0.5 tablets by mouth daily  What changed:   how much to take  when to take  this            CONTINUE taking these medications      aspirin 81 MG chewable tablet     empagliflozin 10 MG tablet  Commonly known as: JARDIANCE  Take 1 tablet by mouth daily     pantoprazole 40 MG tablet  Commonly known as: PROTONIX  Take 1 tablet by mouth every morning (before breakfast)     PreserVision AREDS 2 Chew            STOP taking these medications      lovastatin 10 MG tablet  Commonly known as: MEVACOR  Replaced by: atorvastatin 10 MG tablet               Where to Get Your Medications        These medications were sent to [facility] - [place], [place] - [address] - [phone] - [phone]  [address] [place] [address]      Phone: [phone]   amiodarone 200 MG tablet  apixaban 2.5 MG Tabs tablet  atorvastatin 10 MG tablet  furosemide 40 MG tablet  losartan 25 MG tablet  metoprolol succinate 25 MG extended release tablet           NOTIFY YOUR PHYSICIAN FOR ANY OF THE FOLLOWING:   Fever over 101 degrees for 24 hours.   Chest pain, shortness of breath, fever, chills, nausea, vomiting, diarrhea, change in mentation, falling, weakness, bleeding. Severe pain or pain not relieved by medications.  Or, any other signs or symptoms that you may have questions about.    DISPOSITION:    Home With:   OT  PT  [facility]  RN  Long term SNF/Inpatient Rehab   X Independent/assisted living    Hospice    Other:       PATIENT CONDITION AT DISCHARGE:     Functional status    Poor    X Deconditioned     Independent      Cognition    X Lucid     Forgetful     Dementia      Catheters/lines (plus indication)    Foley     PICC     PEG    X None      Code status    X Full code     DNR      PHYSICAL EXAMINATION AT DISCHARGE:    General : alert x 3, awake, no acute distress,   HEENT: PEERL, EOMI, moist mucus membrane  Neck: supple, no JVD, no meningeal signs  Chest: Clear to auscultation bilaterally   CVS: S1 S2 heard, Capillary refill less than 2 seconds  Abd: soft/ Non tender, non distended, BS  physiological,   Ext: no clubbing, no cyanosis, no edema, brisk 2+ DP pulses  Neuro/Psych: pleasant mood and affect, CN 2-12 grossly intact, sensory grossly within normal limit  Skin: warm     CHRONIC MEDICAL DIAGNOSES:      Greater than 31 minutes were spent with the patient on counseling and coordination of care    Signed:   Katrine Coho, MD  10/11/2022  1:32 PM

## 2022-10-11 NOTE — Other (Signed)
Heart Failure Nurse Navigator rounds completed. Met with patient and daughter. Patient anticipating discharge this afternoon.    HF NN provided introduction to self and nurse navigator role. Patient had received [facility] Discharge Packet for Patients Diagnosed with Heart Failure booklet on last admission, along with weight calendar, signs/symptoms magnet, and card with QR codes for HF video resources.     Patient states she has been able to weigh self daily and noticed slight increase in her weight. States that she tries to eat low sodium foods but it has been a challenge.  Daughter states they met with dietician on last admission and were given some low sodium options for her meals.    Advised patient that follow up appointment will be scheduled and placed on Discharge Instructions prior to discharge.        Time spent with patient discussing HF education:  15 min

## 2022-10-11 NOTE — Care Coordination-Inpatient (Addendum)
TOC    The discharge order is in and patient is ready for d/c this date.  CM faxed clinicals to [name] and just waiting on the rapid covid results.  CM spoke w Duwayne Heck at facility [phone], fax: [phone]) and she reports she rec'd everything and just waiting for the rapid covid before they can accept back.  She reports patient has to return by 4pm today for them to accept her.      CM met w patient and daughter, [name] at bedside to discuss the d/c plan w returning to [name] and resuming onsite PT/OT.  Also reviewed an important message from Medicare and patient verbalized understanding and gave permission for possible discharge within 4 hours of receiving IMM.  All questions have been answered and CM wished family well.    Patient has gained weight and the nurse is reaching out to the hospitalist for plan.    Update 1:54pm: Faxed rapid covids results to [name].      Angelica Ran, MSW CCM  Care Management

## 2022-10-12 NOTE — Care Coordination-Inpatient (Signed)
Care Transitions Note  Follow Up Call     [facility] BPCI-A Initial Transitions of Care Call    Patient Current Location:  Home: [address]  [address]  [place] [address]    Patient is enrolled in the [facility] HF bundle, anchor discharge date 09/20/22.  Discharged  to [facility] IL[facility] with resumption home health with [facility] .    BPCI-A CTN contacted the patient, ILF nursing, (Patient provided verbal permission to speak to daughter, Mary Woodward) by telephone to perform post hospital discharge assessment, verified name and DOB as identifiers. Provided introduction to self, explanation of the CTN role, and explanation of the BPCI-A program.     Patient: Mary Woodward   Patient DOB: 1927/07/20   MRN: <[id number]>    Reason for Admission: increased SOB, heart racing  Discharge Date: 10/11/22 RURS: Readmission Risk Score: 19.5       Last Discharge Facility       Date Complaint Diagnosis Description Type Department Provider    10/05/22 Shortness of Breath Acute on chronic congestive heart failure, unspecified heart failure type (HCC) ... ED to Hosp-Admission (Discharged) (ADMITTED) [id number] Simonne Maffucci, Theophilus Kinds, MD; Rosalie Doctor...          Additional needs identified to be addressed with provider   High priority:    CTN reached out to discharging provider, clarification about cardiology documenting PRN dose for diuretic if wt gain 3 lbs above baseline. Response from Dr. Billy Fischer- deferred to cardiology.   CTN reached out to [facility]- return call from [facility]- nurse - she will check with Dr. Dory Peru and fax orders to ILF and [facility], she will ask him about ordering RPM equipment as well. Daughter thinks she will like doing that and not having to walk downstairs.   Clarification about parameters for administering BP and HR meds:   Losartan, toprol and amiodarone.      CTN working with IP CM to secure resumption of care orders for [facility]. HH Office states- awaiting clarification if she will stay with facility based therapy with  [facility]. CTN spoke with daughter, agrees for [facility] to have SN see along with [facility] therapy until discharged, can resume therapy with facility based team.   Left VM for Director of SW at ILF and updated [facility] office.         Method of communication with provider: perfect serve to discharging provider, phone call to cardiology.     Patients top risk factors for readmission: medical condition-Afib-recurrent , medication management, support system, and utilization of services    Interventions to address risk factors:   Education: review, teach back related to Afib and rate control, reducing triggers for HF and AF.   Review of patient management of conditions/medications: discussion with nursing at ILF- checking BP and HR values- coordinating with cardiology to obtain parameters for administering-holding doses. Shared with nursing challenges related to lower values related to rate control meds needed. Asked cardiology to consider ordering RPM.   Home Health: secured orders from IP CM for [facility] for SN, PT and OT.    Communication with providers: cardiology and PCP    Care Summary Note:   received return call from [name], she had slept well last night- "I just woke up- 10 am".  She shared the following- prior to admission she felt increasing SOB- she could tell that her heart was beating fast. Denied LE edema, noted some weight gain but not sure how much. My breathing is much better.  Spoke with nursing at [facility] ALF- Mary Woodward confirms that nursing administers her medications- they do check her BP and HR values, but do not have parameters for administering-holding meds: amiodarone, losartan or toprol.  Mary Woodward comes down to the nursing station and they weigh her in the morning. She states that facility based, [facility] was seeing her for therapy prior to admission, [facility] were coming also.   Return call from daughter, Mary Woodward- ms. Arena gave me permission earlier to talk with her,  I explained I had left her a VM and she would be calling me back. Mary Woodward shared the following:  challenges with getting medications given at scheduled times- she is aware medication technicians administer meds and they have a window of time to administer- 1 hour before or 1 hour after scheduled time- daughter stated on day parent returned to hospital, her medications had not been given and it was 11:30- due at 9 am.   When parent first moved to ILF, she was taking only 3 meds: toprol, cholesterol and pantoprazole- she could easily identify and take on own, no concerns about remembering to take. Later in May facility took over administering meds due to: concern about increased number and due to macular degeneration, parent may not identify all meds she is taking, still no concerns about remembering to take. Shared above yellow box items about diuretic and parameters for BP and HR. Daughter shared that the pharmacy fills meds daily- not weekly or monthly, "if meds are not there in the morning for staff to give- she has not been given an answer about what steps are taken to ensure meds get and are there to administer".      CTN left VM for director of SW to return call.  Return call from Mary Woodward, SW at [facility]. She shared that family has expressed concerns multiple times about medication administration and "changes to routine"- she asked CTN to reach out to [place], team lead nurse to ask about concerns on medication administration- spoke again with Mary Woodward- she states that family had expressed that Mary Woodward not be awakened for medication administration- wait until she awakens. She will follow up with patient and family. Shared that I had heard back from [facility], they would be faxing orders for parameters for meds and if PRN dose of lasix ordered, parameter for administering.  Note- Mary Woodward states that [name] is consider ALF not ILF since they are administering her meds.  Both Duwayne Heck and  Mary Woodward are willing for [facility] to come and can resume facility based therapy at later time. Mary Woodward states she will be talking with Dr. Malen Gauze when he is there tomorrow to see her.     Care Transition Nurse reviewed discharge instructions, medical action plan, and red flags with daughter; briefly discharge instructions and medical action plan with staff at ILF and daily weights with patient.   The patient and daughter and ILF staff were given an opportunity to ask questions; questions regarding: documentation about writing for PRN dose of diuretic- parameters for administering and BP and HR parameters for administering toprol, losartan and amiodarone meds sent to cardiology provider for clarification and clarification of HH orders which include SN, and therapy not facility based therapy to be started after discharge.   The daughter verbalized understanding, demonstrated understanding using teach back method-states she is writing down this information on the discharge instructions, and ILF staff need reinforcement of information discussed.   Were discharge instructions available to patient? Daughter has discharge instructions,  ILF staff state they received discharge orders.   Reviewed appropriate site of care based on symptoms and resources available to patient including: Specialist  After hours contact number-cardiology  Home health  MyChart Messaging. CTN working with the  daughter and ILF staff   to contact the primary care provider and/or specialist office for questions related to their healthcare.      Daily Weight: patient agrees to weigh daily., patient has not weighed. , and provider notified.    Zone:(Pt Reported)  green    Last Echo Results (see below):   10/05/22    TEE W/ POSSIBLE CARDIOVERSION (PRN CONTRAST/BUBBLE/3D) 10/07/2022 10:26 AM (Final)    Interpretation Summary    Left Ventricle: Severely reduced left ventricular systolic function with a visually estimated EF of 20 - 25%. Left ventricle size is  normal. Normal wall thickness. Severe global hypokinesis present.    Aortic Valve: Trileaflet valve.    Mitral Valve: Mild regurgitation.    Tricuspid Valve: Normal RVSP. The estimated RVSP is 30 mmHg.    Left Atrium: Left atrium is severely dilated. Decreased appendage flow velocity. No left atrial appendage thrombus noted.    Right Atrium: Right atrium is mildly dilated.    Image quality is good.    Signed by: Mary Grad, MD on 10/07/2022 10:26 AM    Type of Heart Failure: HFrEF     Cardiac device present: none      Heart Failure Medications: ACE/ARB or ARNI losartan, Betablocker toprol, SGLT2 inhibitor Jardiance , Diuretic furosemide, Anticoagulant Eliquis , and amiodarone       Advance Care Planning:  Does patient have an Advance Directive: deferred at this time, will discuss on future follow up.  and no AMD on file .    Medication Reconciliation:   Medication reconciliation was performed with  nursing at ILF ,1111F entered: N/A.     Remote Patient Monitoring:  Remote Patient Monitoring (RPM) program for in-home monitoring: non [facility] cardiology manages HTN-HF.    Assessments:  Care Transitions 24 Hour Call    Do you have all of your prescriptions and are they filled?: Yes  Have you scheduled your follow up appointment?: Yes  How are you going to get to your appointment?: Car - family or friend to transport  Do you have support at home?: Alone  Care Transitions Interventions         Goals Addressed                   This Visit's Progress     Conditions and Symptoms   Worsening     I will schedule office visits, as directed by my provider.  I will keep my appointment or reschedule if I have to cancel.  I will notify my provider of any barriers to my plan of care.  I will follow my Zone Management tool to seek urgent or emergent care.  I will notify my provider of any symptoms that indicate a worsening of my condition.    Barriers: lack of education and lives in a ILF environment: all meals from dining room, does  not have working scale and BP cuff  Plan for overcoming my barriers: asked her to consider getting scale and BP cuff to monitor values in apartment.   Confidence: 7/10  Anticipated Goal Completion Date: 819/24    10/05/2022  Pt's daughter reports that pt see Dr. Dory Peru for cardiology  Called nurses station and someone is going to check on the  pt.                 Follow Up Appointment:   Discussed follow up appointments. Patient has hospital follow up appointment scheduled within 7 days of discharge.   External Appointments:   PCP- Dr. Renato Battles, sees at ILF- goes there on Wednesdays, will see 6/12.   Cardiology- Dr. Dory Peru- 6/24 at 1 pm     BPCI-A CTN provided contact information.  Plan for follow-up call in 5-7 days based on severity of symptoms and risk factors.  Plan for next call:   Assess current symptoms- wts,  increased dose of furosemide 40 mg daily at discharge, PTA 20 mg daily.  Received order for PRN dose with parameters for administering?   BP and HR values - new amiodarone, losartan and reduced dose of toprol.  Received parameters from cardiology? RPM ordered?   PCP saw on 10/13/22- at ILF.   Status of HH vs. Facility based therapy  Follow up on low NA, hydration  Ask about AMD    [name], RN   Direct:  [phone]

## 2022-10-22 NOTE — Care Coordination-Inpatient (Signed)
Care Transitions Note    Follow Up Call     Patient Current Location:  Home: [address]  [address]  [place] [address]    Care Transition Nurse contacted the patient, caregiver, Judeth Cornfield, nurse at ALF, and daughter, Kellie Simmering, separately  by telephone. Verified name and DOB as identifiers.    Additional needs identified to be addressed with provider   No needs identified           Method of communication with provider: none.    Care Summary Note: spoke initially with Ms. Turkington, she says she is doing okay- feels tired- relates to age and Afib and by blood is low- clarified she says it is DBP. She denies edema, reports no SOB-DOE.  She reports nocturia- up 3 times last night, states "I void a fair amount".  She is receiving home health services- they are checking on me. Reports recent COVID outbreak, have been on "lockdown".   Spoke with nurse, Judeth Cornfield- she gave VS done- voices no concerns- confirms she has received orders for PRN dose of diuretic for wt gain, no doses given.   Spoke later with daughter, [name]- she relays that cardiology office told her if she has no symptoms she can attend Monday's appointment with Dr. Dory Peru.  Daughter confirms that RPM has been ordered, shipped but yet received.      Plan of care updates since last contact:  Review of patient management of conditions/medications: chronic Afib- monitor for s/sx HF/AF. Refer to previous.        Advance Care Planning:   Does patient have an Advance Directive: deferred at this time, will discuss on future follow up.  and no AMD on file .    Medication Review:  Medications changed since last call, reviewed today.     Remote Patient Monitoring:  Offered patient enrollment in the Remote Patient Monitoring (RPM) program for in-home monitoring: non [facility] cardiology- they have ordered RPM- not yet in place.    Assessments:  Care Transitions Subsequent and Final Call    Subsequent and Final Calls  Do you have any ongoing symptoms?: Yes  Onset of  Patient-reported symptoms: Other  Patient-reported symptoms: Other  Have your medications changed?: Yes  Patient Reports: cardiology ordered PRN dose of bumex  Do you have any questions related to your medications?: No  Do you currently have any active services?: Yes  Are you currently active with any services?: Home Health  Do you have any needs or concerns that I can assist you with?: No  Identified Barriers: Other  Care Transitions Interventions  Other Interventions:             Goals Addressed                   This Visit's Progress     Conditions and Symptoms   Improving     I will schedule office visits, as directed by my provider.  I will keep my appointment or reschedule if I have to cancel.  I will notify my provider of any barriers to my plan of care.  I will follow my Zone Management tool to seek urgent or emergent care.  I will notify my provider of any symptoms that indicate a worsening of my condition.    Barriers: lack of education and lives in a ILF environment: all meals from dining room, does not have working scale and BP cuff  Plan for overcoming my barriers: asked her to consider  getting scale and BP cuff to monitor values in apartment.   Confidence: 7/10  Anticipated Goal Completion Date: 819/24    10/05/2022  Pt's daughter reports that pt see Dr. Dory Peru for cardiology  Called nurses station and someone is going to check on the pt.         Self Monitoring        Daily Weights - I will weight myself as directed - Daily and write down weights  Blood Pressure - I will take my blood pressure as directed - Daily  I will notify my provider of any changes in blood pressure associated with symptoms of dizziness, falls, passing out, headache, confusion/change in mental status.    Patient Reported Blood Pressure       10/22/2022    11:03 AM 10/21/2022    11:03 AM 10/20/2022    11:04 AM 10/19/2022    11:04 AM   Patient Reported Blood Pressure   Home Blood Pressure 108/56 106/64 110/64 107/76   BP Comments HR 68  HR 90 HR 82 HR 82   Patient Reported Weight       10/22/2022    11:04 AM 10/21/2022    11:04 AM 10/19/2022    11:05 AM 10/18/2022    11:05 AM   Patient Reported Weights   Weight 130 lb 129 lb 6.4 oz 129 lb 9.6 oz 128 lb 3.2 oz     Barriers: lives in ALF, staff check values, RPM ordered through [facility] but has not received yet.   Plan for overcoming my barriers: daughter and patient agreed, [facility] ordered RPM  Confidence: 10/10  Anticipated Goal Completion Date: 12/19/22                 Follow Up Appointment:   Reviewed upcoming appointment(s). and TOC appointment attended as scheduled   External Appointments:   Cardiology- Dr. Dory Peru- 6/24 at 1 pm.   PCP- Dr. Renato Battles, sees at ALF- saw on 6/14- note in media file. Follow up in 6 months.     Care Transition Nurse provided contact information.   Plan for follow up call in 5-7 days  based on severity of symptoms and risk factors.  Plan for next call:   Assess current symptoms- tired  Wt, BP and HR- ALF staff checking daily but [facility] ordered RPM- received and using?   Attended appt with cardiology on 6/24?   Ask about AMD     [name], RN    Direct:  [phone]

## 2022-11-08 NOTE — Care Coordination-Inpatient (Signed)
Care Transitions Note    Follow Up Call   HF Bundle 5/22-8/18/24    Patient Current Location:  Home: [address]  [address]  [place] [address]    Care Transition Nurse contacted the patient by telephone. Verified name and DOB as identifiers.    Additional needs identified to be addressed with provider   Standard priority:      Saw Cardiology Dr. Dory Peru on 6/24- he changed from furosemide to bumex 1 mg daily, can take one extra tablet PRN for wt above 128-129 lbs. And increased Toprol to 25 mg daily (previously 12.5 mg).      Next follow up on 7/23- 1 mo.          Method of communication with provider: phone call to cardiology, chart routing to PCP.    Care Summary Note: spoke with Mary Woodward, I feel okay. Denies SOB-DOE but states she does tire easily. Was able to go outside and do some walking, even in the heat. I went to [facility] last week, I can watch what I eat there better than I can control what is here in the dining room at [facility] ALF.   Reports recent increase in diuretic due to weight gain- I got up near 60 kg (has received RPM equipment through [facility] for BP, HR and wts).  This morning I weighed 58.9 kg- 129.5 lbs. I am writing down the values.     Plan of care updates since last contact:  Education: discussed strategies to reduce NA intake at ALF dining room- ask for sauces, seasonings on the side or without.    Review of patient management of conditions/medications: continue to use RPM equipment with [facility].  Continue ambulation regularly. Reminder about importance of staying hydrated.      Advance Care Planning:  Does patient have an Advance Directive: deferred at this time, will discuss on future follow up.  and no AMD on file .    Medication Review:    Medications changed since last call, reviewed today.     Remote Patient Monitoring:  Remote Patient Monitoring (RPM) program for in-home monitoring: she is enrolled with RPM with non [facility] cardiology- BP/HR and wts.    Assessments:  Care  Transitions ED Follow Up    Care Transitions Interventions  Do you have any ongoing symptoms?: Yes   Patient-reported symptoms: Other (Comment: tired)   Do you have all of your prescriptions and are they filled?: Yes   Were you discharged with any Home Care or Post Acute Services or do you currently have any active services?: Yes   Post Acute Services: Home Health, Outpatient/Community Services         Do you have any needs or concerns that I can assist you with?: No   Identified Barriers: Other            Goals Addressed                   This Visit's Progress     Conditions and Symptoms   No change     I will schedule office visits, as directed by my provider.  I will keep my appointment or reschedule if I have to cancel.  I will notify my provider of any barriers to my plan of care.  I will follow my Zone Management tool to seek urgent or emergent care.  I will notify my provider of any symptoms that indicate a worsening of my condition.  Barriers: lack of education and lives in a ILF environment: all meals from dining room, does not have working scale and BP cuff  Plan for overcoming my barriers: asked her to consider getting scale and BP cuff to monitor values in apartment.   Confidence: 7/10  Anticipated Goal Completion Date: 819/24    10/05/2022  Pt's daughter reports that pt see Dr. Dory Peru for cardiology  Called nurses station and someone is going to check on the pt.         Self Monitoring   On track     Daily Weights - I will weight myself as directed - Daily and write down weights  Blood Pressure - I will take my blood pressure as directed - Daily  I will notify my provider of any changes in blood pressure associated with symptoms of dizziness, falls, passing out, headache, confusion/change in mental status.    Patient Reported Blood Pressure       10/22/2022    11:03 AM 10/21/2022    11:03 AM 10/20/2022    11:04 AM 10/19/2022    11:04 AM   Patient Reported Blood Pressure   Home Blood Pressure 108/56 106/64  110/64 107/76   BP Comments HR 68 HR 90 HR 82 HR 82   Patient Reported Weight       10/22/2022    11:04 AM 10/21/2022    11:04 AM 10/19/2022    11:05 AM 10/18/2022    11:05 AM   Patient Reported Weights   Weight 130 lb 129 lb 6.4 oz 129 lb 9.6 oz 128 lb 3.2 oz     Barriers: lives in ALF, staff check values, RPM ordered through [facility] but has not received yet.   Plan for overcoming my barriers: daughter and patient agreed, [facility] ordered RPM- received, transmits wt and BP/HR values.   Confidence: 10/10  Anticipated Goal Completion Date: 12/19/22                 Follow Up Appointment:   Reviewed upcoming appointment(s). and TOC appointment attended as scheduled   External Appointments:   PCP Dr. Renato Battles, sees her at ALF.   Cardiology- Dr. Dory Peru- saw on 6/24 - next is 1 mo on 7/23 at 1:15      Care Transition Nurse provided contact information.  Plan for follow-up call in 11-14 days based on severity of symptoms and risk factors.  Plan for next call:   Assess current symptoms- tires easily.    Has RPM to [facility] for wts, BP and HR values  Follow up on dining room able to not season foods, or ask for seasonings/sauces on side? For low NA.   Ask about AMD     [name], RN    Direct:  [phone]

## 2022-11-19 NOTE — Care Coordination-Inpatient (Addendum)
Care Transitions Note    Follow Up Call   HF Bundle 5/20-8/18/24    Attempted to reach patient for transitions of care follow up.  Unable to reach patient.      Outreach Attempts:   Left VM asking for return call.  Sent MyChart message.   Later received return call from Mary Woodward. I just had an episode of Afib for a few days- had to use extra doses of diuretic, feels better now. She confirms she is using RPM equipment from Cardiology- [facility].  Reports she has not made much progress with NA content in meals from the dining room but will continue to try to avoid NA.     Follow Up Appointment:   No future appointments.    External Appointments:   PCP- Mary Woodward, sees at [facility]F  Cardiology- Mary Woodward- 7/23 at 1:15    Plan for follow up call in 6-10 days  based on severity of symptoms and risk factors.   Plan for next call:   Assess symptoms- s/sx related to HF/Afib   Using RPM equipment through [facility]  Progress with reducing NA from meals provided by her ALF dining room  Ask about AMD    [name], RN  Direct:  [phone]

## 2022-11-30 NOTE — Care Coordination-Inpatient (Signed)
Care Transitions Note    Follow Up Call   HF Bundle 5/20-8/18/24    Patient Current Location:  Home: [address]  [address]  [place] [address]    Care Transition Nurse contacted the patient by telephone. Verified name and DOB as identifiers.    Additional needs identified to be addressed with provider   No needs identified         Method of communication with provider: none.    Care Summary Note: spoke with Mary Woodward- she reports doing well over all. Has some days where her "Afib symptoms are affecting her activity level, breathe".    She continues to use RPM equipment through [facility].    Most of the meals from the dining room are not "very good"- I do not eat much of it.     Plan of care updates since last contact:  Review of patient management of conditions/medications: continue to use RPM equipment through [facility] to relay VS.  Encouraged her to reach out to cardiology if she has new, recurrent and/or worsening symptoms including poor response to using PRN dose of diuretic.      Advance Care Planning: Does patient have an Advance Directive: Not on file; patient encouraged to bring existing ACP documents to a [facility] facility.    Spoke with Mary Woodward, she states she does have an AMD in place, she states that CTN can contact Mary Woodward at [facility], she completed the document with her and they have it on file there. Mary Woodward said she gives her permission for Mary Woodward to share with Mary Woodward to place on file in her EMR.    She states that all three children, one daughter, two sons would equally be her decision makers.     Medication Review:   No changes since last call.      Remote Patient Monitoring:   Remote Patient Monitoring (RPM) program for in-home monitoring: Patient is not eligible for RPM program because: she has RPM in place with non [facility] cardiology .    Assessments:  Care Transitions Subsequent and Final Call    Subsequent and Final Calls  Do you have any ongoing symptoms?: Yes  Onset of  Patient-reported symptoms: Other  Have your medications changed?: No  Do you have any questions related to your medications?: No  Do you currently have any active services?: Yes  Are you currently active with any services?: Home Health, Outpatient/Community Services  Do you have any needs or concerns that I can assist you with?: No  Identified Barriers: Other, Lack of Support  Care Transitions Interventions  Other Interventions:             Goals Addressed                   This Visit's Progress     Conditions and Symptoms   On track     I will schedule office visits, as directed by my provider.  I will keep my appointment or reschedule if I have to cancel.  I will notify my provider of any barriers to my plan of care.  I will follow my Zone Management tool to seek urgent or emergent care.  I will notify my provider of any symptoms that indicate a worsening of my condition.    Barriers: lack of education and lives in a ILF environment: all meals from dining room, does not have working scale and BP cuff  Plan for overcoming my barriers:  asked her to consider getting scale and BP cuff to monitor values in apartment.   Confidence: 7/10  Anticipated Goal Completion Date: 819/24    10/05/2022  Pt's daughter reports that pt see Dr. Dory Peru for cardiology  Called nurses station and someone is going to check on the pt.         Self Monitoring   On track     Daily Weights - I will weight myself as directed - Daily and write down weights  Blood Pressure - I will take my blood pressure as directed - Daily  I will notify my provider of any changes in blood pressure associated with symptoms of dizziness, falls, passing out, headache, confusion/change in mental status.    Patient Reported Blood Pressure       11/30/2022    10:26 AM 10/22/2022    11:03 AM 10/21/2022    11:03 AM 10/20/2022    11:04 AM 10/19/2022    11:04 AM   Patient Reported Blood Pressure   Home Blood Pressure 128/81 108/56 106/64 110/64 107/76   BP Comments HR 100- RPM  with [facility] HR 68 HR 90 HR 82 HR 82   Patient Reported Weight       11/30/2022    10:26 AM 10/22/2022    11:04 AM 10/21/2022    11:04 AM 10/19/2022    11:05 AM 10/18/2022    11:05 AM   Patient Reported Weights   Weight 126 lb 14.4 oz 130 lb 129 lb 6.4 oz 129 lb 9.6 oz 128 lb 3.2 oz     Barriers: lives in ALF, staff check values, RPM ordered through [facility] but has not received yet.   Plan for overcoming my barriers: daughter and patient agreed, [facility] ordered RPM- received, transmits wt and BP/HR values.   Confidence: 10/10  Anticipated Goal Completion Date: 12/19/22               Follow Up Appointment:   No future appointments.    External Appointments:   Cardiology Dr. Tommy Medal  PCP- Dr. Renato Battles, he sees her at ALF.     Care Transition Nurse provided contact information.  Plan for follow-up call in 11-14 days based on severity of symptoms and risk factors.  Plan for next call:    Assess current symptoms, intermittent Afib - affects breathing  Status of receiving copy of AMD- asked SW at ALF to send via email to CTN.   Has RPM, through cardiology, [facility].     Mary Singh, RN    Direct:  [phone]

## 2022-11-30 NOTE — ACP (Advance Care Planning) (Signed)
Spoke with Ms. Donaghy, she states she does have an AMD in place, she states that CTN can contact Mirna Mires at [facility], she completed the document with her and they have it on file there. Ms. Mielcarek said she gives her permission for Duwayne Heck to share with Sondra Barges to place on file in her EMR.    She states that all three children, one daughter, two sons would equally be her decision makers.

## 2022-12-10 NOTE — Care Coordination-Inpatient (Signed)
Care Transitions Note    Follow Up Call     Patient Current Location:  Home: [address]  [address]  [place] [address]    Care Transition Nurse contacted the patient by telephone. Verified name and DOB as identifiers.    Additional needs identified to be addressed with provider   No needs identified         Method of communication with provider: none.    Care Summary Note: spoke with Mary Woodward, she was in the middle of playing "skippo with friends" at ALF.  She reports feeling and doing well.      Plan of care updates since last contact:  Review of patient management of conditions/medications: Mary Woodward said she is still doing her best with the food they serve at the ALF to eat low NA; she reports that        Advance Care Planning:   Does patient have an Advance Directive: Not on file; patient encouraged to bring existing ACP documents to a [facility] facility.  CTN had previously left VM asking for SW at [facility] to email document to this CTN, Mary Woodward had asked me to reach out to her to obtain the copy.    Today, I sent MyChart message and provided instructions to upload document and also phone number to contact for assistance with MyChart.     Medication Review:  No changes since last call.     Remote Patient Monitoring:  Remote Patient Monitoring (RPM) program for in-home monitoring: Patient is not eligible for RPM program because: currently enrolled in RPM with non [facility] cardiology .    Assessments:  Care Transitions Subsequent and Final Call    Subsequent and Final Calls  Do you have any ongoing symptoms?: No  Have your medications changed?: No  Do you have any questions related to your medications?: No  Do you currently have any active services?: No  Are you currently active with any services?: Home Health, Outpatient/Community Services  Do you have any needs or concerns that I can assist you with?: No  Identified Barriers: Other, Medication Side Effects  Care Transitions Interventions  Other  Interventions:             Goals Addressed                   This Visit's Progress     Conditions and Symptoms   On track     I will schedule office visits, as directed by my provider.  I will keep my appointment or reschedule if I have to cancel.  I will notify my provider of any barriers to my plan of care.  I will follow my Zone Management tool to seek urgent or emergent care.  I will notify my provider of any symptoms that indicate a worsening of my condition.    Barriers: lack of education and lives in a ILF environment: all meals from dining room, does not have working scale and BP cuff  Plan for overcoming my barriers: asked her to consider getting scale and BP cuff to monitor values in apartment.   Confidence: 7/10  Anticipated Goal Completion Date: 819/24    10/05/2022  Pt's daughter reports that pt see Dr. Dory Peru for cardiology  Called nurses station and someone is going to check on the pt.         Self Monitoring   On track     Daily Weights - I will weight myself as  directed - Daily and write down weights  Blood Pressure - I will take my blood pressure as directed - Daily  I will notify my provider of any changes in blood pressure associated with symptoms of dizziness, falls, passing out, headache, confusion/change in mental status.    Patient Reported Blood Pressure       11/30/2022    10:26 AM 10/22/2022    11:03 AM 10/21/2022    11:03 AM 10/20/2022    11:04 AM 10/19/2022    11:04 AM   Patient Reported Blood Pressure   Home Blood Pressure 128/81 108/56 106/64 110/64 107/76   BP Comments HR 100- RPM with [facility] HR 68 HR 90 HR 82 HR 82   Patient Reported Weight       11/30/2022    10:26 AM 10/22/2022    11:04 AM 10/21/2022    11:04 AM 10/19/2022    11:05 AM 10/18/2022    11:05 AM   Patient Reported Weights   Weight 126 lb 14.4 oz 130 lb 129 lb 6.4 oz 129 lb 9.6 oz 128 lb 3.2 oz     Barriers: lives in ALF, staff check values, RPM ordered through [facility] but has not received yet.   Plan for overcoming my barriers: daughter  and patient agreed, [facility] ordered RPM- received, transmits wt and BP/HR values.   Confidence: 10/10  Anticipated Goal Completion Date: 12/19/22                Follow Up Appointment:   External Appointments:   PCP- Dr. Renato Battles, sees her at ALF.   Cardiology- Dr. Dory Peru- saw on 7/23.      Care Transition Nurse provided contact information.  Plan for follow-up call in 6-10 days based on severity of symptoms and risk factors.  Plan for next call:   Assess current symptoms- int Afib- affects breathing, energy.   Has RPM with [facility].   Any extra doses of diuretic?   Follow up on getting copy of AMD.    Resolves on 12/19/22.      Mary Singh, RN    Direct:  [phone]

## 2022-12-14 NOTE — Care Coordination-Inpatient (Signed)
Care Transitions Note    Follow Up Call     Patient Current Location:  Home: [address]  [address]  [place] [address]    Care Transition Nurse contacted the patient by telephone. Verified name and DOB as identifiers.    Additional needs identified to be addressed with provider   No needs identified         Method of communication with provider: none.    Care Summary Note: received return call from Ms. Findling, she reports feeling and doing well.      Plan of care updates since last contact:  Review of patient management of conditions/medications: she continues to use RPM equipment through her cardiologist office. She continues to look at meals provided in dining room and adjust portion size to help with reducing NA intake.  Meals on her own- breakfast and some lunches are easier for her to control NA.   Received and uploaded AMD from her daughter.        Advance Care Planning:   Does patient have an Advance Directive:  received via email from daughter, uploaded into EMR .    Medication Review:   No changes since last call.     Remote Patient Monitoring:   Remote Patient Monitoring (RPM) program for in-home monitoring: Patient is not eligible for RPM program because: has through non [facility] cardiology .    Assessments:  Care Transitions Subsequent and Final Call    Subsequent and Final Calls  Care Transitions Interventions  Other Interventions:             Goals Addressed                   This Visit's Progress     Conditions and Symptoms   On track     I will schedule office visits, as directed by my provider.  I will keep my appointment or reschedule if I have to cancel.  I will notify my provider of any barriers to my plan of care.  I will follow my Zone Management tool to seek urgent or emergent care.  I will notify my provider of any symptoms that indicate a worsening of my condition.    Barriers: lack of education and lives in a ILF environment: all meals from dining room, does not have working scale and  BP cuff  Plan for overcoming my barriers: asked her to consider getting scale and BP cuff to monitor values in apartment.   Confidence: 7/10  Anticipated Goal Completion Date: 819/24    10/05/2022  Pt's daughter reports that pt see Dr. Dory Peru for cardiology  Called nurses station and someone is going to check on the pt.         Self Monitoring   On track     Daily Weights - I will weight myself as directed - Daily and write down weights  Blood Pressure - I will take my blood pressure as directed - Daily  I will notify my provider of any changes in blood pressure associated with symptoms of dizziness, falls, passing out, headache, confusion/change in mental status.    Patient Reported Blood Pressure       11/30/2022    10:26 AM 10/22/2022    11:03 AM 10/21/2022    11:03 AM 10/20/2022    11:04 AM 10/19/2022    11:04 AM   Patient Reported Blood Pressure   Home Blood Pressure 128/81 108/56 106/64 110/64 107/76   BP Comments  HR 100- RPM with [facility] HR 68 HR 90 HR 82 HR 82     Patient Reported Weight       11/30/2022    10:26 AM 10/22/2022    11:04 AM 10/21/2022    11:04 AM 10/19/2022    11:05 AM 10/18/2022    11:05 AM   Patient Reported Weights   Weight 126 lb 14.4 oz 130 lb 129 lb 6.4 oz 129 lb 9.6 oz 128 lb 3.2 oz       Barriers: lives in ALF, staff check values, RPM ordered through [facility] but has not received yet.   Plan for overcoming my barriers: daughter and patient agreed, [facility] ordered RPM- received, transmits wt and BP/HR values.   Confidence: 10/10  Anticipated Goal Completion Date: 12/19/22               Follow Up Appointment:   Reviewed upcoming appointment(s).  External Appointments:   PCP- Dr. Renato Battles- sees her at [facility]  Cardiology- Dr. Tommy Medal-     Care Transition Nurse provided contact information.  Plan for follow-up call in 6-10 days based on severity of symptoms and risk factors.  Plan for next call:   Assess current symptoms- will experience intermittent Afib- SOB.   Has RPM with [facility] in place for BP and  HR, wts  Progress with low NA- especially meals from dining room.     [name], RN    Direct:  [phone]

## 2022-12-20 NOTE — Care Coordination-Inpatient (Signed)
Care Transitions Note    Final Call   HF Bundle 5/21-8/18/24    Patient Current Location:  Home: [address]  [address]  [place] [address]    Care Transition Nurse contacted the patient by telephone. Verified name and DOB as identifiers.    Patient graduated from the Bundle  program on 12/19/22.  Patient/family progressing towards self management.  reinforced resources provided during this care transition period..      Advance Care Planning:   Does patient have an Advance Directive: reviewed and current.    Handoff:   Patient was not referred to the [facility] team due to patient not affiliated with [facility].       Care Summary Note: spoke with Mary Woodward, she states that she is not feeling well today.  She shared that "my BP is up, they called the cardiology office but I do not think anyone has called back yet".  CTN asked if there was anything I could do, she responded, "not right now".  CTN asked if she had recent wt. Gain and if so, were ALF staff administering PRN dose of furosemide, etc., she responded that "it is my Afib".    Explained end of BPCI period as of 12/19/22. CTN encouraged her to continue to reach out to providers directly to share concerns, needs, etc., in addition to the ALF staff calling.     Assessments:  Care Transitions Subsequent and Final Call    Subsequent and Final Calls  Do you currently have any active services?: Yes  Are you currently active with any services?: Home Health, Outpatient/Community Services  Identified Barriers: Other  Care Transitions Interventions  Other Interventions:             Goals Addressed                   This Visit's Progress     COMPLETED: Conditions and Symptoms        I will schedule office visits, as directed by my provider.  I will keep my appointment or reschedule if I have to cancel.  I will notify my provider of any barriers to my plan of care.  I will follow my Zone Management tool to seek urgent or emergent care.  I will notify my provider of any symptoms  that indicate a worsening of my condition.    Barriers: lack of education and lives in a ILF environment: all meals from dining room, does not have working scale and BP cuff  Plan for overcoming my barriers: asked her to consider getting scale and BP cuff to monitor values in apartment.   Confidence: 7/10  Anticipated Goal Completion Date: 819/24    10/05/2022  Pt's daughter reports that pt see Dr. Dory Peru for cardiology  Called nurses station and someone is going to check on the pt.         COMPLETED: Self Monitoring        Daily Weights - I will weight myself as directed - Daily and write down weights  Blood Pressure - I will take my blood pressure as directed - Daily  I will notify my provider of any changes in blood pressure associated with symptoms of dizziness, falls, passing out, headache, confusion/change in mental status.    Patient Reported Blood Pressure       11/30/2022    10:26 AM 10/22/2022    11:03 AM 10/21/2022    11:03 AM 10/20/2022    11:04 AM 10/19/2022  11:04 AM   Patient Reported Blood Pressure   Home Blood Pressure 128/81 108/56 106/64 110/64 107/76   BP Comments HR 100- RPM with [facility] HR 68 HR 90 HR 82 HR 82     Patient Reported Weight       11/30/2022    10:26 AM 10/22/2022    11:04 AM 10/21/2022    11:04 AM 10/19/2022    11:05 AM 10/18/2022    11:05 AM   Patient Reported Weights   Weight 126 lb 14.4 oz 130 lb 129 lb 6.4 oz 129 lb 9.6 oz 128 lb 3.2 oz       Barriers: lives in ALF, staff check values, RPM ordered through [facility] but has not received yet.   Plan for overcoming my barriers: daughter and patient agreed, [facility] ordered RPM- received, transmits wt and BP/HR values.   Confidence: 10/10  Anticipated Goal Completion Date: 12/19/22               Upcoming Appointments:    PCP- Dr. Renato Battles sees her at ALF.    Cardiology- Dr. Dory Peru-      Patient has agreed to contact primary care provider and/or specialist for any further questions, concerns, or needs.    Mary Singh, RN    Direct:  [phone]

## 2023-05-09 NOTE — Telephone Encounter (Signed)
 On-call page:    [name] calling regarding dosing on bumex. Patient was seen by Urology Dr. Szobota but dosing of bumex Rx was unclear. Advised them to call Dr. Deneen office as we are unable to see his note in our EMR or in Care Everywhere.     Mary SHAUNNA Coder, MD

## 2023-05-18 NOTE — Progress Notes (Signed)
Subjective:   Patient ID: Mary Woodward is a 89 y.o. female.    Chief Complaint: Pain of the Left Knee and Pain of the Right Knee  Patient is here for follow-up evaluation associate with bilateral knee pain symptoms.  The patient at previous had bilateral knee cortisone injections in the May timeframe.  She notes that she has been doing quite well with worsening of symptoms of bilateral knees over the course of the last several months.  The daughter notes that she has been less active subsequent to the knees in regards to pain symptoms.  Denies any falls, traumas or injuries.  She gets around predominantly with a rolling walker which she notes has been using for about a year and a half.  She resides at the [facility] she notes that she does do a fair amount of walking to get to and from her room and other areas around the building which she is able to do.    Review of Systems   05/18/2023    Constitutional: Unexplained: Negative  Genitourinary: Frequent Urination: Negative  HEENT: Vision Loss: Negative  Neurological: Memory Loss: Negative  Integumentary: Rash: Negative  Cardiovascular: Palpatations: Negative  Hematologic: Bruises/Bleeds Easily: Negative  Gastrointestinal: Constipation: Negative  Immunological: Seasonal Allergies: Negative  Musculoskeletal: Joint Pain: Positive    Past Medical History:   Diagnosis Date   . Hyperlipidemia        Past Surgical History:   Procedure Laterality Date   . HIP SURGERY Left    . HYSTERECTOMY     . JOINT REPLACEMENT         Objective:     Ht 5'   Wt 130 lb   BMI 25.39 kg/m     Constitutional:  No acute distress. Well nourished. Well developed.  Eyes:  Sclera are nonicteric.  Respiratory:  No labored breathing.  Cardiovascular:  No marked edema.  Skin:  No marked skin ulcers.  Neurological:  No marked sensory loss noted.  Psychiatric: Alert and oriented x3.  Musculoskeletal     Phyiscal Exam:   On examination of bilateral knees there is no obvious skin abnormalities nor  fomites noted.  No ecchymosis erythema or effusion noted body of the knee.  Most significant point tenderness medial and patellofemoral joint spaces mild to moderate crepitus noted.  Good strength with all extensor mechanism.  Tolerates weightbearing appropriately.  No swelling or edema noted.  Grossly intact sensation to light touch.  Normal capillary refill.  Intact neurovascular distally.    Procedures:      Large Joint Arthrocentesis: L knee  Performed by: Romeo Rabon, PA-C  Authorized by: Romeo Rabon, PA-C      Consent given by: patient  Site marked: site marked  Timeout: Immediately prior to procedure a time out was called to verify the correct patient, procedure, equipment, support staff and site/side marked as required   Supporting Documentation  Indications: pain   Procedure Details  Location:  Knee L knee   Preparation: Patient was prepped and draped in the usual sterile fashion, ethylchloride used.  Needle size: 22 G    Approach: anterolateral  Patient tolerance: patient tolerated the procedure well with no immediate complications    Medications administered: 1 mL bupivacaine 0.5 %; 1 mL depo-medrol 40 MG/ML    Patient Education: Diabetes Risk Discussed and Cortisone Flare Risk Discussed      Large Joint Arthrocentesis: R knee  Performed by: Romeo Rabon, PA-C  Authorized by: Romeo Rabon, PA-C  Consent given by: patient  Site marked: site marked  Timeout: Immediately prior to procedure a time out was called to verify the correct patient, procedure, equipment, support staff and site/side marked as required   Supporting Documentation  Indications: pain   Procedure Details  Location:  Knee R knee   Preparation: Patient was prepped and draped in the usual sterile fashion, ethylchloride used.  Needle size: 22 G    Approach: anterolateral  Patient tolerance: patient tolerated the procedure well with no immediate complications    Medications administered: 2 mL bupivacaine 0.5 %; 1 mL  depo-medrol 40 MG/ML    Patient Education: Diabetes Risk Discussed and Cortisone Flare Risk Discussed        Radiographs:             No imaging obtained      Assessment:     1. Primary osteoarthritis of both knees         Plan:   Discussed the patient this point she does have severe degenerative arthritis of bilateral knees she does have some degree of valgus deformity noted about both knees.  Sounds like the steroid injections are beneficial for approximately 56-month duration.  Once again we did go over expectations and the likeliness of efficacy declining of injections over time.  We did mention the possibility of viscosupplementation injections in the future.  At this point based on her current symptoms we will proceed forth with bilateral cortisone injections today, patient tolerated the procedures well.  She will follow back up with me in 6 weeks discussed the possibility of viscosupplementation gel intervention.  All question concerns addressed on today's visit.    Orders Placed This Encounter   . Large Joint Arthrocentesis: L knee   . Large Joint Arthrocentesis: R knee   . BMI Patient Education      Return in about 6 weeks (around 06/29/2023) for Follow Up.     Asher Muir, PA-C  Supervising Physician: Valentino Saxon, MD    Note: This chart was prepared using voice-recognition software and may contain unintended word substitution errors. An addendum for corrections can be made upon request.

## 2023-06-05 ENCOUNTER — Emergency Department: Admit: 2023-06-06 | Payer: MEDICARE | Primary: Internal Medicine

## 2023-06-05 ENCOUNTER — Emergency Department: Payer: MEDICARE | Primary: Internal Medicine

## 2023-06-05 DIAGNOSIS — A4151 Sepsis due to Escherichia coli [E. coli]: Principal | ICD-10-CM

## 2023-06-05 DIAGNOSIS — A419 Sepsis, unspecified organism: Secondary | ICD-10-CM

## 2023-06-05 NOTE — ED Notes (Signed)
Report given to Eileen Stanford, RN using ED SBAR.

## 2023-06-05 NOTE — ED Provider Notes (Signed)
 [facility]  EMERGENCY DEPARTMENT ENCOUNTER      Pt Name: Mary Woodward  MRN: [id number]  Birthdate 1927-11-07  Date of evaluation: 06/05/2023  Provider: Reita Cliche, MD    CHIEF COMPLAINT       Chief Complaint   Patient presents with    Vomiting         HISTORY OF PRESENT ILLNESS   (Location/Symptom, Timing/Onset, Context/Setting, Quality, Duration, Modifying Factors, Severity)  Note limiting factors.   Mary Woodward is a 89 y.o. female who presents to the emergency department      The history is provided by the patient and a relative. No language interpreter was used.   Abdominal Pain  Pain location:  Generalized  Pain quality: dull    Pain radiates to:  Does not radiate  Pain severity:  Mild  Onset quality:  Sudden  Timing:  Constant  Progression:  Unchanged  Chronicity:  New  Ineffective treatments:  None tried  Associated symptoms: constipation and nausea    Associated symptoms: no chest pain, no chills, no diarrhea, no dysuria, no fever, no hematuria, no shortness of breath and no vomiting        Nursing Notes were reviewed.    REVIEW OF SYSTEMS    (2-9 systems for level 4, 10 or more for level 5)     Review of Systems   Constitutional:  Negative for activity change, chills and fever.   HENT:  Negative for nosebleeds.    Eyes:  Negative for visual disturbance.   Respiratory:  Negative for shortness of breath.    Cardiovascular:  Negative for chest pain and palpitations.   Gastrointestinal:  Positive for abdominal pain, constipation and nausea. Negative for diarrhea and vomiting.   Genitourinary:  Positive for pelvic pain. Negative for difficulty urinating, dysuria, hematuria and urgency.   Musculoskeletal:  Negative for back pain, neck pain and neck stiffness.   Skin:  Negative for color change.   Allergic/Immunologic: Negative for immunocompromised state.   Neurological:  Negative for dizziness, seizures, syncope, weakness, light-headedness, numbness and headaches.    Psychiatric/Behavioral:  Negative for behavioral problems, confusion, hallucinations, self-injury and suicidal ideas.        Except as noted above the remainder of the review of systems was reviewed and negative.       PAST MEDICAL HISTORY   No past medical history on file.      SURGICAL HISTORY     No past surgical history on file.      CURRENT MEDICATIONS       Previous Medications    AMIODARONE (CORDARONE) 200 MG TABLET    Take 1 tablet by mouth 2 times daily for 3 days, THEN 1 tablet daily for 27 days. 1 tablet twice a day (9 am and 9 pm) through 6/12, then 1 tablet daily (9 am) going forward.    APIXABAN (ELIQUIS) 2.5 MG TABS TABLET    Take 1 tablet by mouth 2 times daily    ASPIRIN 81 MG CHEWABLE TABLET    Take 1 tablet by mouth daily    ATORVASTATIN (LIPITOR) 10 MG TABLET    Take 1 tablet by mouth daily    EMPAGLIFLOZIN (JARDIANCE) 10 MG TABLET    Take 1 tablet by mouth daily    FUROSEMIDE (LASIX) 40 MG TABLET    Take 1 tablet by mouth daily    LOSARTAN (COZAAR) 25 MG TABLET    Take 0.5 tablets by mouth every evening  METOPROLOL SUCCINATE (TOPROL XL) 25 MG EXTENDED RELEASE TABLET    Take 0.5 tablets by mouth daily    MULTIPLE VITAMINS-MINERALS (PRESERVISION AREDS 2) CHEW    Take 1 tablet by mouth in the morning and at bedtime    PANTOPRAZOLE (PROTONIX) 40 MG TABLET    Take 1 tablet by mouth every morning (before breakfast)       ALLERGIES     Patient has no known allergies.    FAMILY HISTORY     No family history on file.       SOCIAL HISTORY       Social History     Socioeconomic History    Marital status: Widowed   Tobacco Use    Smoking status: Former     Types: Cigarettes     Passive exposure: Past (stopped about 60 years ago)    Smokeless tobacco: Never   Substance and Sexual Activity    Alcohol use: Yes     Alcohol/week: 2.0 standard drinks of alcohol     Types: 2 Glasses of wine per week    Drug use: Never     Social Determinants of Health     Food Insecurity: No Food Insecurity (10/06/2022)     Hunger Vital Sign     Worried About Running Out of Food in the Last Year: Never true     Ran Out of Food in the Last Year: Never true   Transportation Needs: No Transportation Needs (10/06/2022)    PRAPARE - Therapist, art (Medical): No     Lack of Transportation (Non-Medical): No   Housing Stability: Low Risk  (10/06/2022)    Housing Stability Vital Sign     Unable to Pay for Housing in the Last Year: No     Number of Places Lived in the Last Year: 2     Unstable Housing in the Last Year: No       SCREENINGS         Glasgow Coma Scale  Eye Opening: Spontaneous  Best Verbal Response: Oriented  Best Motor Response: Obeys commands  Glasgow Coma Scale Score: 15                     CIWA Assessment  BP: (!) 99/51  Pulse: 61                 PHYSICAL EXAM    (up to 7 for level 4, 8 or more for level 5)     ED Triage Vitals [06/05/23 2015]   BP Systolic BP Percentile Diastolic BP Percentile Temp Temp Source Pulse Respirations SpO2   (!) 99/51 -- -- 97.6 F (36.4 C) Oral 61 18 94 %      Height Weight         1.524 m (5') --             Physical Exam  Vitals and nursing note reviewed.   Constitutional:       General: She is not in acute distress.     Appearance: Normal appearance. She is ill-appearing. She is not toxic-appearing or diaphoretic.   HENT:      Head: Normocephalic and atraumatic.      Nose: Nose normal.      Mouth/Throat:      Mouth: Mucous membranes are moist.   Eyes:      Extraocular Movements: Extraocular movements intact.      Conjunctiva/sclera: Conjunctivae normal.  Pupils: Pupils are equal, round, and reactive to light.   Cardiovascular:      Rate and Rhythm: Normal rate and regular rhythm.      Comments: Warm and well perfused  Pulmonary:      Effort: Pulmonary effort is normal.   Abdominal:      General: Abdomen is flat.      Palpations: Abdomen is soft.      Tenderness: There is no abdominal tenderness. There is no guarding or rebound.   Musculoskeletal:         General:  Normal range of motion.      Cervical back: Normal range of motion.   Skin:     General: Skin is warm and dry.   Neurological:      General: No focal deficit present.      Mental Status: She is alert and oriented to person, place, and time.   Psychiatric:         Mood and Affect: Mood normal.         Behavior: Behavior normal.         Thought Content: Thought content normal.         Judgment: Judgment normal.         DIAGNOSTIC RESULTS     EKG: All EKG's are interpreted by the Emergency Department Physician who either signs or Co-signs this chart in the absence of a cardiologist.        RADIOLOGY:   Non-plain film images such as CT, Ultrasound and MRI are read by the radiologist. Plain radiographic images are visualized and preliminarily interpreted by the emergency physician with the below findings:        Interpretation per the Radiologist below, if available at the time of this note:    CT ABDOMEN PELVIS W IV CONTRAST Additional Contrast? None    (Results Pending)         ED BEDSIDE ULTRASOUND:   Performed by ED Physician - none    LABS:  Labs Reviewed   EXTRA TUBES HOLD   COMPREHENSIVE METABOLIC PANEL   CBC WITH AUTO DIFFERENTIAL   AMMONIA   URINALYSIS WITH REFLEX TO CULTURE       All other labs were within normal range or not returned as of this dictation.    EMERGENCY DEPARTMENT COURSE and DIFFERENTIAL DIAGNOSIS/MDM:   Vitals:    Vitals:    06/05/23 2015   BP: (!) 99/51   Pulse: 61   Resp: 18   Temp: 97.6 F (36.4 C)   TempSrc: Oral   SpO2: 94%   Height: 1.524 m (5')           Medical Decision Making  Amount and/or Complexity of Data Reviewed  Labs: ordered.  Radiology: ordered.  ECG/medicine tests: ordered.    Risk  Prescription drug management.  Decision regarding hospitalization.    This is a 89 year old female with past medical history, review of systems, physical exam as above, presenting with complaints of abdominal pain, nausea, vomiting.  Patient states her last bowel movement was approximately 1 week  ago, nausea and vomiting began today.  Family at bedside states recently evaluated at another hospital for ground-level fall with reported pelvic fracture.  Patient notes previous history of appendectomy, family states she has been weak, likely dehydrated as she is having difficulty taking p.o.  They note she was identified as being anemic during her hospitalization.  Pressures are noted to be borderline on arrival she is, afebrile without tachycardia,  satting well on room air.  She is awake and alert, ill-appearing though in no acute distress, with regular rate and rhythm, abdomen is soft without inducible tenderness.  Differential including viral gastroenteritis, worsening anemia, dehydration, AKI.  Discussed with patient and family fluid resuscitation, CMP, CBC, UA, lipase, CT scan of the abdomen and pelvis.  We will reassess, and make a disposition.        REASSESSMENT     ED Course as of 06/05/23 2345   Sun Jun 05, 2023   2139 EKG interpretation: (Preliminary)  Rhythm: normal sinus rhythm; and regular . Rate (approx.): 63; Axis: normal; P wave: normal; QRS interval: normal ; ST/T wave: non-specific changes;LBBB; Other findings: abnormal ekg   [FD]      ED Course User Index  [FD] Cleatis Fandrich, Marye Round, MD     Perfect Serve Consult for Admission  11:45 PM    ED Room Number: ER11/11  Patient Name and age:  Amilia Vandenbrink 89 y.o.  female  Working Diagnosis:   1. Sepsis without acute organ dysfunction, due to unspecified organism (HCC)    2. Acute cystitis with hematuria    3. AKI (acute kidney injury) (HCC)    4. Elevated LFTs        COVID-19 Suspicion: No  Sepsis present:  Yes  Reassessment needed: Yes  Code Status:  Full Code  Readmission: No  Isolation Requirements: no  Recommended Level of Care: step down  Department: [facility] ED - [phone]  Consulting Provider: d/w Dr. Darnelle Spangle        Total critical care time spent exclusive of procedures:  89 minutes.           CONSULTS:  None    PROCEDURES:  Unless  otherwise noted below, none     Procedures        FINAL IMPRESSION    No diagnosis found.      DISPOSITION/PLAN   DISPOSITION        PATIENT REFERRED TO:  No follow-up provider specified.    DISCHARGE MEDICATIONS:  New Prescriptions    No medications on file     Controlled Substances Monitoring:          No data to display                (Please note that portions of this note were completed with a voice recognition program.  Efforts were made to edit the dictations but occasionally words are mis-transcribed.)    Reita Cliche, MD (electronically signed)  Attending Emergency Physician           Nicholaos Schippers, Marye Round, MD  06/05/23 [phone]

## 2023-06-05 NOTE — H&P (Incomplete)
History & Physical    Primary Care Provider: Darrell Jewel, MD  Source of Information: Patient and chart review    History of Presenting Illness:   Mary Woodward is a 89 y.o. female with with pmh of paroxysmal atrial fibrillation, HFpEF, pulmonary edema, hypertension, hyperlipidemia, PE, osteoarthritis, osteoporosis who presented to ed with complaints of abdominal pain, nausea and vomiting.    The patient denies any fever, chills, chest or abdominal pain, nausea, vomiting, cough, congestion, recent illness, palpitations, or dysuria.    Remarkable vitals on ER Presentation: ***  Labs Remarkable for: ***  ER Images: ***  ER Rx: ***     Review of Systems:  {Ros - complete:[id number]}     No past medical history on file.   No past surgical history on file.  Prior to Admission medications    Medication Sig Start Date End Date Taking? Authorizing Provider   amiodarone (CORDARONE) 200 MG tablet Take 1 tablet by mouth 2 times daily for 3 days, THEN 1 tablet daily for 27 days. 1 tablet twice a day (9 am and 9 pm) through 6/12, then 1 tablet daily (9 am) going forward. 10/11/22 11/10/22  Simonne Maffucci, Theophilus Kinds, MD   apixaban Everlene Balls) 2.5 MG TABS tablet Take 1 tablet by mouth 2 times daily 10/11/22   Simonne Maffucci, Theophilus Kinds, MD   losartan (COZAAR) 25 MG tablet Take 0.5 tablets by mouth every evening 10/11/22   Simonne Maffucci, Theophilus Kinds, MD   metoprolol succinate (TOPROL XL) 25 MG extended release tablet Take 0.5 tablets by mouth daily 10/11/22   Simonne Maffucci, Theophilus Kinds, MD   furosemide (LASIX) 40 MG tablet Take 1 tablet by mouth daily 10/11/22   Simonne Maffucci, Theophilus Kinds, MD   atorvastatin (LIPITOR) 10 MG tablet Take 1 tablet by mouth daily 10/11/22   Simonne Maffucci, Theophilus Kinds, MD   empagliflozin (JARDIANCE) 10 MG tablet Take 1 tablet by mouth daily 09/21/22   Ronnie Derby, MD   pantoprazole (PROTONIX) 40 MG tablet Take 1 tablet by mouth every morning (before breakfast) 09/21/22   Ronnie Derby, MD   aspirin 81 MG chewable tablet Take 1  tablet by mouth daily    [provider]   Multiple Vitamins-Minerals (PRESERVISION AREDS 2) CHEW Take 1 tablet by mouth in the morning and at bedtime    [provider]     No Known Allergies   No family history on file.     SOCIAL HISTORY:  Patient resides:  Independently ***   Assisted Living    SNF    With family care       Smoking history:   None ***   Former    Chronic      Alcohol history:   None ***   Social    Chronic      Ambulates:   Independently ***   w/cane    w/walker    w/wc    CODE STATUS:  DNR    Full ***   Other      Objective:     Physical Exam:     BP 132/89   Pulse 74   Temp 98.3 F (36.8 C) (Oral)   Resp 24   Ht 1.524 m (5')   Wt 67 kg (147 lb 11.3 oz)   SpO2 91%   BMI 28.85 kg/m         General:  Alert, cooperative, no distress, appears stated age.   Head:  Normocephalic, without  obvious abnormality, atraumatic.   Eyes:  Conjunctivae/corneas clear. PERRL, EOMs intact.   Nose: Nares normal. Septum midline. Mucosa normal.        Neck: Supple, symmetrical, trachea midline.       Lungs:   Clear to auscultation bilaterally.   Chest wall:  No tenderness or deformity.   Heart:  Regular rate and rhythm, S1, S2 normal   Abdomen:   Soft, non-tender. Bowel sounds normal. No masses,  No organomegaly.   Extremities: Extremities normal, atraumatic, no cyanosis or edema.   Pulses: 2+ and symmetric all extremities.   Skin: Skin color, texture, turgor normal. No rashes or lesions   Neurologic: CNII-XII grossly intact.          EKG:  {ekg findings:315101::"normal EKG, normal sinus rhythm","unchanged from previous tracings"}.      Data Review:     Recent Days:  Recent Labs     06/05/23  2121   WBC 25.6*   HGB 8.6*   HCT 27.2*   PLT 341     Recent Labs     06/05/23  2121   NA 128*   K 4.8   CL 100   CO2 21   BUN 47*   ALT 909*     No results for input(s): "PH", "PCO2", "PO2", "HCO3", "FIO2" in the last 72 hours.    24 Hour Results:  Recent Results (from the past 24 hour(s))   EKG 12  Lead    Collection Time: 06/05/23  9:13 PM   Result Value Ref Range    Ventricular Rate 63 BPM    Atrial Rate 63 BPM    P-R Interval 234 ms    QRS Duration 160 ms    Q-T Interval 502 ms    QTc Calculation (Bazett) 513 ms    R Axis -20 degrees    T Axis 122 degrees    Diagnosis       Sinus rhythm with 1st degree AV block  Left bundle branch block  Abnormal ECG  When compared with ECG of 07-Oct-2022 10:22,  PR interval has increased  Questionable change in QRS axis     Comprehensive Metabolic Panel    Collection Time: 06/05/23  9:21 PM   Result Value Ref Range    Sodium 128 (L) 136 - 145 mmol/L    Potassium 4.8 3.5 - 5.1 mmol/L    Chloride 100 97 - 108 mmol/L    CO2 21 21 - 32 mmol/L    Anion Gap 7 2 - 12 mmol/L    Glucose 112 (H) 65 - 100 mg/dL    BUN 47 (H) 6 - 20 MG/DL    Creatinine 1.61 (H) 0.55 - 1.02 MG/DL    BUN/Creatinine Ratio 17 12 - 20      Est, Glom Filt Rate 16 (L) >60 ml/min/1.11m2    Calcium 8.4 (L) 8.5 - 10.1 MG/DL    Total Bilirubin 2.1 (H) 0.2 - 1.0 MG/DL    ALT 096 (H) 12 - 78 U/L    AST 926 (H) 15 - 37 U/L    Alk Phosphatase 216 (H) 45 - 117 U/L    Total Protein 6.6 6.4 - 8.2 g/dL    Albumin 2.6 (L) 3.5 - 5.0 g/dL    Globulin 4.0 2.0 - 4.0 g/dL    Albumin/Globulin Ratio 0.7 (L) 1.1 - 2.2     CBC with Auto Differential    Collection Time: 06/05/23  9:21 PM   Result Value  Ref Range    WBC 25.6 (H) 3.6 - 11.0 K/uL    RBC 3.27 (L) 3.80 - 5.20 M/uL    Hemoglobin 8.6 (L) 11.5 - 16.0 g/dL    Hematocrit 16.1 (L) 35.0 - 47.0 %    MCV 83.2 80.0 - 99.0 FL    MCH 26.3 26.0 - 34.0 PG    MCHC 31.6 30.0 - 36.5 g/dL    RDW 09.6 (H) 04.5 - 14.5 %    Platelets 341 150 - 400 K/uL    MPV 10.0 8.9 - 12.9 FL    Nucleated RBCs 0.0 0 PER 100 WBC    nRBC 0.00 0.00 - 0.01 K/uL    Neutrophils % 87 (H) 32 - 75 %    Lymphocytes % 2 (L) 12 - 49 %    Monocytes % 11 5 - 13 %    Eosinophils % 0 0 - 7 %    Basophils % 0 0 - 1 %    Immature Granulocytes % 0 %    Neutrophils Absolute 22.27 (H) 1.8 - 8.0 K/UL    Lymphocytes Absolute  0.51 (L) 0.8 - 3.5 K/UL    Monocytes Absolute 2.82 (H) 0.0 - 1.0 K/UL    Eosinophils Absolute 0.00 0.0 - 0.4 K/UL    Basophils Absolute 0.00 0.0 - 0.1 K/UL    Immature Granulocytes Absolute 0.00 K/UL    Differential Type MANUAL      Platelet Comment CLUMPED PLATELETS      RBC Comment ANISOCYTOSIS  2+        RBC Comment HYPOCHROMIA  1+        RBC Comment POLYCHROMASIA  PRESENT       Extra Tubes Hold    Collection Time: 06/05/23  9:21 PM   Result Value Ref Range    Specimen HOld 1RED,1BLU,1PST     Comment:        Add-on orders for these samples will be processed based on acceptable specimen integrity and analyte stability, which may vary by analyte.   Lipase    Collection Time: 06/05/23  9:21 PM   Result Value Ref Range    Lipase 27 13 - 75 U/L   Acetaminophen Level    Collection Time: 06/05/23  9:21 PM   Result Value Ref Range    Acetaminophen Level 8 (L) 10 - 30 ug/mL   Procalcitonin    Collection Time: 06/05/23  9:21 PM   Result Value Ref Range    Procalcitonin 2.68 ng/mL   Ammonia    Collection Time: 06/05/23  9:31 PM   Result Value Ref Range    Ammonia 10 <32 UMOL/L   Blood Culture 1    Collection Time: 06/05/23  9:50 PM    Specimen: Blood   Result Value Ref Range    Special Requests NO SPECIAL REQUESTS  RIGHT  Antecubital        Culture NO GROWTH <24 HRS     Blood Culture 2    Collection Time: 06/05/23 10:13 PM    Specimen: Blood   Result Value Ref Range    Special Requests NO SPECIAL REQUESTS  RIGHT  Antecubital        Culture NO GROWTH <24 HRS     Lactic Acid    Collection Time: 06/05/23 10:13 PM   Result Value Ref Range    Lactic Acid, Plasma 1.2 0.4 - 2.0 MMOL/L   Urinalysis with Reflex to Culture    Collection Time: 06/05/23 11:24  PM    Specimen: Urine   Result Value Ref Range    Color, UA DARK YELLOW      Appearance CLOUDY (A) CLEAR      Specific Gravity, UA 1.020 1.003 - 1.030      pH, Urine 5.5 5.0 - 8.0      Protein, UA 100 (A) NEG mg/dL    Glucose, Ur 161 (A) NEG mg/dL    Ketones, Urine Negative NEG  mg/dL    Bilirubin, Urine Negative NEG      Blood, Urine SMALL (A) NEG      Urobilinogen, Urine 1.0 0.2 - 1.0 EU/dL    Nitrite, Urine Negative NEG      Leukocyte Esterase, Urine MODERATE (A) NEG      WBC, UA >100 (H) 0 - 4 /hpf    RBC, UA 0-5 0 - 5 /hpf    Epithelial Cells, UA FEW FEW /lpf    BACTERIA, URINE 4+ (A) NEG /hpf    Urine Culture if Indicated URINE CULTURE ORDERED (A) CNI      Hyaline Casts, UA 2-5 0 - 5 /lpf         Imaging:     Assessment:     ***       Plan:     1.             NIDDM II  -SSI +Hypoglycemic protocols    Obesity  -Counseled on weight loss, dieting and exercise                FEN/GI -  ns@***ml/hr  Activity - as tolerated  DVT prophylaxis - Lovenox***  GI prophylaxis -  none indicated***  Disposition - home***    CODE STATUS:   full code***       Signed By: Ellsworth Lennox, MD     June 05, 2023

## 2023-06-05 NOTE — H&P (Addendum)
 History & Physical    Primary Care Provider: Darrell Jewel, MD  Source of Information: Patient and chart review    History of Presenting Illness:   Mary Woodward is a 89 y.o. female with with pmh of paroxysmal atrial fibrillation, HFpEF, pulmonary edema, hypertension, hyperlipidemia, PE, osteoarthritis, osteoporosis who presented to ed with complaints of abdominal pain, nausea and vomiting. Some cramping abdominal pain and constipation over the last week with nausea and non-bloody, non bilious emesis over the last 2 days.  Also with poor po intake, malaise and fatigue. Suffered a recent fall with a pelvic fracture; treated conservatively. Has had difficulty ambulating as a result.    The patient denies any fever, chills, chest pain, cough, congestion, recent illness, palpitations, or dysuria.  Remarkable vitals on ER Presentation: vss  Labs Remarkable for: wbc 25.6, hgb 8.6, na 128, cr 2.73, t-bili 2.1, alt 909, ast 926, alk phos 216, alb 2.6, UA: grossly infected.  ER Images: ct abd et pelvis: no acute process. Sludge in gallbladder  ER Rx: vanc, cefepime, 2.3l ns bolus, zofran     Review of Systems:  Pertinent items are noted in the History of Present Illness.     No past medical history on file.   No past surgical history on file.  Prior to Admission medications    Medication Sig Start Date End Date Taking? Authorizing Provider   amiodarone (CORDARONE) 200 MG tablet Take 1 tablet by mouth 2 times daily for 3 days, THEN 1 tablet daily for 27 days. 1 tablet twice a day (9 am and 9 pm) through 6/12, then 1 tablet daily (9 am) going forward. 10/11/22 11/10/22  Simonne Maffucci, Theophilus Kinds, MD   apixaban Everlene Balls) 2.5 MG TABS tablet Take 1 tablet by mouth 2 times daily 10/11/22   Simonne Maffucci, Theophilus Kinds, MD   losartan (COZAAR) 25 MG tablet Take 0.5 tablets by mouth every evening 10/11/22   Simonne Maffucci, Theophilus Kinds, MD   metoprolol succinate (TOPROL XL) 25 MG extended release tablet Take 0.5 tablets by mouth daily 10/11/22    Simonne Maffucci, Theophilus Kinds, MD   furosemide (LASIX) 40 MG tablet Take 1 tablet by mouth daily 10/11/22   Simonne Maffucci, Theophilus Kinds, MD   atorvastatin (LIPITOR) 10 MG tablet Take 1 tablet by mouth daily 10/11/22   Simonne Maffucci, Theophilus Kinds, MD   empagliflozin (JARDIANCE) 10 MG tablet Take 1 tablet by mouth daily 09/21/22   Ronnie Derby, MD   pantoprazole (PROTONIX) 40 MG tablet Take 1 tablet by mouth every morning (before breakfast) 09/21/22   Ronnie Derby, MD   aspirin 81 MG chewable tablet Take 1 tablet by mouth daily    [provider]   Multiple Vitamins-Minerals (PRESERVISION AREDS 2) CHEW Take 1 tablet by mouth in the morning and at bedtime    [provider]     No Known Allergies   No family history on file.     SOCIAL HISTORY:  Patient resides:  Independently x   Assisted Living    SNF    With family care       Smoking history:   None x   Former    Chronic      Alcohol history:   None x   Social    Chronic      Ambulates:   Independently x   w/cane    w/walker    w/wc    CODE STATUS:  DNR    Full x  Other      Objective:     Physical Exam:     BP 132/89   Pulse 74   Temp 98.3 F (36.8 C) (Oral)   Resp 24   Ht 1.524 m (5')   Wt 67 kg (147 lb 11.3 oz)   SpO2 91%   BMI 28.85 kg/m         General:  Alert, cooperative, no distress, appears stated age.   Head:  Normocephalic, without obvious abnormality, atraumatic.   Eyes:  Conjunctivae/corneas clear. PERRL, EOMs intact.   Nose: Nares normal. Septum midline. Mucosa normal.        Neck: Supple, symmetrical, trachea midline.       Lungs:   Clear to auscultation bilaterally.   Chest wall:  No tenderness or deformity.   Heart:  Regular rate and rhythm, S1, S2 normal   Abdomen:   Soft, non-tender. Bowel sounds normal. No masses,  No organomegaly.   Extremities: Extremities normal, atraumatic, no cyanosis or edema.   Pulses: 2+ and symmetric all extremities.   Skin: Skin color, texture, turgor normal. No rashes or lesions   Neurologic: CNII-XII  grossly intact.          EKG:  ns rw/ 1st degree AV block  Data Review:     Recent Days:  Recent Labs     06/05/23  2121   WBC 25.6*   HGB 8.6*   HCT 27.2*   PLT 341     Recent Labs     06/05/23  2121   NA 128*   K 4.8   CL 100   CO2 21   BUN 47*   ALT 909*     No results for input(s): "PH", "PCO2", "PO2", "HCO3", "FIO2" in the last 72 hours.    24 Hour Results:  Recent Results (from the past 24 hour(s))   EKG 12 Lead    Collection Time: 06/05/23  9:13 PM   Result Value Ref Range    Ventricular Rate 63 BPM    Atrial Rate 63 BPM    P-R Interval 234 ms    QRS Duration 160 ms    Q-T Interval 502 ms    QTc Calculation (Bazett) 513 ms    R Axis -20 degrees    T Axis 122 degrees    Diagnosis       Sinus rhythm with 1st degree AV block  Left bundle branch block  Abnormal ECG  When compared with ECG of 07-Oct-2022 10:22,  PR interval has increased  Questionable change in QRS axis     Comprehensive Metabolic Panel    Collection Time: 06/05/23  9:21 PM   Result Value Ref Range    Sodium 128 (L) 136 - 145 mmol/L    Potassium 4.8 3.5 - 5.1 mmol/L    Chloride 100 97 - 108 mmol/L    CO2 21 21 - 32 mmol/L    Anion Gap 7 2 - 12 mmol/L    Glucose 112 (H) 65 - 100 mg/dL    BUN 47 (H) 6 - 20 MG/DL    Creatinine 1.61 (H) 0.55 - 1.02 MG/DL    BUN/Creatinine Ratio 17 12 - 20      Est, Glom Filt Rate 16 (L) >60 ml/min/1.96m2    Calcium 8.4 (L) 8.5 - 10.1 MG/DL    Total Bilirubin 2.1 (H) 0.2 - 1.0 MG/DL    ALT 096 (H) 12 - 78 U/L    AST 926 (H)  15 - 37 U/L    Alk Phosphatase 216 (H) 45 - 117 U/L    Total Protein 6.6 6.4 - 8.2 g/dL    Albumin 2.6 (L) 3.5 - 5.0 g/dL    Globulin 4.0 2.0 - 4.0 g/dL    Albumin/Globulin Ratio 0.7 (L) 1.1 - 2.2     CBC with Auto Differential    Collection Time: 06/05/23  9:21 PM   Result Value Ref Range    WBC 25.6 (H) 3.6 - 11.0 K/uL    RBC 3.27 (L) 3.80 - 5.20 M/uL    Hemoglobin 8.6 (L) 11.5 - 16.0 g/dL    Hematocrit 96.2 (L) 35.0 - 47.0 %    MCV 83.2 80.0 - 99.0 FL    MCH 26.3 26.0 - 34.0 PG    MCHC 31.6 30.0  - 36.5 g/dL    RDW 95.2 (H) 84.1 - 14.5 %    Platelets 341 150 - 400 K/uL    MPV 10.0 8.9 - 12.9 FL    Nucleated RBCs 0.0 0 PER 100 WBC    nRBC 0.00 0.00 - 0.01 K/uL    Neutrophils % 87 (H) 32 - 75 %    Lymphocytes % 2 (L) 12 - 49 %    Monocytes % 11 5 - 13 %    Eosinophils % 0 0 - 7 %    Basophils % 0 0 - 1 %    Immature Granulocytes % 0 %    Neutrophils Absolute 22.27 (H) 1.8 - 8.0 K/UL    Lymphocytes Absolute 0.51 (L) 0.8 - 3.5 K/UL    Monocytes Absolute 2.82 (H) 0.0 - 1.0 K/UL    Eosinophils Absolute 0.00 0.0 - 0.4 K/UL    Basophils Absolute 0.00 0.0 - 0.1 K/UL    Immature Granulocytes Absolute 0.00 K/UL    Differential Type MANUAL      Platelet Comment CLUMPED PLATELETS      RBC Comment ANISOCYTOSIS  2+        RBC Comment HYPOCHROMIA  1+        RBC Comment POLYCHROMASIA  PRESENT       Extra Tubes Hold    Collection Time: 06/05/23  9:21 PM   Result Value Ref Range    Specimen HOld 1RED,1BLU,1PST     Comment:        Add-on orders for these samples will be processed based on acceptable specimen integrity and analyte stability, which may vary by analyte.   Lipase    Collection Time: 06/05/23  9:21 PM   Result Value Ref Range    Lipase 27 13 - 75 U/L   Acetaminophen Level    Collection Time: 06/05/23  9:21 PM   Result Value Ref Range    Acetaminophen Level 8 (L) 10 - 30 ug/mL   Procalcitonin    Collection Time: 06/05/23  9:21 PM   Result Value Ref Range    Procalcitonin 2.68 ng/mL   Ammonia    Collection Time: 06/05/23  9:31 PM   Result Value Ref Range    Ammonia 10 <32 UMOL/L   Blood Culture 1    Collection Time: 06/05/23  9:50 PM    Specimen: Blood   Result Value Ref Range    Special Requests NO SPECIAL REQUESTS  RIGHT  Antecubital        Culture NO GROWTH <24 HRS     Blood Culture 2    Collection Time: 06/05/23 10:13 PM    Specimen: Blood  Result Value Ref Range    Special Requests NO SPECIAL REQUESTS  RIGHT  Antecubital        Culture NO GROWTH <24 HRS     Lactic Acid    Collection Time: 06/05/23 10:13 PM    Result Value Ref Range    Lactic Acid, Plasma 1.2 0.4 - 2.0 MMOL/L   Urinalysis with Reflex to Culture    Collection Time: 06/05/23 11:24 PM    Specimen: Urine   Result Value Ref Range    Color, UA DARK YELLOW      Appearance CLOUDY (A) CLEAR      Specific Gravity, UA 1.020 1.003 - 1.030      pH, Urine 5.5 5.0 - 8.0      Protein, UA 100 (A) NEG mg/dL    Glucose, Ur 578 (A) NEG mg/dL    Ketones, Urine Negative NEG mg/dL    Bilirubin, Urine Negative NEG      Blood, Urine SMALL (A) NEG      Urobilinogen, Urine 1.0 0.2 - 1.0 EU/dL    Nitrite, Urine Negative NEG      Leukocyte Esterase, Urine MODERATE (A) NEG      WBC, UA >100 (H) 0 - 4 /hpf    RBC, UA 0-5 0 - 5 /hpf    Epithelial Cells, UA FEW FEW /lpf    BACTERIA, URINE 4+ (A) NEG /hpf    Urine Culture if Indicated URINE CULTURE ORDERED (A) CNI      Hyaline Casts, UA 2-5 0 - 5 /lpf         Imaging:     Assessment:     Zaya Kessenich is a 89 y.o. female with with pmh of paroxysmal atrial fibrillation, HFpEF, pulmonary edema, hypertension, hyperlipidemia, PE, osteoarthritis, osteoporosis who is admitted for acute cystitis.       Plan:       Acute Cystitis  -continue rocephin 1g daily  -follow urine cultures    Cholestasis / Hyperbilirubinemia / Transaminitis  -gallbladder sludge on ct abd et pelvis  -check ruq [place] and mrcp  -hepatitis panel pending  -may need mrcp in am  -gi consult - keep npo    Constipation  -miralax and colace bid  -mivf    Anemia  -normocytic with hgb 8.6  -check fe profile, ferritin, b12 and folate    HFpEF  -compensated. Gentle iv hydration  -gdmt as tolerated    AKI on CKD III  -cr 2.73 w/ baseline 2  -cont gentle ivfs'  -bmp daily    Afib / Hx of PE  -not anticoagulated    HTN  -low labile bps  -hold hom emeds    GERD / Dyslipidemia   -pta home meds        FEN/GI -  ns@75ml /hr  Activity - as tolerated  DVT prophylaxis - heparin  GI prophylaxis -  none indicated  Disposition - home    CODE STATUS:   full code       Signed By: Ellsworth Lennox, MD      June 05, 2023

## 2023-06-05 NOTE — ED Triage Notes (Signed)
Pt arrived via EMS from [facility] with CC N/V worsening over the past 2 days. 1/19 GLF - fractured pelvis and has not been feeling well since. +Malaise. [email or web address]    PMH: CHF

## 2023-06-06 ENCOUNTER — Inpatient Hospital Stay
Admission: EM | Admit: 2023-06-06 | Discharge: 2023-06-10 | Disposition: A | Discharge status: Hospice | Payer: MEDICARE | Admitting: Student in an Organized Health Care Education/Training Program

## 2023-06-06 ENCOUNTER — Observation Stay: Admit: 2023-06-06 | Payer: MEDICARE | Primary: Internal Medicine

## 2023-06-06 ENCOUNTER — Inpatient Hospital Stay: Admit: 2023-06-06 | Payer: MEDICARE | Primary: Internal Medicine

## 2023-06-06 DIAGNOSIS — A4151 Sepsis due to Escherichia coli [E. coli]: Secondary | ICD-10-CM

## 2023-06-06 LAB — CBC WITH AUTO DIFFERENTIAL
Basophils %: 0 % (ref 0–1)
Basophils %: 0.2 % (ref 0.0–1.0)
Basophils Absolute: 0 10*3/uL (ref 0.0–0.1)
Basophils Absolute: 0.04 10*3/uL (ref 0.00–0.10)
Eosinophils %: 0 % (ref 0–7)
Eosinophils %: 0 % (ref 0.0–7.0)
Eosinophils Absolute: 0 10*3/uL (ref 0.0–0.4)
Eosinophils Absolute: 0 10*3/uL (ref 0.00–0.40)
Hematocrit: 27.2 % — ABNORMAL LOW (ref 35.0–47.0)
Hematocrit: 27.8 % — ABNORMAL LOW (ref 35.0–47.0)
Hemoglobin: 8.6 g/dL — ABNORMAL LOW (ref 11.5–16.0)
Hemoglobin: 8.6 g/dL — ABNORMAL LOW (ref 11.5–16.0)
Immature Granulocytes %: 0 %
Immature Granulocytes %: 1.3 % — ABNORMAL HIGH (ref 0.0–0.5)
Immature Granulocytes Absolute: 0 10*3/uL
Immature Granulocytes Absolute: 0.28 10*3/uL — ABNORMAL HIGH (ref 0.00–0.04)
Lymphocytes %: 2 % — ABNORMAL LOW (ref 12–49)
Lymphocytes %: 2.2 % — ABNORMAL LOW (ref 12.0–49.0)
Lymphocytes Absolute: 0.51 10*3/uL — ABNORMAL LOW (ref 0.8–3.5)
Lymphocytes Absolute: 0.47 10*3/uL — ABNORMAL LOW (ref 0.80–3.50)
MCH: 26.3 pg (ref 26.0–34.0)
MCH: 26.6 pg (ref 26.0–34.0)
MCHC: 31.6 g/dL (ref 30.0–36.5)
MCHC: 30.9 g/dL (ref 30.0–36.5)
MCV: 83.2 fL (ref 80.0–99.0)
MCV: 86.1 fL (ref 80.0–99.0)
MPV: 10 fL (ref 8.9–12.9)
MPV: 10.2 fL (ref 8.9–12.9)
Monocytes %: 11 % (ref 5–13)
Monocytes %: 4.7 % — ABNORMAL LOW (ref 5.0–13.0)
Monocytes Absolute: 2.82 10*3/uL — ABNORMAL HIGH (ref 0.0–1.0)
Monocytes Absolute: 1.01 10*3/uL — ABNORMAL HIGH (ref 0.00–1.00)
Neutrophils %: 87 % — ABNORMAL HIGH (ref 32–75)
Neutrophils %: 91.6 % — ABNORMAL HIGH (ref 32.0–75.0)
Neutrophils Absolute: 22.27 10*3/uL — ABNORMAL HIGH (ref 1.8–8.0)
Neutrophils Absolute: 19.69 10*3/uL — ABNORMAL HIGH (ref 1.80–8.00)
Nucleated RBCs: 0 /100{WBCs}
Nucleated RBCs: 0 /100{WBCs}
Platelets: 341 10*3/uL (ref 150–400)
Platelets: 342 10*3/uL (ref 150–400)
RBC: 3.27 M/uL — ABNORMAL LOW (ref 3.80–5.20)
RBC: 3.23 M/uL — ABNORMAL LOW (ref 3.80–5.20)
RDW: 21.9 % — ABNORMAL HIGH (ref 11.5–14.5)
RDW: 22.1 % — ABNORMAL HIGH (ref 11.5–14.5)
WBC: 25.6 10*3/uL — ABNORMAL HIGH (ref 3.6–11.0)
WBC: 21.5 10*3/uL — ABNORMAL HIGH (ref 3.6–11.0)
nRBC: 0 10*3/uL (ref 0.00–0.01)
nRBC: 0 10*3/uL (ref 0.00–0.01)

## 2023-06-06 LAB — EXTRA TUBES HOLD

## 2023-06-06 LAB — EKG 12-LEAD
Atrial Rate: 63 {beats}/min
P-R Interval: 234 ms
Q-T Interval: 502 ms
QRS Duration: 160 ms
QTc Calculation (Bazett): 513 ms
R Axis: -20 degrees
T Axis: 122 degrees
Ventricular Rate: 63 {beats}/min

## 2023-06-06 LAB — CULTURE, BLOOD, PCR ID PANEL RESULTS REPORT
Acinetobacter calcoac baumannii complex by PCR: NOT DETECTED
Bacteroides fragilis by PCR: NOT DETECTED
Candida albicans by PCR: NOT DETECTED
Candida auris by PCR: NOT DETECTED
Candida glabrata: NOT DETECTED
Candida krusei by PCR: NOT DETECTED
Candida parapsilosis by PCR: NOT DETECTED
Candida tropicalis by PCR: NOT DETECTED
Colistin Resistance mcr-1 gene by PCR: NOT DETECTED
Cryptococcus neoformans/gattii by PCR: NOT DETECTED
Enterobacter cloacae complex by PCR: NOT DETECTED
Enterobacteriaceae by PCR: DETECTED — AB
Enterococcus faecalis by PCR: NOT DETECTED
Enterococcus faecium by PCR: NOT DETECTED
Escherichia Coli: DETECTED — AB
Haemophilus Influenzae by PCR: NOT DETECTED
KPC (Carbapenem resistance gene): NOT DETECTED
Klebsiella aerogenes by PCR: NOT DETECTED
Klebsiella oxytoca by PCR: NOT DETECTED
Klebsiella pneumoniae group by PCR: NOT DETECTED
Listeria monocytogenes by PCR: NOT DETECTED
Neisseria meningitidis by PCR: NOT DETECTED
Proteus by PCR: NOT DETECTED
Pseudomonas aeruginosa: NOT DETECTED
Resistant gene ctx-m by PCR: NOT DETECTED
Resistant gene imp by PCR: NOT DETECTED
Resistant gene ndm by PCR: NOT DETECTED
Resistant gene oxa-48-like by pcr: NOT DETECTED
Resistant gene vim by PCR: NOT DETECTED
STAPHYLOCOCCUS: NOT DETECTED
STREPTOCOCCUS: NOT DETECTED
Salmonella species by PCR: NOT DETECTED
Serratia marcescens by PCR: NOT DETECTED
Staphylococcus Aureus: NOT DETECTED
Staphylococcus epidermidis by PCR: NOT DETECTED
Staphylococcus lugdunensis by PCR: NOT DETECTED
Stenotrophomonas maltophilia by PCR: NOT DETECTED
Strep pneumoniae: NOT DETECTED
Strep pyogenes,(Grp. A): NOT DETECTED
Streptococcus agalactiae (Group B): NOT DETECTED

## 2023-06-06 LAB — IRON AND TIBC
Iron % Saturation: 6 % — ABNORMAL LOW (ref 20–50)
Iron: 13 ug/dL — ABNORMAL LOW (ref 35–150)
TIBC: 236 ug/dL — ABNORMAL LOW (ref 250–450)

## 2023-06-06 LAB — URINALYSIS WITH REFLEX TO CULTURE
Bilirubin, Urine: NEGATIVE
Glucose, Ur: 100 mg/dL — AB
Ketones, Urine: NEGATIVE mg/dL
Nitrite, Urine: NEGATIVE
Protein, UA: 100 mg/dL — AB
Specific Gravity, UA: 1.02 (ref 1.003–1.030)
Urobilinogen, Urine: 1 U/dL (ref 0.2–1.0)
WBC, UA: >100 /[HPF] — ABNORMAL HIGH (ref 0–4)
pH, Urine: 5.5 (ref 5.0–8.0)

## 2023-06-06 LAB — COMPREHENSIVE METABOLIC PANEL
ALT: 909 U/L — ABNORMAL HIGH (ref 12–78)
ALT: 919 U/L — ABNORMAL HIGH (ref 12–78)
AST: 926 U/L — ABNORMAL HIGH (ref 15–37)
AST: 929 U/L — ABNORMAL HIGH (ref 15–37)
Albumin/Globulin Ratio: 0.7 — ABNORMAL LOW (ref 1.1–2.2)
Albumin/Globulin Ratio: 0.7 — ABNORMAL LOW (ref 1.1–2.2)
Albumin: 2.6 g/dL — ABNORMAL LOW (ref 3.5–5.0)
Albumin: 2.5 g/dL — ABNORMAL LOW (ref 3.5–5.0)
Alk Phosphatase: 216 U/L — ABNORMAL HIGH (ref 45–117)
Alk Phosphatase: 206 U/L — ABNORMAL HIGH (ref 45–117)
Anion Gap: 7 mmol/L (ref 2–12)
Anion Gap: 9 mmol/L (ref 2–12)
BUN/Creatinine Ratio: 17 (ref 12–20)
BUN/Creatinine Ratio: 20 (ref 12–20)
BUN: 47 mg/dL — ABNORMAL HIGH (ref 6–20)
BUN: 41 mg/dL — ABNORMAL HIGH (ref 6–20)
CO2: 21 mmol/L (ref 21–32)
CO2: 16 mmol/L — ABNORMAL LOW (ref 21–32)
Calcium: 8.4 mg/dL — ABNORMAL LOW (ref 8.5–10.1)
Calcium: 7.9 mg/dL — ABNORMAL LOW (ref 8.5–10.1)
Chloride: 100 mmol/L (ref 97–108)
Chloride: 107 mmol/L (ref 97–108)
Creatinine: 2.73 mg/dL — ABNORMAL HIGH (ref 0.55–1.02)
Creatinine: 2.08 mg/dL — ABNORMAL HIGH (ref 0.55–1.02)
Est, Glom Filt Rate: 16 mL/min/{1.73_m2} — ABNORMAL LOW (ref >60)
Est, Glom Filt Rate: 22 mL/min/{1.73_m2} — ABNORMAL LOW (ref >60)
Globulin: 4 g/dL (ref 2.0–4.0)
Globulin: 3.8 g/dL (ref 2.0–4.0)
Glucose: 112 mg/dL — ABNORMAL HIGH (ref 65–100)
Glucose: 97 mg/dL (ref 65–100)
Potassium: 4.8 mmol/L (ref 3.5–5.1)
Potassium: 4.5 mmol/L (ref 3.5–5.1)
Sodium: 128 mmol/L — ABNORMAL LOW (ref 136–145)
Sodium: 132 mmol/L — ABNORMAL LOW (ref 136–145)
Total Bilirubin: 2.1 mg/dL — ABNORMAL HIGH (ref 0.2–1.0)
Total Bilirubin: 2 mg/dL — ABNORMAL HIGH (ref 0.2–1.0)
Total Protein: 6.6 g/dL (ref 6.4–8.2)
Total Protein: 6.3 g/dL — ABNORMAL LOW (ref 6.4–8.2)

## 2023-06-06 LAB — COVID-19 & INFLUENZA COMBO
Rapid Influenza A By PCR: NOT DETECTED
Rapid Influenza B By PCR: NOT DETECTED
SARS-CoV-2, PCR: NOT DETECTED

## 2023-06-06 LAB — AMMONIA: Ammonia: 10 umol/L (ref <32)

## 2023-06-06 LAB — PROCALCITONIN: Procalcitonin: 2.68 ng/mL

## 2023-06-06 LAB — HEPATITIS PANEL, ACUTE
Hep A IgM: NONREACTIVE
Hep B Core Ab, IgM: NONREACTIVE
Hep B S Ag Interp: NEGATIVE
Hep C Ab Interp: NONREACTIVE
Hepatitis B Surface Ag: 0.15 {index}
Hepatitis C Ab: 0.13 {index}

## 2023-06-06 LAB — VITAMIN B12 & FOLATE
Folate: 28.3 ng/mL — ABNORMAL HIGH (ref 5.0–21.0)
Vitamin B-12: 957 pg/mL (ref 193–986)

## 2023-06-06 LAB — FERRITIN: Ferritin: 2983 ng/mL — ABNORMAL HIGH (ref 8–252)

## 2023-06-06 LAB — TROPONIN: Troponin, High Sensitivity: 28 ng/L (ref 0–51)

## 2023-06-06 LAB — LIPASE: Lipase: 27 U/L (ref 13–75)

## 2023-06-06 LAB — ACETAMINOPHEN LEVEL: Acetaminophen Level: 8 ug/mL — ABNORMAL LOW (ref 10–30)

## 2023-06-06 LAB — LACTIC ACID
Lactic Acid, Plasma: 1.2 MMOL/L (ref 0.4–2.0)
Lactic Acid, Plasma: 1.6 MMOL/L (ref 0.4–2.0)

## 2023-06-06 MED ORDER — ACETAMINOPHEN 650 MG RE SUPP
650 MG | Freq: Four times a day (QID) | RECTAL | Status: DC | PRN
Start: 2023-06-06 — End: 2023-06-10

## 2023-06-06 MED ORDER — SODIUM CHLORIDE 0.9 % IV SOLN
0.9 % | INTRAVENOUS | Status: DC | PRN
Start: 2023-06-06 — End: 2023-06-09

## 2023-06-06 MED ORDER — LACTATED RINGERS IV SOLN
INTRAVENOUS | Status: DC
Start: 2023-06-06 — End: 2023-06-08
  Administered 2023-06-06 – 2023-06-08 (×2): via INTRAVENOUS

## 2023-06-06 MED ORDER — SODIUM CHLORIDE 0.9 % IV SOLN
0.9 | INTRAVENOUS | Status: DC
Start: 2023-06-06 — End: 2023-06-06
  Administered 2023-06-06: 07:00:00 via INTRAVENOUS

## 2023-06-06 MED ORDER — CEFTRIAXONE SODIUM 1 G IJ SOLR
1 | INTRAMUSCULAR | Status: DC
Start: 2023-06-06 — End: 2023-06-06
  Administered 2023-06-06: 16:00:00 1000 mg via INTRAVENOUS

## 2023-06-06 MED ORDER — STERILE WATER FOR INJECTION (MIXTURES ONLY)
2 | Freq: Once | INTRAVENOUS | Status: AC
Start: 2023-06-06 — End: 2023-06-05
  Administered 2023-06-06: 04:00:00 2000 mg via INTRAVENOUS

## 2023-06-06 MED ORDER — NORMAL SALINE FLUSH 0.9 % IV SOLN
0.9 % | Freq: Two times a day (BID) | INTRAVENOUS | Status: DC
Start: 2023-06-06 — End: 2023-06-09
  Administered 2023-06-06 – 2023-06-09 (×7): 10 mL via INTRAVENOUS

## 2023-06-06 MED ORDER — ACETAMINOPHEN 325 MG PO TABS
325 MG | Freq: Four times a day (QID) | ORAL | Status: DC | PRN
Start: 2023-06-06 — End: 2023-06-10
  Administered 2023-06-08 – 2023-06-09 (×2): 650 mg via ORAL

## 2023-06-06 MED ORDER — ONDANSETRON 4 MG PO TBDP
4 MG | Freq: Three times a day (TID) | ORAL | Status: DC | PRN
Start: 2023-06-06 — End: 2023-06-10

## 2023-06-06 MED ORDER — POLYETHYLENE GLYCOL 3350 17 G PO PACK
17 g | Freq: Every day | ORAL | Status: DC | PRN
Start: 2023-06-06 — End: 2023-06-10

## 2023-06-06 MED ORDER — CEFTRIAXONE SODIUM 2 G IJ SOLR
2 g | INTRAMUSCULAR | Status: DC
Start: 2023-06-06 — End: 2023-06-09
  Administered 2023-06-07 – 2023-06-09 (×3): 2000 mg via INTRAVENOUS

## 2023-06-06 MED ORDER — SODIUM CHLORIDE 0.9 % IV BOLUS
0.9 % | Freq: Once | INTRAVENOUS | Status: DC
Start: 2023-06-06 — End: 2023-06-09

## 2023-06-06 MED ORDER — POLYETHYLENE GLYCOL 3350 17 G PO PACK
17 g | Freq: Two times a day (BID) | ORAL | Status: DC
Start: 2023-06-06 — End: 2023-06-09
  Administered 2023-06-06 – 2023-06-07 (×4): 17 g via ORAL

## 2023-06-06 MED ORDER — ATORVASTATIN CALCIUM 10 MG PO TABS
10 MG | Freq: Every day | ORAL | Status: DC
Start: 2023-06-06 — End: 2023-06-09
  Administered 2023-06-06 – 2023-06-09 (×4): 10 mg via ORAL

## 2023-06-06 MED ORDER — NORMAL SALINE FLUSH 0.9 % IV SOLN
0.9 % | INTRAVENOUS | Status: DC | PRN
Start: 2023-06-06 — End: 2023-06-09
  Administered 2023-06-08: 15:00:00 10 mL via INTRAVENOUS

## 2023-06-06 MED ORDER — GADOTERIDOL 279.3 MG/ML IV SOLN
279.3 | Freq: Once | INTRAVENOUS | Status: AC | PRN
Start: 2023-06-06 — End: 2023-06-06
  Administered 2023-06-06: 18:00:00 14 mL via INTRAVENOUS

## 2023-06-06 MED ORDER — VANCOMYCIN HCL 1750 MG/350ML IV SOLN
1750350 | Freq: Once | INTRAVENOUS | Status: AC
Start: 2023-06-06 — End: 2023-06-06
  Administered 2023-06-06: 04:00:00 1750 mg/kg via INTRAVENOUS

## 2023-06-06 MED ORDER — SODIUM CHLORIDE 0.9 % IV BOLUS
0.9 | Freq: Once | INTRAVENOUS | Status: AC
Start: 2023-06-06 — End: 2023-06-05
  Administered 2023-06-06: 03:00:00 1000 mL via INTRAVENOUS

## 2023-06-06 MED ORDER — SENNA-DOCUSATE SODIUM 8.6-50 MG PO TABS
8.6-50 MG | Freq: Two times a day (BID) | ORAL | Status: DC
Start: 2023-06-06 — End: 2023-06-09
  Administered 2023-06-06 – 2023-06-07 (×4): 2 via ORAL

## 2023-06-06 MED ORDER — STERILE WATER FOR INJECTION (MIXTURES ONLY)
1 | Freq: Once | INTRAMUSCULAR | Status: AC
Start: 2023-06-06 — End: 2023-06-06
  Administered 2023-06-06: 22:00:00 1000 mg via INTRAVENOUS

## 2023-06-06 MED ORDER — PANTOPRAZOLE SODIUM 40 MG PO TBEC
40 MG | Freq: Every day | ORAL | Status: DC
Start: 2023-06-06 — End: 2023-06-09
  Administered 2023-06-06 – 2023-06-09 (×4): 40 mg via ORAL

## 2023-06-06 MED ORDER — HEPARIN SODIUM (PORCINE) 5000 UNIT/ML IJ SOLN
5000 | Freq: Three times a day (TID) | INTRAMUSCULAR | Status: DC
Start: 2023-06-06 — End: 2023-06-06

## 2023-06-06 MED ORDER — SODIUM CHLORIDE 0.9 % IV BOLUS
0.9 % | Freq: Once | INTRAVENOUS | Status: AC
Start: 2023-06-06 — End: 2023-06-05
  Administered 2023-06-06: 04:00:00 1300 mL via INTRAVENOUS

## 2023-06-06 MED ORDER — ONDANSETRON HCL 4 MG/2ML IJ SOLN
42 MG/2ML | Freq: Four times a day (QID) | INTRAMUSCULAR | Status: DC | PRN
Start: 2023-06-06 — End: 2023-06-10
  Administered 2023-06-06 – 2023-06-08 (×2): 4 mg via INTRAVENOUS

## 2023-06-06 MED ORDER — ONDANSETRON HCL 4 MG/2ML IJ SOLN
42 | Freq: Once | INTRAMUSCULAR | Status: AC
Start: 2023-06-06 — End: 2023-06-05
  Administered 2023-06-06: 03:00:00 4 mg via INTRAVENOUS

## 2023-06-06 MED FILL — PANTOPRAZOLE SODIUM 40 MG PO TBEC: 40 MG | ORAL | Qty: 1

## 2023-06-06 MED FILL — ATORVASTATIN CALCIUM 10 MG PO TABS: 10 MG | ORAL | Qty: 1

## 2023-06-06 MED FILL — SENNA PLUS 8.6-50 MG PO TABS: 8.6-50 MG | ORAL | Qty: 1

## 2023-06-06 MED FILL — SODIUM CHLORIDE 0.9 % IV SOLN: 0.9 % | INTRAVENOUS | Qty: 1000

## 2023-06-06 MED FILL — ONDANSETRON HCL 4 MG/2ML IJ SOLN: 42 MG/2ML | INTRAMUSCULAR | Qty: 2

## 2023-06-06 MED FILL — MONOJECT FLUSH SYRINGE 0.9 % IV SOLN: 0.9 % | INTRAVENOUS | Qty: 40

## 2023-06-06 MED FILL — SENNA PLUS 8.6-50 MG PO TABS: 8.6-50 MG | ORAL | Qty: 2

## 2023-06-06 MED FILL — SODIUM CHLORIDE 0.9 % IV SOLN: 0.9 % | INTRAVENOUS | Qty: 2000

## 2023-06-06 MED FILL — MIRALAX MIX-IN PAX 17 G PO PACK: 17 g | ORAL | Qty: 1

## 2023-06-06 MED FILL — CEFEPIME HCL 2 G IV SOLR: 2 g | INTRAVENOUS | Qty: 2

## 2023-06-06 MED FILL — PROHANCE 279.3 MG/ML IV SOLN: 279.3 MG/ML | INTRAVENOUS | Qty: 15

## 2023-06-06 MED FILL — LACTATED RINGERS IV SOLN: INTRAVENOUS | Qty: 1000

## 2023-06-06 MED FILL — VANCOMYCIN HCL 1750 MG/350ML IV SOLN: 1750350 MG/350ML | INTRAVENOUS | Qty: 350

## 2023-06-06 MED FILL — SODIUM CHLORIDE 0.9 % IV SOLN: 0.9 % | INTRAVENOUS | Qty: 100

## 2023-06-06 MED FILL — CEFTRIAXONE SODIUM 1 G IJ SOLR: 1 g | INTRAMUSCULAR | Qty: 1000

## 2023-06-06 NOTE — Plan of Care (Signed)
Problem: Physical Therapy - Adult  Goal: By Discharge: Performs mobility at highest level of function for planned discharge setting.  See evaluation for individualized goals.  Description: FUNCTIONAL STATUS PRIOR TO ADMISSION: Patient was mod I using rollator at baseline, living at [facility] in ALF. Patient's family endorses frequent falls, with patient possibly concealing additional falls from them. Family endorse steady functional decline over the last few months, with significant decline after GLF resulting in pelvic fx 05/22/2023, after which patient was admitted to [facility] for SNF - now admitted from SNF.    HOME SUPPORT PRIOR TO ADMISSION: The patient lived at ALF and required minimal assistance for some ADLs.    Physical Therapy Goals  Initiated 06/06/2023  1.  Patient will move from supine to sit and sit to supine in bed with minimal assistance within 7 day(s).    2.  Patient will perform sit to stand with contact guard assist within 7 day(s).  3.  Patient will transfer from bed to chair and chair to bed with contact guard assist using the least restrictive device within 7 day(s).  4.  Patient will ambulate with contact guard assist for 30 feet with the least restrictive device within 7 day(s).   Outcome: Progressing  PHYSICAL THERAPY EVALUATION    Patient: Mary Woodward (89 y.o. female)  Date: 06/06/2023  Primary Diagnosis: Nausea and vomiting [R11.2]  Elevated LFTs [R79.89]  AKI (acute kidney injury) (HCC) [N17.9]  Acute cystitis with hematuria [N30.01]  Sepsis without acute organ dysfunction, due to unspecified organism [facility]) [A41.9]  Bacteremia [R78.81]       Precautions: Restrictions/Precautions: Fall Risk                      ASSESSMENT :   DEFICITS/IMPAIRMENTS:   The patient is limited by decreased functional mobility, independence in ADLs, ROM, strength, activity tolerance, safety awareness, coordination, balance, increased pain levels, and increased risk for falls in setting of hospital  admission for sepsis and acute cystitis with recent hx GLF with pelvic fx 05/22/2023. Per ortho - WBAT B LEs in regards to pelvic fx, to be treated conservatively. Patient received asleep in bed with multiple supportive family members present, awakening to voice and with moderate sternal rub. Followed all commands for mobilizing to EOB, sit <> stand transfer, and ambulation of short distance in room using RW for support. Overall requiring up to min-mod A x 1 to stabilize while ambulating, with more assist needed with fatigue as gait progressed. Noted difficulties in bilateral R > L foot clearance, multimodal cues to increase trunk extension, hip flexion, and knee flexion provided to improve clearance, however patient fatigues quickly and progressive anterior lean demonstrated with assist needed to prevent fall. All VSS pre to post activity with patient on RA. Patient is well below mod I PLOF and will benefit from ongoing moderate intensity short-term skilled physical therapy up to 5x/week in order to maximize functional recovery at discharge.     Patient will benefit from skilled intervention to address the above impairments.    Functional Outcome Measure:  The patient scored 13/24 on the AMPAC outcome measure with Cutoff score <=171,2,3 had higher odds of discharging home with home health or need of SNF/IPR.       PLAN :  Recommendations and Planned Interventions:   bed mobility training, transfer training, gait training, therapeutic exercises, neuromuscular re-education, patient and family training/education, and therapeutic activities    Frequency/Duration: Patient will be followed by physical therapy  to address goals, PT Plan of Care: 5 times/week to address goals.    Recommendations for staff mobility and toileting assistance:  Recommend that staff completes patient mobility with assist x1 using gait belt and rolling walker.    Recommend for next PT session: sit to stand transfers and further progression of gait  with existing device    Recommendation for discharge: (in order for the patient to meet his/her long term goals):   Moderate intensity short-term skilled physical therapy up to 5x/week    Other factors to consider for discharge: patient's current support system is unable to meet their requirements for physical assistance, poor safety awareness, high risk for falls, not safe to be alone, and concern for safely navigating or managing the home environment    IF patient discharges home will need the following DME: continuing to assess with progress                SUBJECTIVE:   Patient stated "The pain gets worse as I walk."re pelvic pain    OBJECTIVE DATA SUMMARY:       No past medical history on file.No past surgical history on file.    Home Situation:  Social/Functional History  Lives With: Alone  Type of Home: Assisted living  Home Layout: One level  Home Access: Ramped entrance, Level entry  Bathroom Shower/Tub: Event organiser: Paediatric nurse, Grab bars in shower  Bathroom Accessibility: Accessible  Home Equipment: Rollator  Has the patient had two or more falls in the past year or any fall with injury in the past year?: Yes (multiple falls in the last year, family believes patient is hiding total #)  Receives Help From: Other (Comment)  Prior Level of Assist for ADLs: Needs assistance  Toileting: Independent  Homemaking Responsibilities: No  Prior Level of Assist for Ambulation: Independent community ambulator, with or without device, Independent household ambulator, with or without device  Prior Level of Assist for Transfers: Needs assistance  Active Driver: No  Patient's Herbalist: daughter  Mode of Transportation: Set designer  Occupation: Retired    Biomedical engineer Status:  Orientation  Overall Orientation Status: Within Normal Limits  Orientation Level: Oriented X4  Cognition  Overall Cognitive Status: WFL    Skin: intact where visible    Edema: none noted    Hearing:        Vision/Perceptual:                       Strength:    Strength: Generally decreased, functional (decreased hip flexion AROM/ strength bilaterally, worse R > ; generally reduced strength in bilateral LEs grossly)    Tone & Sensation:   Tone: Normal  Sensation: Intact    Coordination:  Coordination: Generally decreased, functional    Range Of Motion:  AROM: Generally decreased, functional (decreased hip flexion AROM/ strength bilaterally, worse R > L)       Functional Mobility:  Bed Mobility:     Bed Mobility Training  Bed Mobility Training: Yes  Supine to Sit: Maximum assistance;Assist X2;Additional time (HOB elevated, use of bedrail)  Scooting: Contact-guard assistance;Additional time (to EOB)  Transfers:     Art therapist: Yes  Sit to Stand: Contact-guard assistance  Stand to Sit: Contact-guard assistance  Bed to Chair: Minimum assistance (Initial CGA; however, with fatigue benefits from Min A)  Balance:               Balance  Sitting: Intact  Standing: Impaired  Standing - Static: Constant support;Good  Standing - Dynamic: Constant support;Fair (Initial CGA for RW ambulation; however, with fatigue benefits from Min A)  Ambulation/Gait Training:                       Gait  Gait Training: Yes  Overall Level of Assistance: Contact-guard assistance;Minimum assistance;Assist X1  Distance (ft): 10 Feet  Assistive Device: Walker, rolling;Gait belt  Interventions: Safety awareness training;Verbal cues;Tactile cues;Weight shifting training/pressure relief  Base of Support: Narrowed;Center of gravity altered (anterior lean with fatigue)  Speed/Cadence: Slow;Shuffled  Step Length: Right shortened;Left shortened  Gait Abnormalities: Shuffling gait;Trunk sway increased                                                                                                                                                                                                                                           [employer] AM-PAC       Basic Mobility Inpatient Short Form (6-Clicks) Version 2    How much help is needed turning from your back to your side while in a flat bed without using bedrails?: A Lot  How much help is needed moving from lying on your back to sitting on the side of a flat bed without using bedrails?: A Lot  How much help is needed moving to and from a bed to a chair?: A Little  How much help is needed standing up from a chair using your arms?: A Little  How much help is needed walking in hospital room?: A Lot  How much help is needed climbing 3-5 steps with a railing?: Total    AM-PAC Inpatient Mobility Raw Score : 13  AM-PAC Inpatient T-Scale Score : 36.74     Cutoff score <=171,2,3 had higher odds of discharging home with home health or need of SNF/IPR.    1. Emelia Loron, Janeece Riggers, Vinoth Fransico Meadow, Lupe Carney Passek, Thornton Dales. Cassandria Anger.  Validity of the AM-PAC "6-Clicks" Inpatient Daily Activity and Basic Mobility Short Forms. Physical Therapy Mar 2014, 94 (3) 379-391; DOI: 10.2522/ptj.20130199  2. Venetia Night. Association of AM-PAC "6-Clicks" Basic Mobility and Daily Activity Scores With Discharge Destination. Phys Ther. 2021 Apr 4;101(4):pzab043. doi: 10.1093/ptj/pzab043. PMID: 16109604.  3. Herbold J, Rajaraman D, Lubertha Basque, Agayby K, Winslow S. Activity  Measure for Post-Acute Care "6-Clicks" Basic Mobility Scores Predict Discharge Destination After Acute Care Hospitalization in Select Patient Groups: A Retrospective, Observational Study. Arch Rehabil Res Clin Transl. 2022 Jul 16;4(3):100204. doi: 10.1016/j.arrct.1610.960454. PMID: 09811914; PMCID: [id number].  4. Josefina Do, Coster W, Ni P. AM-PAC Short Forms Manual 4.0. Revised 06/2018.                                                                                                                                                                                                                                Pain Rating:  10/10 pelvic pain in weightbearing     Pain Intervention(s):   nursing notified, rest, and repositioning    Activity Tolerance:   Fair , requires frequent rest breaks, and SpO2 stable on room air    After treatment:   Patient left in no apparent distress sitting up in chair, Call bell within reach, Bed/ chair alarm activated, and Caregiver / family present    COMMUNICATION/EDUCATION:   The patient's plan of care was discussed with: occupational therapist and registered nurse    Patient Education  Education Given To: Patient  Education Provided: Role of Therapy;Plan of Care;Transfer Training;Mobility Training;Energy Conservation;Fall Prevention Strategies  Education Method: Verbal;Demonstration  Barriers to Learning: None  Education Outcome: Verbalized understanding;Demonstrated understanding;Continued education needed    Thank you for this referral.  Georgiann Mccoy, PT, DPT  Minutes: 43      Physical Therapy Evaluation Charge Determination   History Examination Presentation Decision-Making   MEDIUM  Complexity : 1-2 comorbidities / personal factors will impact the outcome/ POC  MEDIUM Complexity : 3 Standardized tests and measures addressin body structure, function, activity limitation and / or participation in recreation  LOW Complexity : Stable, uncomplicated  AM-PAC  HIGH    Based on the above components, the patient evaluation is determined to be of the following complexity level: Low

## 2023-06-06 NOTE — Progress Notes (Addendum)
Admission Medication Reconciliation:    Information obtained from:  staff member at [facility] via phone, [name] med list provided by family, RxQuery  RxQuery data available:  Yes - limited    Comments/Recommendations: Updated PTA meds/reviewed patient's allergies.    1)  Patient normally resides at [name], then was admitted at [facility], and then transferred to [facility] for rehab (has resided there for ~6 days) prior to admission to [facility]. Patient's family presented medication list from [name] but noted this would not reflect any medication changes during [facility] stay and at [facility]. Final PTA med list reflects MR interview with [facility] staff member (any med discrepancies between [facility]'s and [name]'s list are called out below for completeness)    2)  Medication changes (since last review):  Added  - Zofran 4mg  BID + prn  - Miralax daily  - Ferrous sulfate on even days  - Estradiol vaginal cream on Mondays (on [name] list as 0.5 gram on Mondays/Fridays)  - APAP scheduled + PRN  - Losartan 25mg  BID (on [name] list as 12.5mg  BID)    Adjusted  - Metoprolol Succ 25mg  --> 50mg  daily  - Preservision --> once daily  - Protonix --> BID  - Amiodarone 200mg  daily (on [name] list as 200mg  BID, [facility] stated dose reduction to 200mg  daily started 06/04/23)    Removed  - Aspirin 81mg  (on [name]'s list but not [facility]'s)  - Atorvastatin (takes Lovastatin)  - Furosemide 40mg  (Bumex 0.5mg  on Tues/Thurs/Sat/Sun on [name]'s list but not [facility]'s list)     RxQuery pharmacy benefit data reflects medications filled and processed through the patient's insurance, however   this data does NOT capture whether the medication was picked up or is currently being taken by the patient.    Allergies:  Patient has no known allergies.    Significant PMH/Disease States: No past medical history on file.  Chief Complaint for this  Admission:    Chief Complaint   Patient presents with    Vomiting     Prior to Admission Medications:   Prior to Admission Medications   Prescriptions Last Dose Informant   Multiple Vitamins-Minerals (PRESERVISION AREDS 2) CHEW  Transfer Papers/EMS, Other   Sig: Take 1 tablet by mouth daily   acetaminophen (TYLENOL) 500 MG tablet  Transfer Papers/EMS, Other   Sig: Take 1 tablet by mouth in the morning, at noon, and at bedtime   acetaminophen (TYLENOL) 500 MG tablet  Transfer Papers/EMS, Other   Sig: Take 1 tablet by mouth every 6 hours as needed for Pain   amiodarone (CORDARONE) 200 MG tablet     Sig: Take 1 tablet by mouth 2 times daily for 3 days, THEN 1 tablet daily for 27 days. 1 tablet twice a day (9 am and 9 pm) through 6/12, then 1 tablet daily (9 am) going forward.   apixaban (ELIQUIS) 2.5 MG TABS tablet  Transfer Papers/EMS, Other   Sig: Take 1 tablet by mouth 2 times daily   empagliflozin (JARDIANCE) 10 MG tablet  Transfer Papers/EMS, Other   Sig: Take 1 tablet by mouth daily   estradiol (ESTRACE) 0.1 MG/GM vaginal cream  Transfer Papers/EMS, Other   Sig: Place 0.5 g vaginally once a week On Mondays   ferrous sulfate (FE TABS 325) 325 (65 Fe) MG EC tablet  Transfer Papers/EMS, Other   Sig: Take 1 tablet by mouth every other day  On even days   losartan (COZAAR) 25 MG tablet  Transfer Papers/EMS, Other   Sig: Take 1 tablet by mouth in the morning and at bedtime   lovastatin (MEVACOR) 10 MG tablet  Transfer Papers/EMS, Other   Sig: Take 1 tablet by mouth nightly   metoprolol succinate (TOPROL XL) 50 MG extended release tablet  Transfer Papers/EMS, Other   Sig: Take 1 tablet by mouth daily   ondansetron (ZOFRAN) 4 MG tablet  Transfer Papers/EMS, Other   Sig: Take 1 tablet by mouth in the morning and at bedtime   ondansetron (ZOFRAN) 4 MG tablet  Transfer Papers/EMS, Other   Sig: Take 1 tablet by mouth every 8 hours as needed for Nausea or Vomiting   pantoprazole (PROTONIX) 40 MG tablet  Transfer Papers/EMS,  Other   Sig: Take 1 tablet by mouth in the morning and at bedtime   polyethylene glycol (MIRALAX) 17 g PACK packet  Transfer Papers/EMS, Other   Sig: Take 17 g by mouth daily      Facility-Administered Medications: None     Please contact the main inpatient pharmacy with any questions or concerns at [phone] and we will direct you to the clinical pharmacist covering this patient's care while in-house.   Unknown Foley, [facility]

## 2023-06-06 NOTE — Plan of Care (Signed)
Problem: Occupational Therapy - Adult  Goal: By Discharge: Performs self-care activities at highest level of function for planned discharge setting.  See evaluation for individualized goals.  Description: FUNCTIONAL STATUS PRIOR TO ADMISSION:  Patient was ambulatory using Rollator.  Recently at [facility] for rehab secondary to pelvic fracture 05/22/2023  Receives Help From: Other (Comment), Prior Level of Assist for ADLs: Needs assistance,  ,  ,  ,  , Toileting: Independent,  , Ambulation Assistance: Needs assistance, Prior Level of Assist for Transfers: Needs assistance, Active Driver: No     HOME SUPPORT: Patient admitted from [facility] rehab, with hopes to return.  Lives at the [facility] at baseline.    Occupational Therapy Goals:  Initiated 06/06/2023  1.  Patient will perform RW grooming with Contact Guard Assist within 7 day(s).  2.  Patient will perform bathing with Minimal Assist within 7 day(s).  3.  Patient will perform RW lower body dressing with Minimal Assist within 7 day(s).  4.  Patient will perform RW toilet transfers with Contact Guard Assist  within 7 day(s).  5.  Patient will perform all aspects of RW toileting with Contact Guard Assist within 7 day(s).    Outcome: Progressing    OCCUPATIONAL THERAPY EVALUATION    Patient: Mary Woodward (89 y.o. female)  Date: 06/06/2023  Primary Diagnosis: Nausea and vomiting [R11.2]  Elevated LFTs [R79.89]  AKI (acute kidney injury) (HCC) [N17.9]  Acute cystitis with hematuria [N30.01]  Sepsis without acute organ dysfunction, due to unspecified organism [facility]) [A41.9]  Bacteremia [R78.81]         Precautions: Fall Risk                  ASSESSMENT :  The patient is limited by decreased strength/endurance, decreased mobility/balance, decreased activity tolerance, decreased full body reaching and high c/o pelvic pain with movement, all of which limit pt's ability to safely complete self-care routine.  Pt noted with difficulty with bed mobility; however,  demonstrates good CGA sit to stand from EOB.  Pt noted with initial CGA during RW ambulation; however, quickly fatigued and required Min A towards end of transfer training.  Pt noted to benefit from Total A for LE dressing.  Pt has supportive family, with all parties reporting desire for pt to return to [facility]  for continued rehab.   Pt benefits from skilled OT during acute hospitalization.    Of note, pt with recent pelvic fracture, 05/22/2023.    Functional Outcome Measure:  The patient scored 30/100 on the Barthel Index outcome measure.         PLAN :  Recommendations and Planned Interventions:   self care training, therapeutic activities, functional mobility training, balance training, therapeutic exercise, endurance activities, and patient education    Frequency/Duration: OT Plan of Care: 5 times/week    Recommendation for discharge: (in order for the patient to meet his/her long term goals):   Moderate intensity short-term skilled occupational therapy up to 5x/week    Other factors to consider for discharge:  pain, low activity tolerance    IF patient discharges home will need the following DME:  DME       SUBJECTIVE:   Patient stated, "I need to sit down."  OBJECTIVE DATA SUMMARY:   No past medical history on file.No past surgical history on file.       Expanded or extensive additional review of patient history:   Social/Functional History  Lives With: Alone  Type of Home: Assisted living  Home Layout: One level  Home Access: Ramped entrance, Level entry  Bathroom Shower/Tub: Event organiser: Paediatric nurse, Grab bars in shower  Bathroom Accessibility: Accessible  Home Equipment: Rollator  Has the patient had two or more falls in the past year or any fall with injury in the past year?: Yes (multiple falls in the last year, family believes patient is hiding total #)  Receives Help From: Other (Comment)  Prior Level of Assist for ADLs: Needs assistance  Toileting: Independent  Homemaking  Responsibilities: No  Prior Level of Assist for Ambulation: Independent community ambulator, with or without device, Independent household ambulator, with or without device  Prior Level of Assist for Transfers: Needs assistance  Active Driver: No  Patient's Driver Info: daughter  Mode of Transportation: Set designer  Occupation: Retired      Higher education careers adviser Dominance: right     EXAMINATION OF PERFORMANCE DEFICITS:    Cognitive/Behavioral Status:  Orientation  Overall Orientation Status: Within Normal Limits  Orientation Level: Oriented X4  Cognition  Overall Cognitive Status: WNL    Range of Motion:   AROM: Generally decreased, functional  PROM: Generally decreased, functional      Strength:  Strength: Generally decreased, functional      Coordination:  Coordination: Generally decreased, functional            Tone & Sensation:   Tone: Normal  Sensation: Intact          Functional Mobility and Transfers for ADLs:    Bed Mobility:     Bed Mobility Training  Bed Mobility Training: Yes  Supine to Sit: Maximum assistance;Assist X2;Additional time (HOB elevated, use of bedrail)  Scooting: Contact-guard assistance;Additional time (to EOB)    Transfers:      Art therapist: Yes  Sit to Stand: Contact-guard assistance  Stand to Sit: Contact-guard assistance  Bed to Chair: Minimum assistance (Initial CGA; however, with fatigue benefits from Min A)          Functional Mobility: Minimal assistance          Balance:      Balance  Sitting: Intact  Standing: Impaired  Standing - Static: Constant support;Good  Standing - Dynamic: Constant support;Fair (Initial CGA for RW ambulation; however, with fatigue benefits from Min A)      ADL Assessment:          Feeding: Setup       Grooming: Moderate assistance  Grooming Skilled Clinical Factors: seated; Mod A secondary to decreased activity tolerance    UE Bathing: Moderate assistance            LE Bathing: Maximum assistance       UE Dressing: Minimal assistance       LE Dressing:  Dependent/Total       Toileting: Maximum assistance            Functional Mobility: Minimal assistance            ADL Intervention and task modifications:        Patient instructed and indicated understanding the benefits of maintaining activity tolerance, functional mobility, and independence with self care tasks during acute stay  to ensure safe return home and to baseline. Encouraged patient to increase frequency and duration OOB, be out of bed for all meals, perform daily ADLs (as approved by RN/MD regarding bathing etc), and performing functional mobility to/from bathroom.    Pt educated on safe transfer techniques, with specific emphasis on proper hand  placement to push up from seated surface rather than attempt to pull self up, fully positioning self in-front of desired seated location, feeling chair on back of legs and reaching back with 1-2 UE to slowly lower self to seated position.                                                                                                                                                                                                                                     Barthel Index:    Barthel Index Scale  Feeding: Needs help, i.e. for cutting  Bathing: Cannot perform activity  Grooming: Cannot perform activity  Dressing: Cannot perform activity  Bowel Control: Occasional accidents or needs help with device  Bladder Control: Occasional accidents or needs help with device  Toilet Transfers: Needs help for balance, handling clothes or toilet paper  Chair/Bed Trannsfers: Minimum assistance or supervision required  Ambulation: Cannot perform activity  Stairs: Cannot perform activity  Total Barthel Index Score: 30       The Barthel ADL Index: Guidelines  1. The index should be used as a record of what a patient does, not as a record of what a patient could do.  2. The main aim is to establish degree of independence from any help, physical or verbal, however minor and for  whatever reason.  3. The need for supervision renders the patient not independent.  4. A patient's performance should be established using the best available evidence. Asking the patient, friends/relatives and nurses are the usual sources, but direct observation and common sense are also important. However direct testing is not needed.  5. Usually the patient's performance over the preceding 24-48 hours is important, but occasionally longer periods will be relevant.  6. Middle categories imply that the patient supplies over 50 per cent of the effort.  7. Use of aids to be independent is allowed.    Score Interpretation (from Sinoff 1997)   80-100 Independent   60-79 Minimally independent   40-59 Partially dependent   20-39 Very dependent   <20 Totally dependent     -Mahoney, F.l., Barthel, D.W. (1965). Functional evaluation: the Barthel Index. [place] [address] [place] (14)2.  -Sinoff, G., Ore, L. (1997). The Barthel activities of daily living index: self-reporting versus actual performance in the old (> or = 75 years). Journal of American Geriatric Society 45(7), [phone].   Kenn File [place], J.J.M.F, [place],  Huntley Estelle, A.J. (1999). Measuring the change in disability after inpatient rehabilitation; comparison of the responsiveness of the Barthel Index and Functional Independence Measure. Journal of Neurology, Neurosurgery, and Psychiatry, 66(4), [phone].  Dawson Bills, N.J.A, Scholte op [place],  W.J.M, & Koop[place], M.A. (2004) Assessment of post-stroke quality of life in cost-effectiveness studies: The usefulness of the Barthel Index and the EuroQoL-5D. Quality of Life Research, 13, 427-43                                                                                                                                                                                                                                 Pain Rating:  High c/o pelvic pain with movement, did not quantify   Pain Intervention(s):   nursing  notified and addressing, rest, and repositioning    Activity Tolerance:   Poor and requires frequent rest breaks    After treatment:   Patient left in no apparent distress sitting up in chair, Call bell within reach, Bed/ chair alarm activated, and Caregiver / family present    COMMUNICATION/EDUCATION:   The patient's plan of care was discussed with: physical therapist, registered nurse, and case manager    Patient Education  Education Given To: Patient;Family  Education Provided: Role of Therapy;Plan of Care;ADL Adaptive Strategies;Precautions;Energy Arts administrator  Education Method: Demonstration;Verbal  Barriers to Learning: None  Education Outcome: Verbalized understanding;Demonstrated understanding    Thank you for this referral.  Glenis Smoker, OT  Minutes: 40    Occupational Therapy Evaluation Charge Determination   History Examination Decision-Making   LOW Complexity : Brief history review  LOW Complexity: 1-3 Performance deficits relating to physical, cognitive, or psychosocial skills that result in activity limitations and/or participation restrictions LOW Complexity: No comorbidities that affect functional and  no verbal  or physical assist needed to complete eval tasks   Based on the above components, the patient evaluation is determined to be of the following complexity level: Low

## 2023-06-06 NOTE — Progress Notes (Signed)
 [facility]                                                                                          Hospitalist Progress Note  Richelle Ito, [place]  Answering service: [phone] OR [phone] from in house phone        Date of Service:  06/06/2023  NAME:  Mary Woodward  DOB:  04-Aug-1927  MRN:  [id number]       Admission Summary:     Mary Woodward is a 89 y.o. female with with pmh of paroxysmal atrial fibrillation, HFpEF, pulmonary edema, hypertension, hyperlipidemia, PE, osteoarthritis, osteoporosis who presented to ed with complaints of abdominal pain, nausea and vomiting. Some cramping abdominal pain and constipation over the last week with nausea and non-bloody, non bilious emesis over the last 2 days.  Also with poor po intake, malaise and fatigue. Suffered a recent fall with a pelvic fracture; treated conservatively. Has had difficulty ambulating as a result.     The patient denies any fever, chills, chest pain, cough, congestion, recent illness, palpitations, or dysuria.  Remarkable vitals on ER Presentation: vss  Labs Remarkable for: wbc 25.6, hgb 8.6, na 128, cr 2.73, t-bili 2.1, alt 909, ast 926, alk phos 216, alb 2.6, UA: grossly infected.  ER Images: ct abd et pelvis: no acute process. Sludge in gallbladder  ER Rx: vanc, cefepime, 2.3l ns bolus, zofran       Interval history / Subjective:     Seen and examined this am. Endorses some mild lower abd pain, denies n/v but family bedside notes she was dry heaving this morning. Last BM per [facility]: 3 days ago  MRCP pending   Consult ID for bacteremia      Assessment & Plan:      Acute Cystitis  -continue rocephin 1g daily  -follow urine cultures     Cholestasis / Hyperbilirubinemia / Transaminitis  -gallbladder sludge on ct abd et pelvis  -RUQ [place] with hepatomegaly, GB polps  - MRCP pending  - hepatitis panel negative  - GI consult, cleared for CLD    Bacteremia   - Bcx with GNR  - PCR indicating enterobacteriaceae and E  coli    - C/w Rocephin  - Source UTI vs cholestasis?  - Consult ID     Constipation  -miralax and senna-kot  -mivf    NAGMA  - likely hyperchloremic metabolic acidosis due to NS infusion given CKD status, will switch to LR  - repeat labs tomorrow      Anemia  -- normocytic with hgb 8.6  --Fe 13, Iron% sat 6, folate 28.3, ferritin 2983  - holding iron supplements for now given pt is constipated      HFpEF  -compensated. Gentle iv hydration  -gdmt as tolerated     AKI on CKD III  -cr 2.73 -> 2.08  w/ baseline 2  -cont gentle ivfs'  -bmp daily     Hyponatremia   - improved, though seems somewhat chronic   - repeat labs tomorrow     Afib / Hx of PE  -not anticoagulated  HTN  -low labile bps  -hold home meds     GERD / Dyslipidemia   -pta home meds          Code status: DNR  Prophylaxis: SCDs  Care Plan discussed with: pt, nurse, family, pharmacy  Anticipated Disposition: TBD  Inpatient  Cardiac monitoring: Remote Telemetry     Principal Problem:    Nausea and vomiting  Active Problems:    Bacteremia  Resolved Problems:    * No resolved hospital problems. *         Social Determinants of Health     Tobacco Use: Unknown (05/18/2023)    Received from [facility]    Patient History     Smoking Tobacco Use: Never     Smokeless Tobacco Use: Unknown     Passive Exposure: Not on file   Alcohol Use: Not At Risk (10/05/2022)    AUDIT-C     Frequency of Alcohol Consumption: Never     Average Number of Drinks: Patient does not drink     Frequency of Binge Drinking: Never   Financial Resource Strain: Not on file   Food Insecurity: No Food Insecurity (10/06/2022)    Hunger Vital Sign     Worried About Running Out of Food in the Last Year: Never true     Ran Out of Food in the Last Year: Never true   Transportation Needs: No Transportation Needs (10/06/2022)    PRAPARE - Therapist, art (Medical): No     Lack of Transportation (Non-Medical): No   Physical Activity: Not on file   Stress: Not on file   Social  Connections: Not on file   Intimate Partner Violence: Not on file   Depression: Not on file   Housing Stability: Low Risk  (10/06/2022)    Housing Stability Vital Sign     Unable to Pay for Housing in the Last Year: No     Number of Places Lived in the Last Year: 2     Unstable Housing in the Last Year: No   Interpersonal Safety: Not At Risk (10/06/2022)    Interpersonal Safety Domain Source: IP Abuse Screening     Physical abuse: Denies     Verbal abuse: Denies     Emotional abuse: Denies     Financial abuse: Denies     Sexual abuse: Denies   Utilities: Not At Risk (10/06/2022)    AHC Utilities     Threatened with loss of utilities: No       Review of Systems:   Pertinent items are noted in HPI.       Vital Signs:    Last 24hrs VS reviewed since prior progress note. Most recent are:  Vitals:    06/06/23 1156   BP:    Pulse: 71   Resp:    Temp:    SpO2:          Intake/Output Summary (Last 24 hours) at 06/06/2023 1255  Last data filed at 06/05/2023 2215  Gross per 24 hour   Intake --   Output 250 ml   Net -250 ml        Physical Examination:     I had a face to face encounter with this patient and independently examined them on 06/06/2023 as outlined below:          General : alert x 3, awake, no acute distress,   HEENT: PEERL, EOMI, moist mucus membrane  Neck:  supple, no JVD, no meningeal signs  Chest: Clear to auscultation bilaterally   CVS: S1 S2 heard, Capillary refill less than 2 seconds  Abd: soft/ non tender, non distended, BS physiological,   Ext: no clubbing, no cyanosis, no edema, brisk 2+ DP pulses  Neuro/Psych: pleasant mood and affect, CN 2-12 grossly intact, sensory grossly within normal limit  Skin: warm     Data Review:    Review and/or order of clinical lab test      I have personally and independently reviewed all pertinent labs, diagnostic studies, imaging, and have provided independent interpretation of the same.     Labs:     Recent Labs     06/05/23  2121 06/06/23  0553   WBC 25.6* 21.5*   HGB 8.6* 8.6*    HCT 27.2* 27.8*   PLT 341 342     Recent Labs     06/05/23  2121 06/06/23  0553   NA 128* 132*   K 4.8 4.5   CL 100 107   CO2 21 16*   BUN 47* 41*     Recent Labs     06/05/23  2121 06/06/23  0553   ALT 909* 919*   GLOB 4.0 3.8     No results for input(s): "INR", "APTT" in the last 72 hours.    Invalid input(s): "PTP"   Recent Labs     06/06/23  0209   TIBC 236*      No results found for: "RBCF"   No results for input(s): "PH", "PCO2", "PO2" in the last 72 hours.  No results for input(s): "CPK" in the last 72 hours.    Invalid input(s): "CPKMB", "CKNDX", "TROIQ"  No results found for: "CHOL", "CHLST", "CHOLV", "HDL", "HDLC", "LDL", "LDLC"  No results found for: "GLUCPOC"  @LABUA @    Notes reviewed from all clinical/nonclinical/nursing services involved in patient's clinical care. Care coordination discussions were held with appropriate clinical/nonclinical/ nursing providers based on care coordination needs.         Patients current active Medications were reviewed, considered, added and adjusted based on the clinical condition today.      Home Medications were reconciled to the best of my ability given all available resources at the time of admission. Route is PO if not otherwise noted.      Admission Status:[id number]:::1}      Medications Reviewed:     Current Facility-Administered Medications   Medication Dose Route Frequency    polyethylene glycol (GLYCOLAX) packet 17 g  17 g Oral BID    sennosides-docusate sodium (SENOKOT-S) 8.6-50 MG tablet 2 tablet  2 tablet Oral BID    atorvastatin (LIPITOR) tablet 10 mg  10 mg Oral Daily    pantoprazole (PROTONIX) tablet 40 mg  40 mg Oral QAM AC    sodium chloride flush 0.9 % injection 5-40 mL  5-40 mL IntraVENous 2 times per day    sodium chloride flush 0.9 % injection 5-40 mL  5-40 mL IntraVENous PRN    0.9 % sodium chloride infusion   IntraVENous PRN    ondansetron (ZOFRAN-ODT) disintegrating tablet 4 mg  4 mg Oral Q8H PRN    Or    ondansetron (ZOFRAN) injection 4 mg  4  mg IntraVENous Q6H PRN    polyethylene glycol (GLYCOLAX) packet 17 g  17 g Oral Daily PRN    acetaminophen (TYLENOL) tablet 650 mg  650 mg Oral Q6H PRN    Or    acetaminophen (TYLENOL)  suppository 650 mg  650 mg Rectal Q6H PRN    0.9 % sodium chloride infusion   IntraVENous Continuous    sodium chloride 0.9 % bolus 100 mL  100 mL IntraVENous Once    [START ON 06/07/2023] cefTRIAXone (ROCEPHIN) 2,000 mg in sterile water 20 mL IV syringe  2,000 mg IntraVENous Q24H     ______________________________________________________________________  EXPECTED LENGTH OF STAY: Unable to retrieve estimated LOS  ACTUAL LENGTH OF STAY:          0                 Richelle Ito, PA-C

## 2023-06-06 NOTE — Consults (Signed)
 Infectious Disease Consult Note    Assessment / Plan:       89 y/o female with sepsis with E coli bacteremia and transaminitis.    Suspected urinary source and pyelonephritis.    Ceftriaxone - dose increased to 2g IV q24hrs.    Follow urine culture to see if it also grows the same E coli.    Follow MRCP.    Trend liver enzymes and bilirubin.    If able to check PVR, can perform, though utility limited due to urinary incontinence.         Reason for Consult: E coli bacteremia  Date of Consultation: June 06, 2023  Date of Admission: 06/05/2023  Referring Physician: Dr. Christie Beckers    HPI:    89 y/o female with frequent UTIs and chronic constipation issues with recent admission to [facility] from 1/21-1/27/25 for fall and pelvic fracture managed conservatively admitted to [facility] for malaise, abdominal pain, nausea, non-bloody non-bilious emesis for 2 days prior to admission.  Found to have WBC 25 and transaminitis no CVA tenderness nor suprapubic tenderness which per family is typical for patient that she does not typically have "classic" urinary symptoms but instead gets septic.  UA with pyuria and E coli on blood cultures negative resistance genes and treated with Ceftriaxone and pending sensitivities.  Underwent MRCP today - evidence of pyelonephritis.    Past Medical History:  No past medical history on file.      Surgical History:  No past surgical history on file.      Family History:   No family history on file.      Social History:     Living Situation:  Tobacco:  Alcohol:  Illicit Drugs:  Sexual History:   Travel History  Exposures:   Outdoor/Hiking: denied  Animal/Pet: Dogs/ Brewing technologist: denied  Hot Tub: denied   Brackish/stagnant exposure: denied  TB exposure: denied  Sick Contacts: denied     Allergies:  No Known Allergies      Review of Systems:     Gen: Negative for chills, fevers, weight loss, weight gain   HEENT: Negative for headache, vision changes, ear ache or discharge, tingling,  nasal discharge,  swelling, lumps in neck, sores on tongue   CV:  Negative for chest pain, dyspnea on exertion, leg edema   Lungs: Negative for shortness of breath, cough, wheezing   Abdomen: per HPI   Genitourinary: Negative for genital pain or genital discharge     Neuro: Negative for headache, numbness, tingling, extremity weakness,  syncope, seizures    Skin: Negative for rash, sores/open wounds   Musculoskeletal: Negative for joint pain, joint swelling, joint erythema    Endocrine: Negative for high or low blood sugars, heat or cold intolerance    Psych: Negative for manic behavior     Medications:  No current facility-administered medications on file prior to encounter.     Current Outpatient Medications on File Prior to Encounter   Medication Sig Dispense Refill    lovastatin (MEVACOR) 10 MG tablet Take 1 tablet by mouth nightly      metoprolol succinate (TOPROL XL) 50 MG extended release tablet Take 1 tablet by mouth daily Hold for SBP<100      pantoprazole (PROTONIX) 40 MG tablet Take 1 tablet by mouth in the morning and at bedtime      ondansetron (ZOFRAN) 4 MG tablet Take 1 tablet by mouth in the morning and at bedtime      ondansetron (  ZOFRAN) 4 MG tablet Take 1 tablet by mouth every 8 hours as needed for Nausea or Vomiting      polyethylene glycol (MIRALAX) 17 g PACK packet Take 17 g by mouth daily      ferrous sulfate (FE TABS 325) 325 (65 Fe) MG EC tablet Take 1 tablet by mouth every other day On even days      estradiol (ESTRACE) 0.1 MG/GM vaginal cream Place 0.5 g vaginally once a week On Mondays      acetaminophen (TYLENOL) 500 MG tablet Take 1 tablet by mouth in the morning, at noon, and at bedtime      acetaminophen (TYLENOL) 500 MG tablet Take 1 tablet by mouth every 6 hours as needed for Pain      losartan (COZAAR) 25 MG tablet Take 1 tablet by mouth in the morning and at bedtime Hold for SBP<100      amiodarone (CORDARONE) 200 MG tablet Take 1 tablet by mouth 2 times daily for 3 days, THEN 1 tablet daily for 27  days. 1 tablet twice a day (9 am and 9 pm) through 6/12, then 1 tablet daily (9 am) going forward. 33 tablet 0    apixaban (ELIQUIS) 2.5 MG TABS tablet Take 1 tablet by mouth 2 times daily 60 tablet 0    empagliflozin (JARDIANCE) 10 MG tablet Take 1 tablet by mouth daily 30 tablet 0    Multiple Vitamins-Minerals (PRESERVISION AREDS 2) CHEW Take 1 tablet by mouth daily             Physical Exam:    Vitals: Patient Vitals for the past 24 hrs:   Temp Pulse Resp BP SpO2   06/06/23 1156 -- 71 -- -- --   06/06/23 1000 -- 75 -- -- --   06/06/23 0827 -- 83 -- (!) 113/44 95 %   06/06/23 0800 -- 85 -- -- --   06/06/23 0345 98.3 F (36.8 C) 76 22 121/64 94 %   06/06/23 0215 98.1 F (36.7 C) 78 24 128/75 92 %   06/06/23 0200 -- 80 25 (!) 130/50 90 %   06/06/23 0100 -- 79 (!) 34 136/64 (!) 70 %   06/06/23 0030 -- 78 24 130/75 94 %   06/05/23 2345 98.3 F (36.8 C) 74 24 132/89 91 %   06/05/23 2215 98.4 F (36.9 C) 98 -- (!) 112/98 94 %   06/05/23 2015 97.6 F (36.4 C) 61 18 (!) 99/51 94 %     GEN: NAD  HEENT: Normocephalic, atraumatic  CV: normal rate  Lungs: Nl effort  Abdomen: soft, non distended, non tender no b/l CVA nor suprapubic TTP  Genitourinary: no foley  Extremities: no edema  Neuro: Alert, oriented to time, place and situation, moves all extremities to commands, verbal   Skin: no rash  Psych: good affect, good eye contact, non tearful     Central Line:        Labs:   Recent Results (from the past 24 hour(s))   EKG 12 Lead    Collection Time: 06/05/23  9:13 PM   Result Value Ref Range    Ventricular Rate 63 BPM    Atrial Rate 63 BPM    P-R Interval 234 ms    QRS Duration 160 ms    Q-T Interval 502 ms    QTc Calculation (Bazett) 513 ms    R Axis -20 degrees    T Axis 122 degrees    Diagnosis  Sinus rhythm with 1st degree AV block  Left bundle branch block  Abnormal ECG  When compared with ECG of 07-Oct-2022 10:22,  PR interval has increased  Questionable change in QRS axis     Comprehensive Metabolic Panel     Collection Time: 06/05/23  9:21 PM   Result Value Ref Range    Sodium 128 (L) 136 - 145 mmol/L    Potassium 4.8 3.5 - 5.1 mmol/L    Chloride 100 97 - 108 mmol/L    CO2 21 21 - 32 mmol/L    Anion Gap 7 2 - 12 mmol/L    Glucose 112 (H) 65 - 100 mg/dL    BUN 47 (H) 6 - 20 MG/DL    Creatinine 1.61 (H) 0.55 - 1.02 MG/DL    BUN/Creatinine Ratio 17 12 - 20      Est, Glom Filt Rate 16 (L) >60 ml/min/1.45m2    Calcium 8.4 (L) 8.5 - 10.1 MG/DL    Total Bilirubin 2.1 (H) 0.2 - 1.0 MG/DL    ALT 096 (H) 12 - 78 U/L    AST 926 (H) 15 - 37 U/L    Alk Phosphatase 216 (H) 45 - 117 U/L    Total Protein 6.6 6.4 - 8.2 g/dL    Albumin 2.6 (L) 3.5 - 5.0 g/dL    Globulin 4.0 2.0 - 4.0 g/dL    Albumin/Globulin Ratio 0.7 (L) 1.1 - 2.2     CBC with Auto Differential    Collection Time: 06/05/23  9:21 PM   Result Value Ref Range    WBC 25.6 (H) 3.6 - 11.0 K/uL    RBC 3.27 (L) 3.80 - 5.20 M/uL    Hemoglobin 8.6 (L) 11.5 - 16.0 g/dL    Hematocrit 04.5 (L) 35.0 - 47.0 %    MCV 83.2 80.0 - 99.0 FL    MCH 26.3 26.0 - 34.0 PG    MCHC 31.6 30.0 - 36.5 g/dL    RDW 40.9 (H) 81.1 - 14.5 %    Platelets 341 150 - 400 K/uL    MPV 10.0 8.9 - 12.9 FL    Nucleated RBCs 0.0 0 PER 100 WBC    nRBC 0.00 0.00 - 0.01 K/uL    Neutrophils % 87 (H) 32 - 75 %    Lymphocytes % 2 (L) 12 - 49 %    Monocytes % 11 5 - 13 %    Eosinophils % 0 0 - 7 %    Basophils % 0 0 - 1 %    Immature Granulocytes % 0 %    Neutrophils Absolute 22.27 (H) 1.8 - 8.0 K/UL    Lymphocytes Absolute 0.51 (L) 0.8 - 3.5 K/UL    Monocytes Absolute 2.82 (H) 0.0 - 1.0 K/UL    Eosinophils Absolute 0.00 0.0 - 0.4 K/UL    Basophils Absolute 0.00 0.0 - 0.1 K/UL    Immature Granulocytes Absolute 0.00 K/UL    Differential Type MANUAL      Platelet Comment CLUMPED PLATELETS      RBC Comment ANISOCYTOSIS  2+        RBC Comment HYPOCHROMIA  1+        RBC Comment POLYCHROMASIA  PRESENT       Extra Tubes Hold    Collection Time: 06/05/23  9:21 PM   Result Value Ref Range    Specimen HOld 1RED,1BLU,1PST      Comment:        Add-on orders for  these samples will be processed based on acceptable specimen integrity and analyte stability, which may vary by analyte.   Lipase    Collection Time: 06/05/23  9:21 PM   Result Value Ref Range    Lipase 27 13 - 75 U/L   Acetaminophen Level    Collection Time: 06/05/23  9:21 PM   Result Value Ref Range    Acetaminophen Level 8 (L) 10 - 30 ug/mL   Procalcitonin    Collection Time: 06/05/23  9:21 PM   Result Value Ref Range    Procalcitonin 2.68 ng/mL   Hepatitis Panel, Acute    Collection Time: 06/05/23  9:21 PM   Result Value Ref Range    Hep A IgM NONREACTIVE NR      -        Hepatitis B Surface Ag 0.15 Index    Hep B S Ag Interp Negative NEG      -        Hep B Core Ab, IgM NONREACTIVE NR      -        Hepatitis C Ab 0.13 Index    Hep C Ab Interp NONREACTIVE NR     Troponin    Collection Time: 06/05/23  9:21 PM   Result Value Ref Range    Troponin, High Sensitivity 28 0 - 51 ng/L   Ammonia    Collection Time: 06/05/23  9:31 PM   Result Value Ref Range    Ammonia 10 <32 UMOL/L   Blood Culture 1    Collection Time: 06/05/23  9:50 PM    Specimen: Blood   Result Value Ref Range    Special Requests NO SPECIAL REQUESTS  RIGHT  Antecubital        Culture (A)       Gram negative rods GROWING IN 1 OF 2 BOTTLES DRAWN SITE = R AC    Culture       (NOTE) GNR CALLED TO AND READ BACK BY [place], RN AT [phone] ON 06/06/23. JMP   Blood Culture 2    Collection Time: 06/05/23 10:13 PM    Specimen: Blood   Result Value Ref Range    Special Requests NO SPECIAL REQUESTS  RIGHT  Antecubital        Culture (A)       PROBABLE Escherichia coli GROWING IN 1 OF 2 BOTTLES DRAWN SITE = R AC    Culture       (NOTE) GNR CALLED TO AND READ BACK BY [place], RN AT [phone] ON 06/06/23. JMP   Lactic Acid    Collection Time: 06/05/23 10:13 PM   Result Value Ref Range    Lactic Acid, Plasma 1.2 0.4 - 2.0 MMOL/L   Culture, Blood, PCR ID Panel    Collection Time: 06/05/23 10:13 PM    Specimen: Blood   Result Value Ref Range     Accession Number [id number]     Enterococcus faecalis by PCR Not detected NOTD      Enterococcus faecium by PCR Not detected NOTD      Listeria monocytogenes by PCR Not detected NOTD      STAPHYLOCOCCUS Not detected NOTD      Staphylococcus Aureus Not detected NOTD      Staphylococcus epidermidis by PCR Not detected NOTD      Staphylococcus lugdunensis by PCR Not detected NOTD      STREPTOCOCCUS Not detected NOTD      Streptococcus agalactiae (Group B) Not detected  NOTD      Strep pneumoniae Not detected NOTD      Strep pyogenes,(Grp. A) Not detected NOTD      Acinetobacter calcoac baumannii complex by PCR Not detected NOTD      Bacteroides fragilis by PCR Not detected NOTD      Enterobacteriaceae by PCR Detected (A) NOTD      Enterobacter cloacae complex by PCR Not detected NOTD      Escherichia Coli Detected (A) NOTD      Klebsiella aerogenes by PCR Not detected NOTD      Klebsiella oxytoca by PCR Not detected NOTD      Klebsiella pneumoniae group by PCR Not detected NOTD      Proteus by PCR Not detected NOTD      Salmonella species by PCR Not detected NOTD      Serratia marcescens by PCR Not detected NOTD      Haemophilus Influenzae by PCR Not detected NOTD      Neisseria meningitidis by PCR Not detected NOTD      Pseudomonas aeruginosa Not detected NOTD      Stenotrophomonas maltophilia by PCR Not detected NOTD      Candida albicans by PCR Not detected NOTD      Candida auris by PCR Not detected NOTD      Candida glabrata Not detected NOTD      Candida krusei by PCR Not detected NOTD      Candida parapsilosis by PCR Not detected NOTD      Candida tropicalis by PCR Not detected NOTD      Cryptococcus neoformans/gattii by PCR Not detected NOTD      Resistant gene targets        Resistant gene ctx-m by PCR Not detected NOTD      Resistant gene imp by PCR Not detected NOTD      KPC (Carbapenem resistance gene) Not detected NOTD      Colistin Resistance mcr-1 gene by PCR Not detected NOTD      Resistant gene ndm by PCR  Not detected NOTD      Resistant gene oxa-48-like by pcr Not detected NOTD      Resistant gene vim by PCR Not detected NOTD      [employer] test comment        False positive results may rarely occur. Correlate with clinical,epidemiologic, and other laboratory findings   Urinalysis with Reflex to Culture    Collection Time: 06/05/23 11:24 PM    Specimen: Urine   Result Value Ref Range    Color, UA DARK YELLOW      Appearance CLOUDY (A) CLEAR      Specific Gravity, UA 1.020 1.003 - 1.030      pH, Urine 5.5 5.0 - 8.0      Protein, UA 100 (A) NEG mg/dL    Glucose, Ur 161 (A) NEG mg/dL    Ketones, Urine Negative NEG mg/dL    Bilirubin, Urine Negative NEG      Blood, Urine SMALL (A) NEG      Urobilinogen, Urine 1.0 0.2 - 1.0 EU/dL    Nitrite, Urine Negative NEG      Leukocyte Esterase, Urine MODERATE (A) NEG      WBC, UA >100 (H) 0 - 4 /hpf    RBC, UA 0-5 0 - 5 /hpf    Epithelial Cells, UA FEW FEW /lpf    BACTERIA, URINE 4+ (A) NEG /hpf    Urine Culture if Indicated  URINE CULTURE ORDERED (A) CNI      Hyaline Casts, UA 2-5 0 - 5 /lpf   COVID-19 & Influenza Combo    Collection Time: 06/06/23 12:35 AM    Specimen: Nasopharyngeal   Result Value Ref Range    Source Nasopharyngeal      SARS-CoV-2, PCR Not detected NOTD      Rapid Influenza A By PCR Not detected NOTD      Rapid Influenza B By PCR Not detected NOTD     Iron and TIBC    Collection Time: 06/06/23  2:09 AM   Result Value Ref Range    Iron 13 (L) 35 - 150 ug/dL    TIBC 098 (L) 119 - 147 ug/dL    Iron % Saturation 6 (L) 20 - 50 %   Ferritin    Collection Time: 06/06/23  2:09 AM   Result Value Ref Range    Ferritin 2,983 (H) 8 - 252 NG/ML   Vitamin B12 & Folate    Collection Time: 06/06/23  2:09 AM   Result Value Ref Range    Vitamin B-12 957 193 - 986 pg/mL    Folate 28.3 (H) 5.0 - 21.0 ng/mL   Lactic Acid    Collection Time: 06/06/23  5:53 AM   Result Value Ref Range    Lactic Acid, Plasma 1.6 0.4 - 2.0 MMOL/L   Comprehensive Metabolic Panel    Collection Time:  06/06/23  5:53 AM   Result Value Ref Range    Sodium 132 (L) 136 - 145 mmol/L    Potassium 4.5 3.5 - 5.1 mmol/L    Chloride 107 97 - 108 mmol/L    CO2 16 (L) 21 - 32 mmol/L    Anion Gap 9 2 - 12 mmol/L    Glucose 97 65 - 100 mg/dL    BUN 41 (H) 6 - 20 MG/DL    Creatinine 8.29 (H) 0.55 - 1.02 MG/DL    BUN/Creatinine Ratio 20 12 - 20      Est, Glom Filt Rate 22 (L) >60 ml/min/1.5m2    Calcium 7.9 (L) 8.5 - 10.1 MG/DL    Total Bilirubin 2.0 (H) 0.2 - 1.0 MG/DL    ALT 562 (H) 12 - 78 U/L    AST 929 (H) 15 - 37 U/L    Alk Phosphatase 206 (H) 45 - 117 U/L    Total Protein 6.3 (L) 6.4 - 8.2 g/dL    Albumin 2.5 (L) 3.5 - 5.0 g/dL    Globulin 3.8 2.0 - 4.0 g/dL    Albumin/Globulin Ratio 0.7 (L) 1.1 - 2.2     CBC with Auto Differential    Collection Time: 06/06/23  5:53 AM   Result Value Ref Range    WBC 21.5 (H) 3.6 - 11.0 K/uL    RBC 3.23 (L) 3.80 - 5.20 M/uL    Hemoglobin 8.6 (L) 11.5 - 16.0 g/dL    Hematocrit 13.0 (L) 35.0 - 47.0 %    MCV 86.1 80.0 - 99.0 FL    MCH 26.6 26.0 - 34.0 PG    MCHC 30.9 30.0 - 36.5 g/dL    RDW 86.5 (H) 78.4 - 14.5 %    Platelets 342 150 - 400 K/uL    MPV 10.2 8.9 - 12.9 FL    Nucleated RBCs 0.0 0 PER 100 WBC    nRBC 0.00 0.00 - 0.01 K/uL    Neutrophils % 91.6 (H) 32.0 - 75.0 %  Lymphocytes % 2.2 (L) 12.0 - 49.0 %    Monocytes % 4.7 (L) 5.0 - 13.0 %    Eosinophils % 0.0 0.0 - 7.0 %    Basophils % 0.2 0.0 - 1.0 %    Immature Granulocytes % 1.3 (H) 0.0 - 0.5 %    Neutrophils Absolute 19.69 (H) 1.80 - 8.00 K/UL    Lymphocytes Absolute 0.47 (L) 0.80 - 3.50 K/UL    Monocytes Absolute 1.01 (H) 0.00 - 1.00 K/UL    Eosinophils Absolute 0.00 0.00 - 0.40 K/UL    Basophils Absolute 0.04 0.00 - 0.10 K/UL    Immature Granulocytes Absolute 0.28 (H) 0.00 - 0.04 K/UL    Differential Type SMEAR SCANNED      Platelet Comment CLUMPED PLATELETS      RBC Comment ANISOCYTOSIS  2+           Microbiology Data:       Blood: E coli 2/2 sets with 1/2 bottles both sets no noted resistance genes      Urine:  pending    Imaging: CT A/P with biliary sludge no stones nor obvious renal nor bladder pathology    Thank you Dr. Christie Beckers for the opportunity to participate in the care of this patient. Please contact with questions or concerns.     A total time of 60 minutes was spent on today's encounter.  Greater than 50% of the time was spent on the following:  Preparing for visit and chart review.  Obtaining and/or reviewing separately obtained history  Performing a medically appropriate exam and/or evaluation  Counseling and educating a patient/family/caregiver as noted above  Placing relevant orders  Referring and communicating with other professionals (not separately reported)  Independently interpreting results (not separately reported) and communicating results to the patient/family/caregiver  Care coordination (not separately reported) as noted above  Documenting clinical information in the electronic health records (e.g. problem list, visit note) on the day of the encounter

## 2023-06-06 NOTE — ED Notes (Signed)
ED TO INPATIENT SBAR HANDOFF    Patient Name: Mary Woodward   DOB:  [birth year]  89 y.o.   MRN:  [id number]  ED Room #:  ER11/11     Situation  Code Status: DNR   Allergies: Patient has no known allergies.  Weight: Patient Vitals for the past 96 hrs (Last 3 readings):   Weight   06/05/23 2229 67 kg (147 lb 11.3 oz)       Arrived from: nursing home [facility])    Chief Complaint:   Chief Complaint   Patient presents with    Vomiting       Hospital Problem/Diagnosis:  Principal Problem:    Nausea and vomiting  Resolved Problems:    * No resolved hospital problems. *      Mobility: limited bed mobility , new onset weakness , and a recent fall   ED Fall Risk: Presents to emergency department  because of falls (Syncope, seizure, or loss of consciousness): No, Age > 70: Yes, Altered Mental Status, Intoxication with alcohol or substance confusion (Disorientation, impaired judgment, poor safety awaremess, or inability to follow instructions): No, Impaired Mobility: Ambulates or transfers with assistive devices or assistance; Unable to ambulate or transer.: Yes, Nursing Judgement: Yes   Fell in ED or prior to admission: yes (1/19)  Restraints: no     Sitter: no   Family/Caregiver Present: no    Neet to know social/safety information: fall on January 19th, high fall risk, purewick, incontinence brief in use, A/Ox4    Background  History: No past medical history on file.    Assessment    Abnormal Assessment Findings: nausea/vomiting, high fall risk, limited bed mobility, fatigue  Imaging:   CT ABDOMEN PELVIS WO CONTRAST Additional Contrast? None   Final Result   No acute intra-abdominal pathology. Large amount of stool in the rectum with   moderate stool burden in the colon. Other incidental findings as above      Electronically signed by Lubertha [place]      XR CHEST PORTABLE   Final Result      No acute process on portable chest.         Electronically signed by Eual Fines      US ABDOMEN LIMITED Specify organ? GALLBLADDER,  LIVER    (Results Pending)     Abnormal labs:   Abnormal Labs Reviewed   COMPREHENSIVE METABOLIC PANEL - Abnormal; Notable for the following components:       Result Value    Sodium 128 (*)     Glucose 112 (*)     BUN 47 (*)     Creatinine 2.73 (*)     Est, Glom Filt Rate 16 (*)     Calcium 8.4 (*)     Total Bilirubin 2.1 (*)     ALT 909 (*)     AST 926 (*)     Alk Phosphatase 216 (*)     Albumin 2.6 (*)     Albumin/Globulin Ratio 0.7 (*)     All other components within normal limits   CBC WITH AUTO DIFFERENTIAL - Abnormal; Notable for the following components:    WBC 25.6 (*)     RBC 3.27 (*)     Hemoglobin 8.6 (*)     Hematocrit 27.2 (*)     RDW 21.9 (*)     Neutrophils % 87 (*)     Lymphocytes % 2 (*)     Neutrophils Absolute 22.27 (*)  Lymphocytes Absolute 0.51 (*)     Monocytes Absolute 2.82 (*)     All other components within normal limits   URINALYSIS WITH REFLEX TO CULTURE - Abnormal; Notable for the following components:    Appearance CLOUDY (*)     Protein, UA 100 (*)     Glucose, Ur 100 (*)     Blood, Urine SMALL (*)     Leukocyte Esterase, Urine MODERATE (*)     WBC, UA >100 (*)     BACTERIA, URINE 4+ (*)     Urine Culture if Indicated URINE CULTURE ORDERED (*)     All other components within normal limits   ACETAMINOPHEN LEVEL - Abnormal; Notable for the following components:    Acetaminophen Level 8 (*)     All other components within normal limits       Vitals/MEWS: MEWS Score: 2  Level of Consciousness: Alert (0)   Vitals:    06/05/23 2015 06/05/23 2215 06/05/23 2229 06/05/23 2345   BP: (!) 99/51 (!) 112/98  132/89   Pulse: 61 98  74   Resp: 18   24   Temp: 97.6 F (36.4 C) 98.4 F (36.9 C)  98.3 F (36.8 C)   TempSrc: Oral Oral  Oral   SpO2: 94% 94%  91%   Weight:   67 kg (147 lb 11.3 oz)    Height: 1.524 m (5')        DI:   Predictive Model Details          42 (Caution)  Factor Value    Calculated 06/06/2023 02:02 78% Age 45 years old    Deterioration Index Model 25% Respiratory rate 24     10%  Sodium abnormal (128 mmol/L)     9% WBC count abnormal (25.6 K/uL)     9% Potassium 4.8 mmol/L     6% Pulse oximetry 91 %     4% Hematocrit abnormal (27.2 %)     2% Systolic 132     2% BUN abnormal (47 MG/DL)     0% Pulse 74     0% Temperature 98.3 F (36.8 C)         FiO2 (%): room air  O2 Flow Rate: O2 Device: None (Room air)    Cardiac Rhythm:      Pain Scale: Pain Assessment  Pain Assessment: 0-10  Pain Level: 0  Patient's Stated Pain Goal: 0 - No pain  Last documented pain score: (0-10 scale) Pain Level: 0  Last documented pain medication administered: n/a     Mental Status: oriented and alert  Orientation Level:    NIH Score:    C-SSRS: Risk of Suicide: No Risk    PO Status: Water / Ice Chips  Bedside swallow:    Glasgow Coma Scale (GCS): Glasgow Coma Scale  Eye Opening: Spontaneous  Best Verbal Response: Oriented  Best Motor Response: Obeys commands  Glasgow Coma Scale Score: 15    Active LDA's:   Peripheral IV 06/05/23 Right Antecubital (Active)       Peripheral IV 06/05/23 Left Antecubital (Active)     Pertinent or High Risk Medications/Drips: no   If Yes, please provide details: n/a  Titratable drips: no   Pending Blood Product Administration: no     Sepsis: Sepsis Risk Score   Predictive Model Details          72 (High)  Factor Value    Calculated 06/06/2023 01:48 9% WBC Count 25.6 K/uL  [facility] EARLY DETECTION OF SEPSIS VERSION 2 Model 62% Age 39 years old     7% Albumin 2.6 g/dL     7% O2 Delivery Method ROOM AIR     6% Bilirubin 2.1 MG/DL     6% Creatinine 1.61 MG/DL     6% Respirations 24     3% Has Imaging Procedure present     3% Total Active Inpatient Meds 14     3% Absolute Lymphocytes 0.51 K/UL      Recent Labs     06/05/23  2121   WBC 25.6*     Blood Cultures Drawn: Yes  10:15 pm  Repeat LA: Time Due n/a  Antibiotic Given: Yes  Fluid Resuscitation: Total needed , Status completed, amount    VS x 2 post-fluid resuscitation: YES    Recommendation    Plan for next 24 hours:     Acute  Cystitis  -continue rocephin 1g daily  -follow urine cultures     Cholestasis / Hyperbilirubinemia / Transaminitis  -gallbladder sludge on ct abd et pelvis  -check ruq [place]  -hepatitis panel pending  -may need mrcp in am     Constipation  -miralax and colace bid  -mivf     Anemia  -normocytic with hgb 8.6  -check fe profile, ferritin, b12 and folate     HFpEF  -compensated. Gentle iv hydration  -gdmt as tolerated     AKI on CKD III  -cr 2.73 w/ baseline 2  -cont gentle ivfs'  -bmp daily     Afib / Hx of PE  -not anticoagulated     HTN  -low labile bps  -hold hom emeds     GERD / Dyslipidemia   -pta home med  Additional Comments: n/a     Pending orders AM labs  Consults ordered: IP CONSULT TO HOSPITALIST  IP CONSULT TO GI    Consulted provider:      If any further questions, please call Sending RN at [phone]  Electronically signed by: Electronically signed by Carlisle Cater, RN on 06/06/2023 at 2:02 AM

## 2023-06-06 NOTE — Care Coordination-Inpatient (Signed)
Care Management Initial Assessment       RUR:N/A  Readmission? No  1st IM letter given? Yes - 2/3  1st [employer] letter given: No   CM met with patient/spouse at bedside to introduce self and role. Patient lives at [facility].  Patient was admitted from [facility] SNF and plans to return there at discharge.  Family is paying to hold a room there.  CM sent referral to [facility] via Epic.      ADLs: Assistance with mobility  DME: Rollator  PCP follow up: Renato Battles, MD  Previous Home Health: No  Previous Skilled Nursing Facility: [facility]  Previous Inpatient Rehab: None  Insurance verified: Medicare A&B  Pharmacy: [name]  Emergency Contact: Kellie Simmering [phone]    CM will follow patient progress and assist as needed with [facility] plan.        06/06/23 1054   Service Assessment   Patient Orientation Alert and Oriented   Cognition Alert   History Provided By Child/Family   Primary Caregiver Other (Comment)  (Lives at [name])   Accompanied By/Relationship [name] Livick(daughter) [phone]   Support Systems Children   Patient's Healthcare Decision Maker is: Legal Next of Kin   PCP Verified by CM Yes   Last Visit to PCP Within last 3 months   Prior Functional Level Assistance with the following:;Mobility   Current Functional Level Assistance with the following:;Mobility   Can patient return to prior living arrangement Other (see comment)  (SNF at [facility])   Ability to make needs known: Fair   Family able to assist with home care needs: Yes   Would you like for me to discuss the discharge plan with any other family members/significant others, and if so, who? Yes  Kellie Simmering)   Financial Resources Medicare   Social/Functional History   Lives With Alone   Type of Home Assisted living   Home Layout One level   Home Access Ramped entrance;Level entry   Conservator, museum/gallery chair;Grab bars in [place]   Receives Help From Other (Comment)   Prior Level of Assist for ADLs Needs assistance   Toileting Independent   Homemaking Responsibilities No   Ambulation Assistance Needs assistance   Prior Level of Assist for Transfers Needs assistance   Active Driver No   Patient's Driver Info daughter   Mode of Engineer, water   Occupation Retired   Health visitor Other (Comment)  (ALF)   Potential Assistance Purchasing Medications No   Patient expects to be discharged to: Assisted living   Services At/After Discharge   Transition of Care Consult (CM Consult) SNF   Partner SNF No   Reason Why Partner SNF Not Chosen Friend/family recommendation   Services At/After Discharge Skilled Nursing Facility (SNF)   [employer] Information Provided? No   Mode of Transport at Discharge BLS   Condition of Participation: Discharge Planning   The Plan for Transition of Care is related to the following treatment goals: SNF   The Patient and/or Patient Representative was provided with a Choice of Provider? Patient Representative   Name of the Patient Representative who was provided with the Choice of Provider and agrees with the Discharge Plan?  Kellie Simmering   The Patient and/Or Patient Representative agree with the Discharge Plan? Yes   Freedom of Choice list was provided with basic dialogue that supports the  patient's individualized plan of care/goals, treatment preferences, and shares the quality data associated with the providers?  Yes     Cheree Ditto, RN/CRM  [phone]

## 2023-06-06 NOTE — Consults (Signed)
[facility]  [address]  [place], [place] [address]  [phone]                     GI CONSULTATION NOTE      NAME:  Avilene Marrin   DOB:   09-Feb-1928   MRN:   [id number]     Consult Date: 06/06/2023     Chief Complaint: cholestasis     History of Present Illness:  Patient is a 89 y.o. who is seen in consultation at the request of Dr. Darnelle Spangle for the above mentioned problem.    89 y/o female with HX of A-fib, HFpEF, pulmonary edema, HTN, HLD, presents to [facility] ER from [facility] rehab due to N/V, malaise, and fatigue. Recent fall with pelvic fracture, treated conservatively. Worsening constipation due to poor PO intake and limited mobility.     Lab on arrival: Alk Phos 216, ALT 909, AST 926, T.bili 2.1, WBC 25.6, NA+ 128, BUN 47, Creatinine 2.73, iron 13, iron sat 6%, ferritin 2983     Abd US   IMPRESSION:  1.  Hepatomegaly.  2.  Gallbladder polyps measuring up to 6 mm.  3.  Echogenic right kidney may be secondary medical renal disease.    CT AP   IMPRESSION:  No acute intra-abdominal pathology. Large amount of stool in the rectum with  moderate stool burden in the colon. Other incidental findings as above      PMH:  No past medical history on file.    PSH:  No past surgical history on file.    Allergies:  No Known Allergies    Home Medications:  Prior to Admission Medications   Prescriptions Last Dose Informant Patient Reported? Taking?   Multiple Vitamins-Minerals (PRESERVISION AREDS 2) CHEW  Transfer Papers/EMS, Other Yes No   Sig: Take 1 tablet by mouth daily   acetaminophen (TYLENOL) 500 MG tablet  Transfer Papers/EMS, Other Yes Yes   Sig: Take 1 tablet by mouth in the morning, at noon, and at bedtime   acetaminophen (TYLENOL) 500 MG tablet  Transfer Papers/EMS, Other Yes Yes   Sig: Take 1 tablet by mouth every 6 hours as needed for Pain   amiodarone (CORDARONE) 200 MG tablet   No No   Sig: Take 1 tablet by mouth 2 times daily for 3 days, THEN 1 tablet daily for 27 days. 1 tablet twice  a day (9 am and 9 pm) through 6/12, then 1 tablet daily (9 am) going forward.   apixaban (ELIQUIS) 2.5 MG TABS tablet  Transfer Papers/EMS, Other No No   Sig: Take 1 tablet by mouth 2 times daily   empagliflozin (JARDIANCE) 10 MG tablet  Transfer Papers/EMS, Other No No   Sig: Take 1 tablet by mouth daily   estradiol (ESTRACE) 0.1 MG/GM vaginal cream  Transfer Papers/EMS, Other Yes Yes   Sig: Place 0.5 g vaginally once a week On Mondays   ferrous sulfate (FE TABS 325) 325 (65 Fe) MG EC tablet  Transfer Papers/EMS, Other Yes Yes   Sig: Take 1 tablet by mouth every other day On even days   losartan (COZAAR) 25 MG tablet  Transfer Papers/EMS, Other Yes Yes   Sig: Take 1 tablet by mouth in the morning and at bedtime Hold for SBP<100   lovastatin (MEVACOR) 10 MG tablet  Transfer Papers/EMS, Other Yes Yes   Sig: Take 1 tablet by mouth nightly   metoprolol succinate (TOPROL XL) 50 MG  extended release tablet  Transfer Papers/EMS, Other Yes Yes   Sig: Take 1 tablet by mouth daily Hold for SBP<100   ondansetron (ZOFRAN) 4 MG tablet  Transfer Papers/EMS, Other Yes Yes   Sig: Take 1 tablet by mouth in the morning and at bedtime   ondansetron (ZOFRAN) 4 MG tablet  Transfer Papers/EMS, Other Yes Yes   Sig: Take 1 tablet by mouth every 8 hours as needed for Nausea or Vomiting   pantoprazole (PROTONIX) 40 MG tablet  Transfer Papers/EMS, Other Yes Yes   Sig: Take 1 tablet by mouth in the morning and at bedtime   polyethylene glycol (MIRALAX) 17 g PACK packet  Transfer Papers/EMS, Other Yes Yes   Sig: Take 17 g by mouth daily      Facility-Administered Medications: None       Hospital Medications:  Current Facility-Administered Medications   Medication Dose Route Frequency    cefTRIAXone (ROCEPHIN) 1,000 mg in sterile water 10 mL IV syringe  1,000 mg IntraVENous Q24H    polyethylene glycol (GLYCOLAX) packet 17 g  17 g Oral BID    sennosides-docusate sodium (SENOKOT-S) 8.6-50 MG tablet 2 tablet  2 tablet Oral BID    atorvastatin  (LIPITOR) tablet 10 mg  10 mg Oral Daily    pantoprazole (PROTONIX) tablet 40 mg  40 mg Oral QAM AC    sodium chloride flush 0.9 % injection 5-40 mL  5-40 mL IntraVENous 2 times per day    sodium chloride flush 0.9 % injection 5-40 mL  5-40 mL IntraVENous PRN    0.9 % sodium chloride infusion   IntraVENous PRN    ondansetron (ZOFRAN-ODT) disintegrating tablet 4 mg  4 mg Oral Q8H PRN    Or    ondansetron (ZOFRAN) injection 4 mg  4 mg IntraVENous Q6H PRN    polyethylene glycol (GLYCOLAX) packet 17 g  17 g Oral Daily PRN    acetaminophen (TYLENOL) tablet 650 mg  650 mg Oral Q6H PRN    Or    acetaminophen (TYLENOL) suppository 650 mg  650 mg Rectal Q6H PRN    0.9 % sodium chloride infusion   IntraVENous Continuous    gadoteridol (PROHANCE) injection 14 mL  14 mL IntraVENous ONCE PRN    sodium chloride 0.9 % bolus 100 mL  100 mL IntraVENous Once       Social History:  Social History     Tobacco Use    Smoking status: Former     Types: Cigarettes     Passive exposure: Past (stopped about 60 years ago)    Smokeless tobacco: Never   Substance Use Topics    Alcohol use: Yes     Alcohol/week: 2.0 standard drinks of alcohol     Types: 2 Glasses of wine per week       Family History:  No family history on file.    Review of Systems:    Constitutional:positive fatigue, malaise   Eyes:   negative visual changes  ENT:   negative sore throat, tongue or lip swelling  Respiratory:  negative cough, negative dyspnea  Cards:  negative for chest pain, palpitations, lower extremity edema  GI:   See HPI  GU:  negative for frequency, dysuria  Integument:  negative for rash and pruritus  Heme:  negative for easy bruising and gum/nose bleeding  Musculoskel: negative for myalgias,  back pain and muscle weakness  Neuro:  negative for headaches, dizziness, vertigo  Psych: negative for feelings of anxiety, depression  Objective:   Patient Vitals for the past 8 hrs:   BP Temp src Pulse SpO2   06/06/23 1156 -- -- 71 --   06/06/23 1000 -- -- 75  --   06/06/23 0827 (!) 113/44 Oral 83 95 %   06/06/23 0800 -- -- 85 --     No intake/output data recorded.  02/01 1901 - 02/03 0700  In: -   Out: 250 [Urine:250]      PHYSICAL EXAM:  General appearance: cooperative, no distress, appears stated age  Skin: Extremities and face reveal no rashes.  HEENT: Sclerae anicteric.   Cardiovascular: Regular rate and rhythm.  Respiratory: Comfortable breathing with no accessory muscle use.   GI: Abdomen nondistended, soft, and nontender. Normal active bowel sounds.   Musculoskeletal: No pitting edema of the lower legs. Weakness in BLE   Neurological:  Patient is alert and oriented.   Psychiatric: No anxiety or agitation.      Data Review     Recent Labs     06/05/23  2121 06/06/23  0553   WBC 25.6* 21.5*   HGB 8.6* 8.6*   HCT 27.2* 27.8*   PLT 341 342     Recent Labs     06/05/23  2121 06/06/23  0553   NA 128* 132*   K 4.8 4.5   CL 100 107   CO2 21 16*   BUN 47* 41*     Recent Labs     06/05/23  2121 06/06/23  0553   GLOB 4.0 3.8     No results for input(s): "INR", "APTT" in the last 72 hours.    Invalid input(s): "PTP"     Imaging studies reviewed    Assessment / Plan :     89 y/o female with HX of A-fib, HFpEF, pulmonary edema, HTN, HLD, presents to [facility] ER from [facility] rehab due to N/V, malaise, and fatigue. Recent fall with pelvic fracture, treated conservatively. Worsening constipation due to poor PO intake and limited mobility.     Flu/COVID (-)     Labs: ALT 919, AST 929, T. Bili 2.0, WBC 21.5  BC (+), + UTI    Hepatitis panel (-)     Abd US showed hepatomegaly  CT with gallbladder sludge, and large fecal burden     - MRI pending   - CLD   - bowel regimen- MiraLAX BID, Senna-kot   - monitor LFTs  - ABX per ID   - supportive care per primary team        Patient Active Hospital Problem List:   Principal Problem:    Nausea and vomiting  Active Problems:    Bacteremia  Resolved Problems:    * No resolved hospital problems. Desma Mcgregor, APRN - NP

## 2023-06-07 LAB — CBC WITH AUTO DIFFERENTIAL
Basophils %: 0 % (ref 0–1)
Basophils Absolute: 0 10*3/uL (ref 0.0–0.1)
Eosinophils %: 0 % (ref 0–7)
Eosinophils Absolute: 0 10*3/uL (ref 0.0–0.4)
Hematocrit: 24.2 % — ABNORMAL LOW (ref 35.0–47.0)
Hemoglobin: 7.7 g/dL — ABNORMAL LOW (ref 11.5–16.0)
Immature Granulocytes %: 0 %
Immature Granulocytes Absolute: 0 10*3/uL
Lymphocytes %: 6 % — ABNORMAL LOW (ref 12–49)
Lymphocytes Absolute: 0.76 10*3/uL — ABNORMAL LOW (ref 0.8–3.5)
MCH: 26.8 pg (ref 26.0–34.0)
MCHC: 31.8 g/dL (ref 30.0–36.5)
MCV: 84.3 fL (ref 80.0–99.0)
MPV: 10.8 fL (ref 8.9–12.9)
Monocytes %: 8 % (ref 5–13)
Monocytes Absolute: 1.02 10*3/uL — ABNORMAL HIGH (ref 0.0–1.0)
Neutrophils %: 86 % — ABNORMAL HIGH (ref 32–75)
Neutrophils Absolute: 10.92 10*3/uL — ABNORMAL HIGH (ref 1.8–8.0)
Nucleated RBCs: 0 /100{WBCs}
Platelets: 372 10*3/uL (ref 150–400)
RBC: 2.87 M/uL — ABNORMAL LOW (ref 3.80–5.20)
RDW: 22 % — ABNORMAL HIGH (ref 11.5–14.5)
WBC: 12.7 10*3/uL — ABNORMAL HIGH (ref 3.6–11.0)
nRBC: 0 10*3/uL (ref 0.00–0.01)

## 2023-06-07 LAB — COMPREHENSIVE METABOLIC PANEL
ALT: 672 U/L — ABNORMAL HIGH (ref 12–78)
AST: 494 U/L — ABNORMAL HIGH (ref 15–37)
Albumin/Globulin Ratio: 0.6 — ABNORMAL LOW (ref 1.1–2.2)
Albumin: 2.2 g/dL — ABNORMAL LOW (ref 3.5–5.0)
Alk Phosphatase: 187 U/L — ABNORMAL HIGH (ref 45–117)
Anion Gap: 10 mmol/L (ref 2–12)
BUN/Creatinine Ratio: 25 — ABNORMAL HIGH (ref 12–20)
BUN: 39 mg/dL — ABNORMAL HIGH (ref 6–20)
CO2: 15 mmol/L — CL (ref 21–32)
Calcium: 8 mg/dL — ABNORMAL LOW (ref 8.5–10.1)
Chloride: 111 mmol/L — ABNORMAL HIGH (ref 97–108)
Creatinine: 1.58 mg/dL — ABNORMAL HIGH (ref 0.55–1.02)
Est, Glom Filt Rate: 30 mL/min/{1.73_m2} — ABNORMAL LOW (ref >60)
Globulin: 3.5 g/dL (ref 2.0–4.0)
Glucose: 86 mg/dL (ref 65–100)
Potassium: 3.9 mmol/L (ref 3.5–5.1)
Sodium: 136 mmol/L (ref 136–145)
Total Bilirubin: 0.9 mg/dL (ref 0.2–1.0)
Total Protein: 5.7 g/dL — ABNORMAL LOW (ref 6.4–8.2)

## 2023-06-07 LAB — POCT GLUCOSE: POC Glucose: 179 mg/dL — ABNORMAL HIGH (ref 65–117)

## 2023-06-07 MED ORDER — IRON SUCROSE 20 MG/ML IV SOLN
20 | INTRAVENOUS | Status: DC
Start: 2023-06-07 — End: 2023-06-07

## 2023-06-07 MED ORDER — IRON SUCROSE 20 MG/ML IV SOLN
20 MG/ML | INTRAVENOUS | Status: AC
Start: 2023-06-07 — End: 2023-06-09
  Administered 2023-06-07 – 2023-06-09 (×3): 200 mg via INTRAVENOUS

## 2023-06-07 MED FILL — IRON SUCROSE 20 MG/ML IV SOLN: 20 MG/ML | INTRAVENOUS | Qty: 10

## 2023-06-07 MED FILL — ATORVASTATIN CALCIUM 10 MG PO TABS: 10 MG | ORAL | Qty: 1

## 2023-06-07 MED FILL — SENNA PLUS 8.6-50 MG PO TABS: 8.6-50 MG | ORAL | Qty: 2

## 2023-06-07 MED FILL — CEFTRIAXONE SODIUM 2 G IJ SOLR: 2 g | INTRAMUSCULAR | Qty: 2000

## 2023-06-07 MED FILL — MIRALAX MIX-IN PAX 17 G PO PACK: 17 g | ORAL | Qty: 1

## 2023-06-07 MED FILL — SENNA PLUS 8.6-50 MG PO TABS: 8.6-50 MG | ORAL | Qty: 1

## 2023-06-07 MED FILL — PANTOPRAZOLE SODIUM 40 MG PO TBEC: 40 MG | ORAL | Qty: 1

## 2023-06-07 NOTE — Plan of Care (Signed)
Problem: Safety - Adult  Goal: Free from fall injury  06/07/2023 0835 by Joanna Hews, RN  Outcome: Progressing  06/06/2023 1852 by Val Riles, RN  Outcome: Progressing     Problem: Discharge Planning  Goal: Discharge to home or other facility with appropriate resources  06/07/2023 0835 by Joanna Hews, RN  Outcome: Progressing  06/06/2023 1852 by Val Riles, RN  Outcome: Progressing     Problem: Pain  Goal: Verbalizes/displays adequate comfort level or baseline comfort level  06/07/2023 0835 by Joanna Hews, RN  Outcome: Progressing  06/06/2023 1852 by Val Riles, RN  Outcome: Progressing     Problem: Skin/Tissue Integrity  Goal: Skin integrity remains intact  Description: 1.  Monitor for areas of redness and/or skin breakdown  2.  Assess vascular access sites hourly  3.  Every 4-6 hours minimum:  Change oxygen saturation probe site  4.  Every 4-6 hours:  If on nasal continuous positive airway pressure, respiratory therapy assess nares and determine need for appliance change or resting period  Outcome: Progressing

## 2023-06-07 NOTE — Plan of Care (Signed)
Problem: Occupational Therapy - Adult  Goal: By Discharge: Performs self-care activities at highest level of function for planned discharge setting.  See evaluation for individualized goals.  Description: FUNCTIONAL STATUS PRIOR TO ADMISSION:  Patient was ambulatory using Rollator.  Recently at [facility] for rehab secondary to pelvic fracture 05/22/2023  Receives Help From: Other (Comment), Prior Level of Assist for ADLs: Needs assistance,  ,  ,  ,  , Toileting: Independent,  , Ambulation Assistance: Needs assistance, Prior Level of Assist for Transfers: Needs assistance, Active Driver: No     HOME SUPPORT: Patient admitted from [facility] rehab, with hopes to return.  Lives at the [facility]F at baseline.    Occupational Therapy Goals:  Initiated 06/06/2023  1.  Patient will perform RW grooming with Contact Guard Assist within 7 day(s).  2.  Patient will perform bathing with Minimal Assist within 7 day(s).  3.  Patient will perform RW lower body dressing with Minimal Assist within 7 day(s).  4.  Patient will perform RW toilet transfers with Contact Guard Assist  within 7 day(s).  5.  Patient will perform all aspects of RW toileting with Contact Guard Assist within 7 day(s).        Outcome: Not Progressing     OCCUPATIONAL THERAPY TREATMENT    Patient: Mary Woodward (89 y.o. female)  Date: 06/07/2023  Primary Diagnosis: Nausea and vomiting [R11.2]  Elevated LFTs [R79.89]  AKI (acute kidney injury) (HCC) [N17.9]  Acute cystitis with hematuria [N30.01]  Sepsis without acute organ dysfunction, due to unspecified organism [facility]) [A41.9]  Bacteremia [R78.81]       Precautions: Fall Risk                Chart, occupational therapy assessment, plan of care, and goals were reviewed.    ASSESSMENT  Patient continues to benefit from skilled OT services and is not progressing towards goals. Pt's performance of ADL/IADL tasks continues to be limited by impaired balance, activity tolerance, generalized weakness, orthostatic  hypotension, and cognition (insight, safety awareness). Pt received semi-supine and minimally agreeable to participation in therapy session. She reported significant discomfort at bed level, stating "I just feel horrible". Vitals obtained and BP soft though pt denied lightheadedness/dizziness and all other VSS on RA. Pt expressed need to void on BSC and therefore attempted transfer to EOB, however pt required extended time and max A to complete. Once seated, pt's WOB significantly increased and SpO2 noted to drop to 85%. BP also dropped to 63/48. Despite seated activity, BP remained low and therefore pt placed back in supine. D/t persistent hypotension, pt placed in trendelenburg position. RN notified and present as BP slowly improving and pt status escalated to primary team.          PLAN :  Patient continues to benefit from skilled intervention to address the above impairments.  Continue treatment per established plan of care to address goals.    Recommendation for discharge: (in order for the patient to meet his/her long term goals):   Moderate intensity short-term skilled occupational therapy up to 5x/week    Other factors to consider for discharge: poor safety awareness, impaired cognition, high risk for falls, and concern for safely navigating or managing the home environment    IF patient discharges home will need the following DME: continuing to assess with progress       SUBJECTIVE:   Patient stated "I feel terrible ."    OBJECTIVE DATA SUMMARY:   Cognitive/Behavioral Status:  Orientation  Overall Orientation Status: Within Normal Limits  Orientation Level: Oriented X4  Cognition  Overall Cognitive Status: WFL    Functional Mobility and Transfers for ADLs:  Bed Mobility:  Bed Mobility Training  Bed Mobility Training: Yes  Rolling: Maximum assistance;Additional time  Supine to Sit: Maximum assistance;Additional time;Assist X1  Sit to Supine: Maximum assistance;Additional time;Assist X1  Scooting:  Contact-guard assistance         Balance:     Balance  Sitting: Impaired  Sitting - Static: Fair (occasional);Poor (constant support)  Standing - Static: Not tested  Standing - Dynamic: Not tested      ADL Intervention:     Pt unable to complete ADLs d/t significant and persistent hypotension during session.     Pain Rating:  0/10       Activity Tolerance:   Poor and signs and symptoms of orthostatic hypotension    06/07/23 1054 06/07/23 1058 06/07/23 1104    Vitals   Pulse (!) 110 (!) 104 (!) 106   Heart Rate Source  --  Monitor  --    BP (!) 86/56 (!) 101/51 (!) 63/48   MAP (Calculated) 66 68 53   BP Location Left upper arm Right upper arm Right upper arm   BP Upper/Lower Upper Upper Upper   BP Method Automatic Automatic Automatic   Patient Position Semi fowlers Semi fowlers Sitting  (EOB)   SpO2 96 %  --  (!) 86 %   O2 Device None (Room air)  --  None (Room air)        06/07/23 1107 06/07/23 1111 06/07/23 1113   Vitals   Pulse (!) 105 99 89   Heart Rate Source  --   --   --    BP (!) 89/54 (!) 101/47 (!) 94/47   MAP (Calculated) 66 65 63   BP Location Right upper arm Right upper arm Right upper arm   BP Upper/Lower Upper Upper Upper   BP Method Automatic Automatic Automatic   Patient Position Supine Supine;Trendelenburg Supine;Trendelenburg   SpO2 97 %  --   --    O2 Device None (Room air)  --   --        After treatment:   Patient left in no apparent distress in bed, Call bell within reach, Bed/ chair alarm activated, Side rails x3, and Heels elevated for pressure relief - in trendelenburg position with RN present to assess pt status and ongoing hypotension     COMMUNICATION/EDUCATION:   The patient's plan of care was discussed with: physical therapist and registered nurse    Patient Education  Education Given To: Patient  Education Provided: Role of Therapy;Plan of Care;ADL Adaptive Strategies;Precautions;Energy Arts administrator  Education Method: Demonstration;Verbal  Barriers to Learning:  None  Education Outcome: Verbalized understanding;Demonstrated understanding    Thank you for this referral.  Gwendolyn Lima, OTD, OTR/L  Minutes: 30

## 2023-06-07 NOTE — Progress Notes (Incomplete)
06/07/23 1054 06/07/23 1058 06/07/23 1104   Vitals   Pulse (!) 110 (!) 104 (!) 106   Heart Rate Source  --  Monitor  --    BP (!) 86/56 (!) 101/51 (!) 63/48   MAP (Calculated) 66 68 53   BP Location Left upper arm Right upper arm Right upper arm   BP Upper/Lower Upper Upper Upper   BP Method Automatic Automatic Automatic   Patient Position Semi fowlers Semi fowlers Sitting  (EOB)   SpO2 96 %  --  (!) 86 %   O2 Device None (Room air)  --  None (Room air)      06/07/23 1107 06/07/23 1111 06/07/23 1113   Vitals   Pulse (!) 105 99 89   Heart Rate Source  --   --   --    BP (!) 89/54 (!) 101/47 (!) 94/47   MAP (Calculated) 66 65 63   BP Location Right upper arm Right upper arm Right upper arm   BP Upper/Lower Upper Upper Upper   BP Method Automatic Automatic Automatic   Patient Position Supine Supine;Trendelenburg Supine;Trendelenburg   SpO2 97 %  --   --    O2 Device None (Room air)  --   --

## 2023-06-07 NOTE — Progress Notes (Signed)
[place] [facility]  [address]  [place], [place] [address]  [phone]                     GI PROGRESS NOTE    Patient Name: Mary Woodward      DOB: July 29, 1927      MRN: [id number]  Admit Date: 06/05/2023  Today's Date: 06/07/2023  CC: cholestasis     Subjective:     Patient appears uncomfortable on arrival, she is abdominal breathing and tahcypnic, she denies SOB.   She states she "feels horrible, just over all yucky".     OT also to see patient - OT obtained vitals, patient HR is elevated and BP is low- primary team notified.     Objective:     Blood pressure (!) 107/52, pulse 96, temperature 97.9 F (36.6 C), resp. rate 14, height 1.524 m (5'), weight 67 kg (147 lb 11.3 oz), SpO2 97%.    Physical Exam:  General appearance: appears uncomfortable.   Skin: Extremities and face reveal no rashes.   HEENT: Sclerae anicteric.   Cardiovascular: irregular, tachycardia   Respiratory: Comfortable breathing, tachypnea  GI: Abdomen nondistended, soft, and nontender. Normal active bowel sounds  Rectal: Deferred   Musculoskeletal: No pitting edema of the lower legs.    Neurological: Gross memory appears intact. Patient is alert and oriented.   Psychiatric:  No anxiety or agitation.      Data Review:    Recent Results (from the past 24 hour(s))   Comprehensive Metabolic Panel    Collection Time: 06/07/23  5:57 AM   Result Value Ref Range    Sodium 136 136 - 145 mmol/L    Potassium 3.9 3.5 - 5.1 mmol/L    Chloride 111 (H) 97 - 108 mmol/L    CO2 15 (LL) 21 - 32 mmol/L    Anion Gap 10 2 - 12 mmol/L    Glucose 86 65 - 100 mg/dL    BUN 39 (H) 6 - 20 MG/DL    Creatinine 2.13 (H) 0.55 - 1.02 MG/DL    BUN/Creatinine Ratio 25 (H) 12 - 20      Est, Glom Filt Rate 30 (L) >60 ml/min/1.63m2    Calcium 8.0 (L) 8.5 - 10.1 MG/DL    Total Bilirubin 0.9 0.2 - 1.0 MG/DL    ALT 086 (H) 12 - 78 U/L    AST 494 (H) 15 - 37 U/L    Alk Phosphatase 187 (H) 45 - 117 U/L    Total Protein 5.7 (L) 6.4 - 8.2 g/dL    Albumin 2.2 (L) 3.5 - 5.0  g/dL    Globulin 3.5 2.0 - 4.0 g/dL    Albumin/Globulin Ratio 0.6 (L) 1.1 - 2.2     CBC with Auto Differential    Collection Time: 06/07/23  5:57 AM   Result Value Ref Range    WBC 12.7 (H) 3.6 - 11.0 K/uL    RBC 2.87 (L) 3.80 - 5.20 M/uL    Hemoglobin 7.7 (L) 11.5 - 16.0 g/dL    Hematocrit 57.8 (L) 35.0 - 47.0 %    MCV 84.3 80.0 - 99.0 FL    MCH 26.8 26.0 - 34.0 PG    MCHC 31.8 30.0 - 36.5 g/dL    RDW 46.9 (H) 62.9 - 14.5 %    Platelets 372 150 - 400 K/uL    MPV 10.8 8.9 - 12.9 FL    Nucleated RBCs 0.0  0 PER 100 WBC    nRBC 0.00 0.00 - 0.01 K/uL    Neutrophils % 86 (H) 32 - 75 %    Lymphocytes % 6 (L) 12 - 49 %    Monocytes % 8 5 - 13 %    Eosinophils % 0 0 - 7 %    Basophils % 0 0 - 1 %    Immature Granulocytes % 0 %    Neutrophils Absolute 10.92 (H) 1.8 - 8.0 K/UL    Lymphocytes Absolute 0.76 (L) 0.8 - 3.5 K/UL    Monocytes Absolute 1.02 (H) 0.0 - 1.0 K/UL    Eosinophils Absolute 0.00 0.0 - 0.4 K/UL    Basophils Absolute 0.00 0.0 - 0.1 K/UL    Immature Granulocytes Absolute 0.00 K/UL    Differential Type MANUAL      RBC Comment ANISOCYTOSIS  2+        RBC Comment BURR CELLS  PRESENT       Culture, Blood 1    Collection Time: 06/07/23  9:20 AM    Specimen: Blood   Result Value Ref Range    Special Requests LEFT  HAND        Culture NO GROWTH <24 HRS     Culture, Blood 2    Collection Time: 06/07/23  9:20 AM    Specimen: Blood   Result Value Ref Range    Special Requests RIGHT  HAND        Culture NO GROWTH <24 HRS           Assessment / Plan :     89 y/o female with HX of A-fib, HFpEF, pulmonary edema, HTN, HLD, presents to [facility] ER from [facility] due to N/V, malaise, and fatigue. Recent fall with pelvic fracture, treated conservatively. Worsening constipation due to poor PO intake and limited mobility.      Flu/COVID (-)      Labs improved: Alk Phos 187( 206), ALT 672 (919), AST 494 (929), T. Bili 0.9 (2.0), WBC 12.7 ( 21.5)      Abd US showed hepatomegaly  CT with gallbladder sludge, and large fecal burden      MRI/MRCP showed no obvious large stones in the gallbladder. No biliary or pancreatic ductal  dilatation.     - Nephrology consulted   - CLD AAT   - monitor LFTs  - ABX per ID   - supportive care per primary team     NO evidence of biliary obstruction, suspect elevated LFTs r/t inflammatory state in setting of sepsis as evidence by bacteremia, leukocytosis, and pyelonephritis. No need for endoscopic intervention. GI will sign off at this time.        Patient Active Hospital Problem List:   Principal Problem:    Nausea and vomiting  Active Problems:    Bacteremia  Resolved Problems:    * No resolved hospital problems. *      Time Spent with Patient: 33    Desma Mcgregor, APRN - NP

## 2023-06-07 NOTE — Progress Notes (Addendum)
Infectious Diseases Follow Up    06/07/2023      Assessment & Plan:     89 y/o female with sepsis with E coli bacteremia and transaminitis.    E.coli bacteremia  Transaminitis  Leukocytosis  Urine cx GNR  Suspected urinary source and pyelonephritis.     Continue ceftriaxone     Follow urine culture. Repeat blood cx today     Trend liver enzymes and bilirubin - improving     If able to check PVR, can perform, though utility limited due to urinary incontinence.    D/w Dr. Dagoberto Ligas, pt        Anti-infectives:   ceftriaxone    Subjective:   Denies nausea, no CVA tenderness    Objective:     Vitals: BP 120/72   Pulse 52   Temp 97.9 F (36.6 C)   Resp 14   Ht 1.524 m (5')   Wt 67 kg (147 lb 11.3 oz)   SpO2 92%   BMI 28.85 kg/m      Tmax:  Temp (24hrs), Avg:98.2 F (36.8 C), Min:97.9 F (36.6 C), Max:98.4 F (36.9 C)      Exam:   Patient is intubated:  no    Exam:        GEN: NAD  HEENT: Normocephalic, atraumatic  CV: normal rate  Lungs: Nl effort  Abdomen: soft, non distended, non tender no b/l CVA nor suprapubic TTP  Genitourinary: no foley  Extremities: no edema  Neuro: Alert, oriented to time, place and situation, moves all extremities to commands, verbal   Skin: no rash  Psych: good affect, pleasant       Labs:        Invalid input(s): "ITNL"   No results for input(s): "CPK", "CKMB" in the last 72 hours.    Invalid input(s): "TROIQ"  Recent Labs     06/05/23  2121 06/06/23  0553 06/07/23  0557   NA 128* 132*  --    K 4.8 4.5  --    CL 100 107  --    CO2 21 16*  --    BUN 47* 41*  --    WBC 25.6* 21.5* 12.7*   HGB 8.6* 8.6* 7.7*   HCT 27.2* 27.8* 24.2*   PLT 341 342 372     No results for input(s): "INR", "APTT" in the last 72 hours.    Invalid input(s): "PTP"          Cultures:   @MICRORESULTS @      Radiology:     Medications:          Current Facility-Administered Medications:     iron sucrose (VENOFER) 200 mg in sodium chloride 0.9 % 100 mL IVPB, 200 mg, IntraVENous, Q24H, Hill, Sharion Dove, APRN - NP     polyethylene glycol (GLYCOLAX) packet 17 g, 17 g, Oral, BID, Ngwafang, Bleck B, MD, 17 g at 06/07/23 0816    sennosides-docusate sodium (SENOKOT-S) 8.6-50 MG tablet 2 tablet, 2 tablet, Oral, BID, Ngwafang, Bleck B, MD, 2 tablet at 06/07/23 0816    atorvastatin (LIPITOR) tablet 10 mg, 10 mg, Oral, Daily, Ngwafang, Bleck B, MD, 10 mg at 06/07/23 0816    pantoprazole (PROTONIX) tablet 40 mg, 40 mg, Oral, QAM AC, Ngwafang, Bleck B, MD, 40 mg at 06/07/23 0608    sodium chloride flush 0.9 % injection 5-40 mL, 5-40 mL, IntraVENous, 2 times per day, Ngwafang, Bleck B, MD, 10 mL at 06/07/23 [phone]  sodium chloride flush 0.9 % injection 5-40 mL, 5-40 mL, IntraVENous, PRN, Ngwafang, Bleck B, MD    0.9 % sodium chloride infusion, , IntraVENous, PRN, Ngwafang, Bleck B, MD    ondansetron (ZOFRAN-ODT) disintegrating tablet 4 mg, 4 mg, Oral, Q8H PRN **OR** ondansetron (ZOFRAN) injection 4 mg, 4 mg, IntraVENous, Q6H PRN, Ngwafang, Bleck B, MD, 4 mg at 06/06/23 0559    polyethylene glycol (GLYCOLAX) packet 17 g, 17 g, Oral, Daily PRN, Ngwafang, Bleck B, MD    acetaminophen (TYLENOL) tablet 650 mg, 650 mg, Oral, Q6H PRN **OR** acetaminophen (TYLENOL) suppository 650 mg, 650 mg, Rectal, Q6H PRN, Ngwafang, Bleck B, MD    sodium chloride 0.9 % bolus 100 mL, 100 mL, IntraVENous, Once, Ngwafang, Bleck B, MD    cefTRIAXone (ROCEPHIN) 2,000 mg in sterile water 20 mL IV syringe, 2,000 mg, IntraVENous, Q24H, Quincy Simmonds, MD, 2,000 mg at 06/07/23 9562    lactated ringers infusion, , IntraVENous, Continuous, Richelle Ito, PA-C, Last Rate: 75 mL/hr at 06/06/23 1718, New Bag at 06/06/23 1718    Thank you for the opportunity to participate in the care of this patient.          A total time of 35 minutes was spent on today's encounter.  Greater than 50% of the time was spent on the following:  Preparing for visit and chart review.  Obtaining and/or reviewing separately obtained history  Performing a medically appropriate exam and/or  evaluation  Counseling and educating a patient/family/caregiver as noted above  Placing relevant orders  Referring and communicating with other professionals (not separately reported)  Independently interpreting results (not separately reported) and communicating results to the patient/family/caregiver  Care coordination (not separately reported) as noted above  Documenting clinical information in the electronic health records (e.g. problem list, visit note) on the day of the encounter     Lance Muss, APRN - NP

## 2023-06-07 NOTE — Progress Notes (Cosign Needed)
Hospitalist Progress Note  Delight Stare, APRN - NP  Answering service: [phone] OR [phone] from in house phone        Date of Service:  06/07/2023  NAME:  Mary Woodward  DOB:  Jul 14, 1927  MRN:  [id number]      Admission Summary:   Per H&P 2/2   Mary Woodward is a 89 y.o. female with with pmh of paroxysmal atrial fibrillation, HFpEF, pulmonary edema, hypertension, hyperlipidemia, PE, osteoarthritis, osteoporosis who presented to ed with complaints of abdominal pain, nausea and vomiting. Some cramping abdominal pain and constipation over the last week with nausea and non-bloody, non bilious emesis over the last 2 days.  Also with poor po intake, malaise and fatigue. Suffered a recent fall with a pelvic fracture; treated conservatively. Has had difficulty ambulating as a result.     The patient denies any fever, chills, chest pain, cough, congestion, recent illness, palpitations, or dysuria.  Remarkable vitals on ER Presentation: vss  Labs Remarkable for: wbc 25.6, hgb 8.6, na 128, cr 2.73, t-bili 2.1, alt 909, ast 926, alk phos 216, alb 2.6, UA: grossly infected.  ER Images: ct abd et pelvis: no acute process. Sludge in gallbladder  ER Rx: vanc, cefepime, 2.3l ns bolus, zofran    Interval history / Subjective:   I saw the patient this morning on rounds.  Patient states not feeling good     Assessment & Plan:         Acute Cystitis/pyelonephritis  Pyelonephritis as seen on imaging, MRI  Urine culture with GNR so far  Continuing ceftriaxone         Cholestasis / Hyperbilirubinemia / Transaminitis  Gallbladder sludge on CT  MRCP has been done  GI following, appreciate recommendations  Labs today with improvement in transaminases, T. bili is normal         Bacteremia   Possible E. coli  Continuing ceftriaxone  Possible urinary source  Infectious disease following, appreciate recommendations         Constipation  -miralax and  senna-kot  -mivf     NAGMA  Bicarbonate was 16 yesterday  Labs today with bicarbonate of 15, creatinine is 1.58  Continue LR  I have consulted with nephrology     Anemia  Iron significantly low  Will give iron sucrose     HFpEF  Does not appear to be in exacerbation  Continue to monitor     AKI on CKD III  Not far from baseline     Hyponatremia   This is chronic issue  Improved, sodium 132 today     Afib / Hx of PE  Heart rate currently controlled without medications  Does not appear to be on anticoagulation at home, was previously on rivaroxaban however not felt from her pharmacy for over 1 year     HTN  Blood pressure currently controlled without medications     GERD / Dyslipidemia   Pantoprazole and atorvastatin       Code status: DNR  Prophylaxis: SCD  Care Plan discussed with: Patient, nurse, case manager, ID  Anticipated Disposition: Pending progression     Principal Problem:    Nausea and vomiting  Active Problems:    Bacteremia  Resolved Problems:    * No resolved hospital problems. *            Review of Systems:   Pertinent items are noted in HPI.         Vital Signs:    Last  24hrs VS reviewed since prior progress note. Most recent are:  BP 120/72   Pulse 52   Temp 97.9 F (36.6 C)   Resp 14   Ht 1.524 m (5')   Wt 67 kg (147 lb 11.3 oz)   SpO2 92%   BMI 28.85 kg/m        Intake/Output Summary (Last 24 hours) at 06/07/2023 0820  Last data filed at 06/07/2023 1610  Gross per 24 hour   Intake --   Output 400 ml   Net -400 ml        Physical Examination:     I had a face to face encounter with this patient and independently examined them on 06/07/2023 as outlined below:          General :  no acute distress,   HEENT: NCAT moist mucus membrane  Neck: supple, no JVD,   Chest: CTA   CVS: RRR S1 S2   Abd: soft NT ND BS+   Ext:  no edema  Neuro/Psych: pleasant mood and affect, PERLA EOMI MAE  Skin: warm              Data Review:    Review and/or order of clinical lab test  Review and/or order of tests in the  radiology section of CPT  I personally reviewed  Image    I have independently reviewed and interpreted patient's lab and all other diagnostic data    Notes reviewed from all clinical/nonclinical/nursing services involved in patient's clinical care. Care coordination discussions were held with appropriate clinical/nonclinical/ nursing providers based on care coordination needs.     Labs:     Recent Labs     06/06/23  0553 06/07/23  0557   WBC 21.5* 12.7*   HGB 8.6* 7.7*   HCT 27.8* 24.2*   PLT 342 372     Recent Labs     06/05/23  2121 06/06/23  0553   NA 128* 132*   K 4.8 4.5   CL 100 107   CO2 21 16*   BUN 47* 41*     Recent Labs     06/05/23  2121 06/06/23  0553   ALT 909* 919*   GLOB 4.0 3.8     No results for input(s): "INR", "APTT" in the last 72 hours.    Invalid input(s): "PTP"   Recent Labs     06/06/23  0209   TIBC 236*      No results found for: "RBCF"   No results for input(s): "PH", "PCO2", "PO2" in the last 72 hours.  No results for input(s): "CPK" in the last 72 hours.    Invalid input(s): "CPKMB", "CKNDX", "TROIQ"  No results found for: "CHOL", "CHLST", "CHOLV", "HDL", "HDLC", "LDL", "LDLC"  No results found for: "GLUCPOC"        Medications Reviewed:     Current Facility-Administered Medications   Medication Dose Route Frequency    polyethylene glycol (GLYCOLAX) packet 17 g  17 g Oral BID    sennosides-docusate sodium (SENOKOT-S) 8.6-50 MG tablet 2 tablet  2 tablet Oral BID    atorvastatin (LIPITOR) tablet 10 mg  10 mg Oral Daily    pantoprazole (PROTONIX) tablet 40 mg  40 mg Oral QAM AC    sodium chloride flush 0.9 % injection 5-40 mL  5-40 mL IntraVENous 2 times per day    sodium chloride flush 0.9 % injection 5-40 mL  5-40 mL IntraVENous PRN    0.9 %  sodium chloride infusion   IntraVENous PRN    ondansetron (ZOFRAN-ODT) disintegrating tablet 4 mg  4 mg Oral Q8H PRN    Or    ondansetron (ZOFRAN) injection 4 mg  4 mg IntraVENous Q6H PRN    polyethylene glycol (GLYCOLAX) packet 17 g  17 g Oral Daily  PRN    acetaminophen (TYLENOL) tablet 650 mg  650 mg Oral Q6H PRN    Or    acetaminophen (TYLENOL) suppository 650 mg  650 mg Rectal Q6H PRN    sodium chloride 0.9 % bolus 100 mL  100 mL IntraVENous Once    cefTRIAXone (ROCEPHIN) 2,000 mg in sterile water 20 mL IV syringe  2,000 mg IntraVENous Q24H    lactated ringers infusion   IntraVENous Continuous     ______________________________________________________________________  EXPECTED LENGTH OF STAY: Unable to retrieve estimated LOS  ACTUAL LENGTH OF STAY:          1                 Delight Stare, APRN - NP

## 2023-06-07 NOTE — Progress Notes (Signed)
Physical Therapy  Attempted PT session.  Patient with OT at bedside.  Had sat EOB with drastic drop in BP 60s/40s and was not recovering.  Session aborted and RN notified.  Leverne Humbles, PT

## 2023-06-07 NOTE — Progress Notes (Signed)
A Spiritual Care Partner Volunteer visited Mary Woodward at [facility] in [facility] on 06/07/2023     Documented by: Berna Bue

## 2023-06-08 ENCOUNTER — Inpatient Hospital Stay: Admit: 2023-06-08 | Payer: MEDICARE | Primary: Internal Medicine

## 2023-06-08 LAB — CBC WITH AUTO DIFFERENTIAL
Basophils %: 0 % (ref 0–1)
Basophils Absolute: 0 10*3/uL (ref 0.0–0.1)
Eosinophils %: 0 % (ref 0–7)
Eosinophils Absolute: 0 10*3/uL (ref 0.0–0.4)
Hematocrit: 23.1 % — ABNORMAL LOW (ref 35.0–47.0)
Hemoglobin: 7.3 g/dL — ABNORMAL LOW (ref 11.5–16.0)
Immature Granulocytes %: 0 %
Immature Granulocytes Absolute: 0 10*3/uL
Lymphocytes %: 10 % — ABNORMAL LOW (ref 12–49)
Lymphocytes Absolute: 0.97 10*3/uL (ref 0.8–3.5)
MCH: 26 pg (ref 26.0–34.0)
MCHC: 31.6 g/dL (ref 30.0–36.5)
MCV: 82.2 fL (ref 80.0–99.0)
MPV: 10.5 fL (ref 8.9–12.9)
Monocytes %: 8 % (ref 5–13)
Monocytes Absolute: 0.78 10*3/uL (ref 0.0–1.0)
Neutrophils %: 82 % — ABNORMAL HIGH (ref 32–75)
Neutrophils Absolute: 7.95 10*3/uL (ref 1.8–8.0)
Nucleated RBCs: 0.3 /100{WBCs} — ABNORMAL HIGH
Platelets: 379 10*3/uL (ref 150–400)
RBC: 2.81 M/uL — ABNORMAL LOW (ref 3.80–5.20)
RDW: 21.9 % — ABNORMAL HIGH (ref 11.5–14.5)
WBC: 9.7 10*3/uL (ref 3.6–11.0)
nRBC: 0.03 10*3/uL — ABNORMAL HIGH (ref 0.00–0.01)

## 2023-06-08 LAB — CULTURE, BLOOD 2

## 2023-06-08 LAB — POCT GLUCOSE: POC Glucose: 208 mg/dL — ABNORMAL HIGH (ref 65–117)

## 2023-06-08 LAB — COMPREHENSIVE METABOLIC PANEL
ALT: 458 U/L — ABNORMAL HIGH (ref 12–78)
AST: 231 U/L — ABNORMAL HIGH (ref 15–37)
Albumin/Globulin Ratio: 0.9 — ABNORMAL LOW (ref 1.1–2.2)
Albumin: 2.1 g/dL — ABNORMAL LOW (ref 3.5–5.0)
Alk Phosphatase: 178 U/L — ABNORMAL HIGH (ref 45–117)
Anion Gap: 9 mmol/L (ref 2–12)
BUN/Creatinine Ratio: 24 — ABNORMAL HIGH (ref 12–20)
BUN: 34 mg/dL — ABNORMAL HIGH (ref 6–20)
CO2: 17 mmol/L — ABNORMAL LOW (ref 21–32)
Calcium: 7.8 mg/dL — ABNORMAL LOW (ref 8.5–10.1)
Chloride: 110 mmol/L — ABNORMAL HIGH (ref 97–108)
Creatinine: 1.42 mg/dL — ABNORMAL HIGH (ref 0.55–1.02)
Est, Glom Filt Rate: 34 mL/min/{1.73_m2} — ABNORMAL LOW (ref >60)
Globulin: 2.4 g/dL (ref 2.0–4.0)
Glucose: 94 mg/dL (ref 65–100)
Potassium: 4.1 mmol/L (ref 3.5–5.1)
Sodium: 136 mmol/L (ref 136–145)
Total Bilirubin: 0.8 mg/dL (ref 0.2–1.0)
Total Protein: 4.5 g/dL — ABNORMAL LOW (ref 6.4–8.2)

## 2023-06-08 LAB — CULTURE, BLOOD 1

## 2023-06-08 LAB — CULTURE, URINE: Colony count: 25000

## 2023-06-08 MED ORDER — MORPHINE SULFATE (PF) 2 MG/ML IV SOLN
2 | INTRAVENOUS | Status: AC
Start: 2023-06-08 — End: 2023-06-08
  Administered 2023-06-08: 23:00:00 1 mg via INTRAVENOUS

## 2023-06-08 MED ORDER — SODIUM CHLORIDE 0.45 % IV SOLN
0.45 | INTRAVENOUS | Status: DC
Start: 2023-06-08 — End: 2023-06-09
  Administered 2023-06-08: 19:00:00 via INTRAVENOUS

## 2023-06-08 MED ORDER — TRAMADOL HCL 50 MG PO TABS
50 MG | Freq: Two times a day (BID) | ORAL | Status: DC | PRN
Start: 2023-06-08 — End: 2023-06-10
  Administered 2023-06-09 (×2): 25 mg via ORAL

## 2023-06-08 MED ORDER — TRAMADOL HCL 50 MG PO TABS
50 | Freq: Four times a day (QID) | ORAL | Status: DC | PRN
Start: 2023-06-08 — End: 2023-06-08

## 2023-06-08 MED FILL — ATORVASTATIN CALCIUM 10 MG PO TABS: 10 MG | ORAL | Qty: 1

## 2023-06-08 MED FILL — ONDANSETRON HCL 4 MG/2ML IJ SOLN: 42 MG/2ML | INTRAMUSCULAR | Qty: 2

## 2023-06-08 MED FILL — CEFTRIAXONE SODIUM 2 G IJ SOLR: 2 g | INTRAMUSCULAR | Qty: 2000

## 2023-06-08 MED FILL — ACETAMINOPHEN 325 MG PO TABS: 325 MG | ORAL | Qty: 2

## 2023-06-08 MED FILL — MORPHINE SULFATE 2 MG/ML IJ SOLN: 2 mg/mL | INTRAMUSCULAR | Qty: 1

## 2023-06-08 MED FILL — MIRALAX MIX-IN PAX 17 G PO PACK: 17 g | ORAL | Qty: 1

## 2023-06-08 MED FILL — SENNA PLUS 8.6-50 MG PO TABS: 8.6-50 MG | ORAL | Qty: 1

## 2023-06-08 MED FILL — PANTOPRAZOLE SODIUM 40 MG PO TBEC: 40 MG | ORAL | Qty: 1

## 2023-06-08 MED FILL — SODIUM BICARBONATE 8.4 % IV SOLN: 8.4 % | INTRAVENOUS | Qty: 75

## 2023-06-08 MED FILL — VENOFER 20 MG/ML IV SOLN: 20 MG/ML | INTRAVENOUS | Qty: 10

## 2023-06-08 NOTE — Care Coordination-Inpatient (Signed)
Transition of Care Plan:    RUR: 20%  Prior Level of Functioning: Resides at [name].  Disposition: [facility].  CM spoke to [place] in admissions [phone]. They will accept patient when medically ready.  EDD: 2/8  If SNF or IPR: Date FOC offered: 2/3  Date FOC received: 2/3  Accepting facility: [facility]  Date authorization started with reference number:   Date authorization received and expires:   Follow up appointments: PCP/Specialist  DME needed: None  Transportation at discharge: BLS  IM/IMM Medicare/Tricare letter given: 2/3  Is patient a Veteran and connected with VA?    If yes, was [employer] transfer form completed and VA notified?   Caregiver Contact: Kellie Simmering [phone]   Discharge Caregiver contacted prior to discharge?   Care Conference needed? No  Barriers to discharge:  Medical stability    Cheree Ditto, RN/CRM  [phone]

## 2023-06-08 NOTE — Significant Event (Signed)
Rapid Response  Rapid response room 564 called overhead at 1753. RRT responding.    Rapid response called for chest pain and shortness of breath    Pt reporting mid chest pain with shortness of breath. Pt was reportedly 96% on RA, placed on 2 L NC for comfort.    Order given for EKG and troponin.    Dr. Anastasia Fiedler covering, at bedside. Orders for CXR and morphine given.    Morphine given by primary RN, see MAR for details.    EKG completed, given to provider to review. Pt has h/o LBBB.    New 22g PIV R wrist placed by this RN. Troponin drawn from new PIV, sent to lab by staff.    Pt to be cleaned up by staff.    Checked in with primary RN prior to leaving. Opportunity for questions and concerns provided.      Rapid Response Team Sepsis Screening  Is the patient's history suggestive of a new infection? No    Are two or more SIRS criteria present? No    Is there evidence of Organ Dysfunction? [facility] Sepsis OD: None    Communication with provider: No    Was a Code Sepsis called at this encounter? No. Why? Not indicated at this time.

## 2023-06-08 NOTE — Progress Notes (Signed)
Renal Dosing/Monitoring  Medication: Tramadol   Current regimen:  25mg  every 6 hr PRN  Recent Labs     06/06/23  0553 06/07/23  0557 06/08/23  0309   CREATININE 2.08* 1.58* 1.42*   BUN 41* 39* 34*     Estimated CrCl:  21 ml/min  Plan: Change to Tramadol 25mg  q12h PRN with respect to indication and renal status (CrCl <30 mL/min) per [facility]EC/P&T-approved Renal Dosing Adjustment protocol.  Pharmacy will continue to monitor this patient daily for changes in clinical status and renal function.

## 2023-06-08 NOTE — Progress Notes (Signed)
RRT called on pt at 1749 when pt c/o chest pain and SOB to Amrita, RN. RRT responded as well as Dr. Frutoso Chase. Vitals taken 1745 BP 94/72, pulse 119. Then 1800 BP 107/60, 1815 110/64. Sodium Chloride IV stopped, 1mg  morphine given. EKG completed showing A-Fib and left BBB, consistent with her history. CXR ordered by Dr. Frutoso Chase, pending. Communication provided amongst team and granddaughter Denny Peon. Patient stablized, CXR pending. Blood drawn and Troponin ordered, pending. Note pt is DNR. Will update patient, team and Dr. As advised. Pt also changed due to BM in diaper. Will update. Lequita Halt, RN

## 2023-06-08 NOTE — Progress Notes (Cosign Needed)
Hospitalist Progress Note  Delight Stare, APRN - NP  Answering service: [phone] OR [phone] from in house phone        Date of Service:  06/08/2023  NAME:  Mary Woodward  DOB:  04/16/1928  MRN:  [id number]      Admission Summary:   Per H&P 2/2   Mary Woodward is a 89 y.o. female with with pmh of paroxysmal atrial fibrillation, HFpEF, pulmonary edema, hypertension, hyperlipidemia, PE, osteoarthritis, osteoporosis who presented to ed with complaints of abdominal pain, nausea and vomiting. Some cramping abdominal pain and constipation over the last week with nausea and non-bloody, non bilious emesis over the last 2 days.  Also with poor po intake, malaise and fatigue. Suffered a recent fall with a pelvic fracture; treated conservatively. Has had difficulty ambulating as a result.     The patient denies any fever, chills, chest pain, cough, congestion, recent illness, palpitations, or dysuria.  Remarkable vitals on ER Presentation: vss  Labs Remarkable for: wbc 25.6, hgb 8.6, na 128, cr 2.73, t-bili 2.1, alt 909, ast 926, alk phos 216, alb 2.6, UA: grossly infected.  ER Images: ct abd et pelvis: no acute process. Sludge in gallbladder  ER Rx: vanc, cefepime, 2.3l ns bolus, zofran    Interval history / Subjective:     I saw the patient today on rounds.  Daughter at the bedside at the moment of the encounter.  Patient with complaints of some back pain     Assessment & Plan:         Acute Cystitis/pyelonephritis  Pyelonephritis as seen on imaging, MRI  Urine culture with GNR so far  Continuing ceftriaxone         Cholestasis / Hyperbilirubinemia / Transaminitis  Gallbladder sludge on CT  MRCP has been done  GI following, appreciate recommendations  Labs today with improvement in transaminases, T. bili is normal         Bacteremia   Possible E. coli  Continuing ceftriaxone  Possible urinary source  Infectious disease following,  appreciate recommendations         Constipation  -miralax and senna-kot  -mivf     NAGMA  Bicarbonate was 15 yesterday  Labs today with bicarbonate of 17, creatinine is 1.42  Continue LR  I have consulted with nephrology  Nephrology has seen the patient, bicarbonate drip started     Anemia  Hemoglobin 7.3  Iron significantly low  Will give iron sucrose     HFpEF  Does not appear to be in exacerbation  Continue to monitor     AKI on CKD III  Not far from baseline     Hyponatremia   This is chronic issue  Improved, sodium 132 today     Afib / Hx of PE  Heart rate currently controlled without medications  Does not appear to be on anticoagulation at home, was previously on rivaroxaban however not felt from her pharmacy for over 1 year     HTN  Blood pressure currently controlled without medications     GERD / Dyslipidemia   Pantoprazole and atorvastatin       Code status: DNR  Prophylaxis: SCD  Care Plan discussed with: Patient, nurse, case manager, ID  Anticipated Disposition: Pending progression     Principal Problem:    Nausea and vomiting  Active Problems:    Bacteremia  Resolved Problems:    * No resolved hospital problems. *  Review of Systems:   Pertinent items are noted in HPI.         Vital Signs:    Last 24hrs VS reviewed since prior progress note. Most recent are:  BP (!) 111/58   Pulse 97   Temp 98.6 F (37 C) (Oral)   Resp 17   Ht 1.524 m (5')   Wt 67 kg (147 lb 11.3 oz)   SpO2 96%   BMI 28.85 kg/m        Intake/Output Summary (Last 24 hours) at 06/08/2023 1610  Last data filed at 06/08/2023 0200  Gross per 24 hour   Intake 80 ml   Output 350 ml   Net -270 ml        Physical Examination:     I had a face to face encounter with this patient and independently examined them on 06/08/2023 as outlined below:          General :  no acute distress,   HEENT: NCAT moist mucus membrane  Neck: supple, no JVD,   Chest: CTA   CVS: RRR S1 S2   Abd: soft NT ND BS+   Ext:  no edema  Neuro/Psych: pleasant mood  and affect, PERLA EOMI MAE  Skin: warm              Data Review:    Review and/or order of clinical lab test  Review and/or order of tests in the radiology section of CPT  I personally reviewed  Image    I have independently reviewed and interpreted patient's lab and all other diagnostic data    Notes reviewed from all clinical/nonclinical/nursing services involved in patient's clinical care. Care coordination discussions were held with appropriate clinical/nonclinical/ nursing providers based on care coordination needs.     Labs:     Recent Labs     06/07/23  0557 06/08/23  0309   WBC 12.7* 9.7   HGB 7.7* 7.3*   HCT 24.2* 23.1*   PLT 372 379     Recent Labs     06/06/23  0553 06/07/23  0557 06/08/23  0309   NA 132* 136 136   K 4.5 3.9 4.1   CL 107 111* 110*   CO2 16* 15* 17*   BUN 41* 39* 34*     Recent Labs     06/06/23  0553 06/07/23  0557 06/08/23  0309   ALT 919* 672* 458*   GLOB 3.8 3.5 2.4     No results for input(s): "INR", "APTT" in the last 72 hours.    Invalid input(s): "PTP"   Recent Labs     06/06/23  0209   TIBC 236*      No results found for: "RBCF"   No results for input(s): "PH", "PCO2", "PO2" in the last 72 hours.  No results for input(s): "CPK" in the last 72 hours.    Invalid input(s): "CPKMB", "CKNDX", "TROIQ"  No results found for: "CHOL", "CHLST", "CHOLV", "HDL", "HDLC", "LDL", "LDLC"  No results found for: "GLUCPOC"        Medications Reviewed:     Current Facility-Administered Medications   Medication Dose Route Frequency    iron sucrose (VENOFER) injection 200 mg  200 mg IntraVENous Q24H    polyethylene glycol (GLYCOLAX) packet 17 g  17 g Oral BID    sennosides-docusate sodium (SENOKOT-S) 8.6-50 MG tablet 2 tablet  2 tablet Oral BID    atorvastatin (LIPITOR) tablet 10 mg  10 mg Oral  Daily    pantoprazole (PROTONIX) tablet 40 mg  40 mg Oral QAM AC    sodium chloride flush 0.9 % injection 5-40 mL  5-40 mL IntraVENous 2 times per day    sodium chloride flush 0.9 % injection 5-40 mL  5-40 mL  IntraVENous PRN    0.9 % sodium chloride infusion   IntraVENous PRN    ondansetron (ZOFRAN-ODT) disintegrating tablet 4 mg  4 mg Oral Q8H PRN    Or    ondansetron (ZOFRAN) injection 4 mg  4 mg IntraVENous Q6H PRN    polyethylene glycol (GLYCOLAX) packet 17 g  17 g Oral Daily PRN    acetaminophen (TYLENOL) tablet 650 mg  650 mg Oral Q6H PRN    Or    acetaminophen (TYLENOL) suppository 650 mg  650 mg Rectal Q6H PRN    sodium chloride 0.9 % bolus 100 mL  100 mL IntraVENous Once    cefTRIAXone (ROCEPHIN) 2,000 mg in sterile water 20 mL IV syringe  2,000 mg IntraVENous Q24H    lactated ringers infusion   IntraVENous Continuous     ______________________________________________________________________  EXPECTED LENGTH OF STAY: Unable to retrieve estimated LOS  ACTUAL LENGTH OF STAY:          2                 Delight Stare, APRN - NP

## 2023-06-08 NOTE — Plan of Care (Signed)
Problem: Occupational Therapy - Adult  Goal: By Discharge: Performs self-care activities at highest level of function for planned discharge setting.  See evaluation for individualized goals.  Description: FUNCTIONAL STATUS PRIOR TO ADMISSION:  Patient was ambulatory using Rollator.  Recently at [facility] for rehab secondary to pelvic fracture 05/22/2023  Receives Help From: Other (Comment), Prior Level of Assist for ADLs: Needs assistance,  ,  ,  ,  , Toileting: Independent,  , Ambulation Assistance: Needs assistance, Prior Level of Assist for Transfers: Needs assistance, Active Driver: No     HOME SUPPORT: Patient admitted from [facility] rehab, with hopes to return.  Lives at the [facility] at baseline.    Occupational Therapy Goals:  Initiated 06/06/2023  1.  Patient will perform RW grooming with Contact Guard Assist within 7 day(s).  2.  Patient will perform bathing with Minimal Assist within 7 day(s).  3.  Patient will perform RW lower body dressing with Minimal Assist within 7 day(s).  4.  Patient will perform RW toilet transfers with Contact Guard Assist  within 7 day(s).  5.  Patient will perform all aspects of RW toileting with Contact Guard Assist within 7 day(s).        Outcome: Progressing   OCCUPATIONAL THERAPY TREATMENT  Patient: Mary Woodward (89 y.o. female)  Date: 06/08/2023  Primary Diagnosis: Nausea and vomiting [R11.2]  Elevated LFTs [R79.89]  AKI (acute kidney injury) (HCC) [N17.9]  Acute cystitis with hematuria [N30.01]  Sepsis without acute organ dysfunction, due to unspecified organism [facility]) [A41.9]  Bacteremia [R78.81]       Precautions: Fall Risk                Chart, occupational therapy assessment, plan of care, and goals were reviewed.    ASSESSMENT  Patient continues to benefit from skilled OT services and is progressing towards goals. Pt's performance of ADL/IADL tasks is limited at this time by impaired balance, activity tolerance, generalized weakness, LB reach/access, pain, and  cognition (insight, safety awareness, memory). Pt received semi-supine and amenable to participation in therapy session. She continues to require max A, additional time, and verbal cues to complete bed mobility in prep for ADL tasks. Initially pt demonstrated poor sitting balance, however improved with time and cueing to utilize BUEs for support on EOB. Pt noted to be incontinent of stool requiring total A for LB bathing, toileting, and dressing routines. Sit<>stand transfer and stand pivot to bedside chair completed with mod A x1 and use of RW. Once seated pt reclined and positioned for comfort. All VSS throughout session on RA today. RN notified of session/pt status. Continue to recommend skilled therapy services at next level of care.          PLAN :  Patient continues to benefit from skilled intervention to address the above impairments.  Continue treatment per established plan of care to address goals.    Recommend with staff: OOB to chair with x2 person assist and use of gait belt and RW, completion of toileting on [facility] with direct supervision throughout toileting routine     Recommendation for discharge: (in order for the patient to meet his/her long term goals):   Moderate intensity short-term skilled occupational therapy up to 5x/week    Other factors to consider for discharge: patient's current support system is unable to meet their requirements for physical assistance, high risk for falls, and concern for safely navigating or managing the home environment    IF patient discharges home will need  the following DME: continuing to assess with progress       SUBJECTIVE:   Patient stated "I feel a little better today I guess."    OBJECTIVE DATA SUMMARY:   Cognitive/Behavioral Status:  Orientation  Overall Orientation Status: Within Normal Limits  Orientation Level: Oriented X4  Cognition  Overall Cognitive Status: WFL    Functional Mobility and Transfers for ADLs:  Bed Mobility:  Bed Mobility Training  Bed  Mobility Training: Yes  Sit to Supine: Substantial/Maximal assistance     Transfers:   Transfer Training  Transfer Training: Yes  Sit to Stand: Partial/Moderate assistance  Stand to Sit: Partial/Moderate assistance  Bed to Chair: Partial/Moderate assistance           Balance:     Balance  Sitting: Impaired  Sitting - Static: Good (unsupported);Fair (occasional)  Sitting - Dynamic: Fair (occasional)  Standing: Impaired  Standing - Static: Fair;Constant support  Standing - Dynamic: Fair;Poor;Constant support      ADL Intervention:        Grooming: Setup   Grooming Skilled Clinical Factors: seated in bedside chair, to wash hands in prep for lunch    LE Bathing: Dependent/Total  LE Bathing Skilled Clinical Factors: for posterior hygiene following episode of bowel incontinence    LE Dressing: Dependent/Total  LE Dressing Skilled Clinical Factors: to don socks seated EOB and to don/doff brief    Toileting: Dependent/Total  Toileting Skilled Clinical Factors: incontinent of stool, requiring total A for hygiene standing from EOB        Pain Rating:  10/10 - abdominal pain with mobility   Pain Intervention(s):   repositioning      Activity Tolerance:   Fair   Please refer to the flowsheet for vital signs taken during this treatment.    After treatment:   Patient left in no apparent distress sitting up in chair, Call bell within reach, Bed/ chair alarm activated, Caregiver / family present, and Heels elevated for pressure relief    COMMUNICATION/EDUCATION:   The patient's plan of care was discussed with: registered nurse    Patient Education  Education Given To: Patient;Family  Education Provided: Role of Therapy;Plan of Care;ADL Adaptive Strategies;Precautions;Energy Programme researcher, broadcasting/film/video;Fall Prevention Strategies  Education Method: Demonstration;Verbal  Barriers to Learning: None  Education Outcome: Verbalized understanding;Continued education needed    Thank you for this referral.  Gwendolyn Lima,  OTD, OTR/L  Minutes: 35

## 2023-06-08 NOTE — Progress Notes (Addendum)
Rapid response for chest pain.  Pt seen and examined, reports L-sided chest pain, began suddenly. Denies dyspnea but appears tachypneic and has mild increased work of breathing    BP 94/72   Pulse (!) 119   Temp 97.7 F (36.5 C)   Resp 15   Ht 1.524 m (5')   Wt 67 kg (147 lb 11.3 oz)   SpO2 97%   BMI 28.85 kg/m     RRR  Lungs w/ feint b/l crackles  Abd soft NT    -stat xr pending  -troponin  -pause IVF pending XR  -EKG shows afib w/ rvr + LBBB (old) in the 110s, give IV morphine and reassess HR before adding rate control meds as BP soft

## 2023-06-08 NOTE — Plan of Care (Signed)
Problem: Safety - Adult  Goal: Free from fall injury  06/08/2023 0904 by Joanna Hews, RN  Outcome: Progressing  06/08/2023 0324 by Cameron Proud, RN  Outcome: Progressing     Problem: Discharge Planning  Goal: Discharge to home or other facility with appropriate resources  06/08/2023 0904 by Joanna Hews, RN  Outcome: Progressing  06/08/2023 0324 by Cameron Proud, RN  Outcome: Progressing     Problem: Pain  Goal: Verbalizes/displays adequate comfort level or baseline comfort level  06/08/2023 0904 by Joanna Hews, RN  Outcome: Progressing  06/08/2023 0324 by Cameron Proud, RN  Outcome: Progressing     Problem: Skin/Tissue Integrity  Goal: Skin integrity remains intact  Description: 1.  Monitor for areas of redness and/or skin breakdown  2.  Assess vascular access sites hourly  3.  Every 4-6 hours minimum:  Change oxygen saturation probe site  4.  Every 4-6 hours:  If on nasal continuous positive airway pressure, respiratory therapy assess nares and determine need for appliance change or resting period  06/08/2023 0904 by Joanna Hews, RN  Outcome: Progressing  06/08/2023 0324 by Cameron Proud, RN  Outcome: Progressing

## 2023-06-08 NOTE — Progress Notes (Signed)
Infectious Diseases Follow Up    06/08/2023    INFECTIOUS DISEASE Attending:     I agree with the above infectious disease daily progress note in its entirety as authored by and discussed in detail with the nurse practitioner.   I have reviewed pertinent laboratory studies, microbiology cultures, radiologic reports with review of the consultations and progress notes as appropriate.   I agree with today's subjective findings, physical examination, assessment and plan of care as described above and discussed extensively with the nurse practitioner.       Feels better overall less nauseated.    Exam with patient in NAD, supple neck, normal HR and Nl respiratory effort, mild lower abdominal pain.    Plan:    Continue Ceftriaxone while inpatient.    Transition to Keflex 500mg  PO BID when ready for eventual discharge.    Timed voiding q2 hours outpatient to prevent urinary retention.    Reviewed antibiotic resistance panel and E coli sensitivities.    A total time of 20 minutes was spent on today's encounter.  Greater than 50% of the time was spent on the following:  Preparing for visit and chart review.  Obtaining and/or reviewing separately obtained history  Performing a medically appropriate exam and/or evaluation  Counseling and educating a patient/family/caregiver as noted above  Placing relevant orders  Referring and communicating with other professionals (not separately reported)  Independently interpreting results (not separately reported) and communicating results to the patient/family/caregiver  Care coordination (not separately reported) as noted above  Documenting clinical information in the electronic health records (e.g. problem list, visit note) on the day of the encounter      Assessment & Plan:     89 y/o female with sepsis with E coli bacteremia and transaminitis.    E.coli bacteremia  Transaminitis  Leukocytosis  Urine cx GNR  Repeat blood cx 2/4 NGTD  Suspected urinary source and pyelonephritis.      Continue ceftriaxone     Follow urine culture. Follow blood cultures     Trend liver enzymes and bilirubin - improving       D/w Dr. Dagoberto Ligas, pt        Anti-infectives:   ceftriaxone    Subjective:   Denies nausea. Claims has no had bowel movement    Objective:     Vitals: BP 113/67   Pulse (!) 104   Temp 97.7 F (36.5 C) (Oral)   Resp 17   Ht 1.524 m (5')   Wt 67 kg (147 lb 11.3 oz)   SpO2 95%   BMI 28.85 kg/m      Tmax:  Temp (24hrs), Avg:98.6 F (37 C), Min:97.7 F (36.5 C), Max:99.1 F (37.3 C)      Exam:   Patient is intubated:  no    Exam:        GEN: NAD  HEENT: Normocephalic, atraumatic  CV: normal rate  Lungs: Nl effort  Abdomen: soft, non distended, non tender  Genitourinary: no foley  Extremities: no edema  Neuro: Alert, oriented to time, place and situation, moves all extremities to commands, verbal   Skin: no rash  Psych: good affect, pleasant       Labs:        Invalid input(s): "ITNL"   No results for input(s): "CPK", "CKMB" in the last 72 hours.    Invalid input(s): "TROIQ"  Recent Labs     06/06/23  0553 06/07/23  0557 06/08/23  0309   NA 132* 136  136   K 4.5 3.9 4.1   CL 107 111* 110*   CO2 16* 15* 17*   BUN 41* 39* 34*   WBC 21.5* 12.7* 9.7   HGB 8.6* 7.7* 7.3*   HCT 27.8* 24.2* 23.1*   PLT 342 372 379     No results for input(s): "INR", "APTT" in the last 72 hours.    Invalid input(s): "PTP"          Cultures:   @MICRORESULTS @      Radiology:     Medications:          Current Facility-Administered Medications:     iron sucrose (VENOFER) injection 200 mg, 200 mg, IntraVENous, Q24H, Hill, Sharion Dove, APRN - NP, 200 mg at 06/07/23 9147    polyethylene glycol (GLYCOLAX) packet 17 g, 17 g, Oral, BID, Ngwafang, Bleck B, MD, 17 g at 06/07/23 0816    sennosides-docusate sodium (SENOKOT-S) 8.6-50 MG tablet 2 tablet, 2 tablet, Oral, BID, Ngwafang, Bleck B, MD, 2 tablet at 06/07/23 0816    atorvastatin (LIPITOR) tablet 10 mg, 10 mg, Oral, Daily, Ngwafang, Bleck B, MD, 10 mg at 06/08/23 0825     pantoprazole (PROTONIX) tablet 40 mg, 40 mg, Oral, QAM AC, Ngwafang, Bleck B, MD, 40 mg at 06/08/23 0611    sodium chloride flush 0.9 % injection 5-40 mL, 5-40 mL, IntraVENous, 2 times per day, Ngwafang, Bleck B, MD, 10 mL at 06/08/23 0826    sodium chloride flush 0.9 % injection 5-40 mL, 5-40 mL, IntraVENous, PRN, Ngwafang, Bleck B, MD    0.9 % sodium chloride infusion, , IntraVENous, PRN, Ngwafang, Bleck B, MD    ondansetron (ZOFRAN-ODT) disintegrating tablet 4 mg, 4 mg, Oral, Q8H PRN **OR** ondansetron (ZOFRAN) injection 4 mg, 4 mg, IntraVENous, Q6H PRN, Ngwafang, Bleck B, MD, 4 mg at 06/07/23 2155    polyethylene glycol (GLYCOLAX) packet 17 g, 17 g, Oral, Daily PRN, Ngwafang, Bleck B, MD    acetaminophen (TYLENOL) tablet 650 mg, 650 mg, Oral, Q6H PRN, 650 mg at 06/07/23 2142 **OR** acetaminophen (TYLENOL) suppository 650 mg, 650 mg, Rectal, Q6H PRN, Ngwafang, Bleck B, MD    sodium chloride 0.9 % bolus 100 mL, 100 mL, IntraVENous, Once, Ngwafang, Bleck B, MD    cefTRIAXone (ROCEPHIN) 2,000 mg in sterile water 20 mL IV syringe, 2,000 mg, IntraVENous, Q24H, Eola Waldrep, Sharlet Salina, MD, 2,000 mg at 06/08/23 0825    lactated ringers infusion, , IntraVENous, Continuous, Richelle Ito, PA-C, Last Rate: 75 mL/hr at 06/07/23 2142, New Bag at 06/07/23 2142    Thank you for the opportunity to participate in the care of this patient.      A total time of 15 minutes was spent on today's encounter.  Greater than 50% of the time was spent on the following:  Preparing for visit and chart review.  Obtaining and/or reviewing separately obtained history  Performing a medically appropriate exam and/or evaluation  Counseling and educating a patient/family/caregiver as noted above  Placing relevant orders  Referring and communicating with other professionals (not separately reported)  Independently interpreting results (not separately reported) and communicating results to the patient/family/caregiver  Care coordination (not separately  reported) as noted above  Documenting clinical information in the electronic health records (e.g. problem list, visit note) on the day of the encounter     Lance Muss, APRN - NP

## 2023-06-08 NOTE — Plan of Care (Signed)
Problem: Safety - Adult  Goal: Free from fall injury  Outcome: Progressing     Problem: Discharge Planning  Goal: Discharge to home or other facility with appropriate resources  Outcome: Progressing     Problem: Pain  Goal: Verbalizes/displays adequate comfort level or baseline comfort level  Outcome: Progressing     Problem: Skin/Tissue Integrity  Goal: Skin integrity remains intact  Description: 1.  Monitor for areas of redness and/or skin breakdown  2.  Assess vascular access sites hourly  3.  Every 4-6 hours minimum:  Change oxygen saturation probe site  4.  Every 4-6 hours:  If on nasal continuous positive airway pressure, respiratory therapy assess nares and determine need for appliance change or resting period  Outcome: Progressing     Problem: Occupational Therapy - Adult  Goal: By Discharge: Performs self-care activities at highest level of function for planned discharge setting.  See evaluation for individualized goals.  Description: FUNCTIONAL STATUS PRIOR TO ADMISSION:  Patient was ambulatory using Rollator.  Recently at [facility] for rehab secondary to pelvic fracture 05/22/2023  Receives Help From: Other (Comment), Prior Level of Assist for ADLs: Needs assistance,  ,  ,  ,  , Toileting: Independent,  , Ambulation Assistance: Needs assistance, Prior Level of Assist for Transfers: Needs assistance, Active Driver: No     HOME SUPPORT: Patient admitted from [facility] rehab, with hopes to return.  Lives at the [facility]F at baseline.    Occupational Therapy Goals:  Initiated 06/06/2023  1.  Patient will perform RW grooming with Contact Guard Assist within 7 day(s).  2.  Patient will perform bathing with Minimal Assist within 7 day(s).  3.  Patient will perform RW lower body dressing with Minimal Assist within 7 day(s).  4.  Patient will perform RW toilet transfers with Contact Guard Assist  within 7 day(s).  5.  Patient will perform all aspects of RW toileting with Contact Guard Assist within 7  day(s).        06/07/2023 1502 by Gwendolyn Lima, OT  Outcome: Not Progressing

## 2023-06-08 NOTE — Consults (Signed)
NEPHROLOGY CONSULT NOTE     Patient: Mary Woodward MRN: [id number]  PCP: Darrell Jewel, MD   DOB:     1927/06/10  Age:   89 y.o.  Sex:  female      Referring physician: Rebeca Allegra, MD  Reason for consultation: 89 y.o. female with Nausea and vomiting [R11.2]  Elevated LFTs [R79.89]  AKI (acute kidney injury) (HCC) [N17.9]  Acute cystitis with hematuria [N30.01]  Sepsis without acute organ dysfunction, due to unspecified organism [facility]) [A41.9]  Bacteremia [R78.81] complicated by AKI   Admission Date: 06/05/2023  9:10 PM  LOS: 2 days      ASSESSMENT and PLAN :   AKI:  - from IVVD in the setting of N/V and pyelo  - improving, close to baseline  - cont IVF - change to bcb   - daily labs and I/Os    CKD 3b:  - baseline Cr 1.4 to 1.6  - not f/b a nephrologist    NGMA:  - 2/2 renal failure  - change IVF to bcb drip  - expect this to resolve once AKI/infection improving    Transaminitis  Cholestasis    Pyelonephritis:  - GNR in urine and blood    E. Coli bacteremia     Active Problems / Assessment AAActive  :   Principal Problem:    Nausea and vomiting  Active Problems:    Bacteremia    Sepsis due to Escherichia coli (HCC)    Elevated LFTs  Resolved Problems:    * No resolved hospital problems. *       Subjective:   HPI: Mary Woodward is a 89 y.o. Caucasian female who has been admitted to the hospital for abd pain, n/v/d.  She has a hx of afib, HFpEF, hx of PE, HTN, HLD, osteoporosis.  Found to have AKI with Cr of 2.7.  baseline Cr appears to be around 1.4 to 1.6.  imaging shows possible pyelo on the R.  GNR rods on culture.  On abx now, IV LR.  Has NAGMA, bcb 17 today.  No diarrhea reported.    Past Medical Hx:   No past medical history on file.     Past Surgical Hx:   No past surgical history on file.    Medications:  Prior to Admission medications    Medication Sig Start Date End Date Taking? Authorizing Provider   lovastatin (MEVACOR) 10 MG tablet Take 1 tablet by mouth nightly   Yes [provider]    metoprolol succinate (TOPROL XL) 50 MG extended release tablet Take 1 tablet by mouth daily Hold for SBP<100   Yes [provider]   pantoprazole (PROTONIX) 40 MG tablet Take 1 tablet by mouth in the morning and at bedtime   Yes [provider]   ondansetron (ZOFRAN) 4 MG tablet Take 1 tablet by mouth in the morning and at bedtime   Yes [provider]   ondansetron (ZOFRAN) 4 MG tablet Take 1 tablet by mouth every 8 hours as needed for Nausea or Vomiting   Yes [provider]   polyethylene glycol (MIRALAX) 17 g PACK packet Take 17 g by mouth daily   Yes [provider]   ferrous sulfate (FE TABS 325) 325 (65 Fe) MG EC tablet Take 1 tablet by mouth every other day On even days   Yes [provider]   estradiol (ESTRACE) 0.1 MG/GM vaginal cream Place 0.5 g vaginally once a week On Mondays  Yes [provider]   acetaminophen (TYLENOL) 500 MG tablet Take 1 tablet by mouth in the morning, at noon, and at bedtime   Yes [provider]   acetaminophen (TYLENOL) 500 MG tablet Take 1 tablet by mouth every 6 hours as needed for Pain   Yes [provider]   losartan (COZAAR) 25 MG tablet Take 1 tablet by mouth in the morning and at bedtime Hold for SBP<100   Yes [provider]   amiodarone (CORDARONE) 200 MG tablet Take 1 tablet by mouth 2 times daily for 3 days, THEN 1 tablet daily for 27 days. 1 tablet twice a day (9 am and 9 pm) through 6/12, then 1 tablet daily (9 am) going forward. 10/11/22 11/10/22  Simonne Maffucci, Theophilus Kinds, MD   apixaban Everlene Balls) 2.5 MG TABS tablet Take 1 tablet by mouth 2 times daily 10/11/22   Simonne Maffucci, Theophilus Kinds, MD   empagliflozin (JARDIANCE) 10 MG tablet Take 1 tablet by mouth daily 09/21/22   Ronnie Derby, MD   Multiple Vitamins-Minerals (PRESERVISION AREDS 2) CHEW Take 1 tablet by mouth daily    [provider]       No Known Allergies    Social Hx:  reports that she has quit smoking. Her  smoking use included cigarettes. She has been exposed to tobacco smoke. She has never used smokeless tobacco. She reports current alcohol use of about 2.0 standard drinks of alcohol per week. She reports that she does not use drugs.     No family history on file.    Review of Systems:  A twelve point review of system was performed today. Pertinent positives and negatives are mentioned in the HPI. The reminder of the ROS is negative and noncontributory.     Objective:    Vitals:    Vitals:    06/08/23 0830 06/08/23 1000 06/08/23 1123 06/08/23 1141   BP: 113/67  113/73 107/63   Pulse: (!) 104 (!) 115 (!) 116 (!) 122   Resp:       Temp: 97.7 F (36.5 C)      TempSrc: Oral      SpO2: 95%      Weight:       Height:         I&O's:  02/04 0701 - 02/05 0700  In: 230 [P.O.:230]  Out: 350 [Urine:350]  BP 107/63   Pulse (!) 122   Temp 97.7 F (36.5 C) (Oral)   Resp 17   Ht 1.524 m (5')   Wt 67 kg (147 lb 11.3 oz)   SpO2 95%   BMI 28.85 kg/m     Physical Exam:  General:Alert, No distress,   HEENT: Eyes are PERRL.  Conjunctiva without pallor ,erythema.  The sclerae without icterus. .   Neck:Supple,no mass palpable  Lungs : Clears to auscultation Bilaterally, Normal respiratory effort  CVS: RRR, S1 S2 normal, No rub, no LE edema  Abdomen: Soft, Non tender, No hepatosplenomegaly, bowel sounds present  Extremities: No cyanosis, No clubbing  Skin: No rash or lesions.  Lymph nodes: No palpable nodes  MS: No joint swelling, erythema, warmth  Neurologic: non focal, AAO x 3  Psych: normal affect    Laboratory Results:    Lab Results   Component Value Date    CREATININE 1.42 (H) 06/08/2023    BUN 34 (H) 06/08/2023    NA 136 06/08/2023    K 4.1 06/08/2023    CL 110 (H) 06/08/2023  CO2 17 (L) 06/08/2023       Lab Results   Component Value Date    CREATININE 1.42 (H) 06/08/2023    CREATININE 1.58 (H) 06/07/2023    CREATININE 2.08 (H) 06/06/2023    CREATININE 2.73 (H) 06/05/2023    CREATININE 2.18 (H) 10/11/2022    BUN 34 (H)  06/08/2023    BUN 39 (H) 06/07/2023    BUN 41 (H) 06/06/2023    BUN 47 (H) 06/05/2023    BUN 56 (H) 10/11/2022    K 4.1 06/08/2023    K 3.9 06/07/2023    K 4.5 06/06/2023    K 4.8 06/05/2023    K 4.5 10/11/2022       Lab Results   Component Value Date    WBC 9.7 06/08/2023    RBC 2.81 (L) 06/08/2023    HGB 7.3 (L) 06/08/2023    HCT 23.1 (L) 06/08/2023    MCV 82.2 06/08/2023    MCH 26.0 06/08/2023    RDW 21.9 (H) 06/08/2023    PLT 379 06/08/2023       Lab Results   Component Value Date    CALCIUM 7.8 (L) 06/08/2023       Urine dipstick:   No results found for: "COLOR", "CASTS"          Thank you for allowing [place] to participate in the care of this patient.   We will follow patient. Please don't hesitate to call with any questions    Sallyanne Havers, MD  06/08/2023  [facility]   [address] [place], [place] [address]  Phone - [phone]   Fax - [phone]  [email or web address]

## 2023-06-09 ENCOUNTER — Inpatient Hospital Stay: Admit: 2023-06-09 | Payer: MEDICARE | Primary: Internal Medicine

## 2023-06-09 LAB — EXTRA TUBES HOLD

## 2023-06-09 LAB — CBC WITH AUTO DIFFERENTIAL
Basophils %: 0.4 % (ref 0.0–1.0)
Basophils Absolute: 0.04 10*3/uL (ref 0.00–0.10)
Eosinophils %: 0.8 % (ref 0.0–7.0)
Eosinophils Absolute: 0.08 10*3/uL (ref 0.00–0.40)
Hematocrit: 24.3 % — ABNORMAL LOW (ref 35.0–47.0)
Hemoglobin: 7.7 g/dL — ABNORMAL LOW (ref 11.5–16.0)
Immature Granulocytes %: 1.4 % — ABNORMAL HIGH (ref 0.0–0.5)
Immature Granulocytes Absolute: 0.15 10*3/uL — ABNORMAL HIGH (ref 0.00–0.04)
Lymphocytes %: 10 % — ABNORMAL LOW (ref 12.0–49.0)
Lymphocytes Absolute: 1.06 10*3/uL (ref 0.80–3.50)
MCH: 26.2 pg (ref 26.0–34.0)
MCHC: 31.7 g/dL (ref 30.0–36.5)
MCV: 82.7 fL (ref 80.0–99.0)
MPV: 10.6 fL (ref 8.9–12.9)
Monocytes %: 12.5 % (ref 5.0–13.0)
Monocytes Absolute: 1.33 10*3/uL — ABNORMAL HIGH (ref 0.00–1.00)
Neutrophils %: 74.9 % (ref 32.0–75.0)
Neutrophils Absolute: 7.94 10*3/uL (ref 1.80–8.00)
Nucleated RBCs: 0.3 /100{WBCs} — ABNORMAL HIGH
Platelets: 416 10*3/uL — ABNORMAL HIGH (ref 150–400)
RBC: 2.94 M/uL — ABNORMAL LOW (ref 3.80–5.20)
RDW: 22.1 % — ABNORMAL HIGH (ref 11.5–14.5)
WBC: 10.6 10*3/uL (ref 3.6–11.0)
nRBC: 0.03 10*3/uL — ABNORMAL HIGH (ref 0.00–0.01)

## 2023-06-09 LAB — RENAL FUNCTION PANEL
Albumin: 2.2 g/dL — ABNORMAL LOW (ref 3.5–5.0)
Anion Gap: 8 mmol/L (ref 2–12)
BUN/Creatinine Ratio: 23 — ABNORMAL HIGH (ref 12–20)
BUN: 32 mg/dL — ABNORMAL HIGH (ref 6–20)
CO2: 20 mmol/L — ABNORMAL LOW (ref 21–32)
Calcium: 8 mg/dL — ABNORMAL LOW (ref 8.5–10.1)
Chloride: 108 mmol/L (ref 97–108)
Creatinine: 1.39 mg/dL — ABNORMAL HIGH (ref 0.55–1.02)
Est, Glom Filt Rate: 35 mL/min/{1.73_m2} — ABNORMAL LOW (ref >60)
Glucose: 87 mg/dL (ref 65–100)
Phosphorus: 2.3 mg/dL — ABNORMAL LOW (ref 2.6–4.7)
Potassium: 4 mmol/L (ref 3.5–5.1)
Sodium: 136 mmol/L (ref 136–145)

## 2023-06-09 LAB — TROPONIN
Troponin, High Sensitivity: 210 ng/L (ref 0–51)
Troponin, High Sensitivity: 212 ng/L (ref 0–51)
Troponin, High Sensitivity: 213 ng/L (ref 0–51)

## 2023-06-09 LAB — LACTIC ACID

## 2023-06-09 LAB — EKG 12-LEAD
Atrial Rate: 69 {beats}/min
Q-T Interval: 420 ms
QRS Duration: 172 ms
QTc Calculation (Bazett): 583 ms
R Axis: -5 degrees
T Axis: 152 degrees
Ventricular Rate: 116 {beats}/min

## 2023-06-09 LAB — D-DIMER, QUANTITATIVE: D-Dimer, Quant: 3.83 mg{FEU}/L — ABNORMAL HIGH (ref 0.00–0.65)

## 2023-06-09 LAB — BRAIN NATRIURETIC PEPTIDE: NT Pro-BNP: 18017 pg/mL — ABNORMAL HIGH (ref <450)

## 2023-06-09 MED ORDER — TECHNETIUM TO 99M ALBUMIN AGGREGATED
Freq: Once | Status: AC | PRN
Start: 2023-06-09 — End: 2023-06-09
  Administered 2023-06-09: 19:00:00 3.9 via INTRAVENOUS

## 2023-06-09 MED ORDER — AMIODARONE HCL IN DEXTROSE 150-4.21 MG/100ML-% IV SOLN
150-4.21100- | Freq: Once | INTRAVENOUS | Status: AC
Start: 2023-06-09 — End: 2023-06-09
  Administered 2023-06-09: 19:00:00 150 mg via INTRAVENOUS

## 2023-06-09 MED ORDER — AMIODARONE HCL IN DEXTROSE 360-4.14 MG/200ML-% IV SOLN
360-4.14200- | INTRAVENOUS | Status: DC
Start: 2023-06-09 — End: 2023-06-09
  Administered 2023-06-09: 19:00:00 1 mg/min via INTRAVENOUS

## 2023-06-09 MED ORDER — FUROSEMIDE 10 MG/ML IJ SOLN
10 | Freq: Once | INTRAMUSCULAR | Status: AC
Start: 2023-06-09 — End: 2023-06-09
  Administered 2023-06-09: 06:00:00 20 mg via INTRAVENOUS

## 2023-06-09 MED ORDER — AMIODARONE HCL IN DEXTROSE 360-4.14 MG/200ML-% IV SOLN
360-4.14200- | INTRAVENOUS | Status: DC
Start: 2023-06-09 — End: 2023-06-09

## 2023-06-09 MED ORDER — ALBUMIN HUMAN 5 % IV SOLN
5 | Freq: Once | INTRAVENOUS | Status: AC
Start: 2023-06-09 — End: 2023-06-09
  Administered 2023-06-09: 22:00:00 25 g via INTRAVENOUS

## 2023-06-09 MED ORDER — ALBUMIN HUMAN 25 % IV SOLN
25 | Freq: Every day | INTRAVENOUS | Status: DC | PRN
Start: 2023-06-09 — End: 2023-06-09
  Administered 2023-06-09: 21:00:00 25 g via INTRAVENOUS

## 2023-06-09 MED ORDER — FUROSEMIDE 10 MG/ML IJ SOLN
10 MG/ML | Freq: Once | INTRAMUSCULAR | Status: DC
Start: 2023-06-09 — End: 2023-06-09

## 2023-06-09 MED ORDER — FUROSEMIDE 10 MG/ML IJ SOLN
10 | Freq: Once | INTRAMUSCULAR | Status: AC
Start: 2023-06-09 — End: 2023-06-09
  Administered 2023-06-09: 18:00:00 40 mg via INTRAVENOUS

## 2023-06-09 MED FILL — PANTOPRAZOLE SODIUM 40 MG PO TBEC: 40 MG | ORAL | Qty: 1

## 2023-06-09 MED FILL — ACETAMINOPHEN 325 MG PO TABS: 325 MG | ORAL | Qty: 2

## 2023-06-09 MED FILL — ALBUTEIN 5 % IV SOLN: 5 % | INTRAVENOUS | Qty: 500

## 2023-06-09 MED FILL — NEXTERONE 150-4.21 MG/100ML-% IV SOLN: 150-4.21100- MG/100ML-% | INTRAVENOUS | Qty: 100

## 2023-06-09 MED FILL — NEXTERONE 360-4.14 MG/200ML-% IV SOLN: 360-4.14200- MG/200ML-% | INTRAVENOUS | Qty: 200

## 2023-06-09 MED FILL — CEFTRIAXONE SODIUM 2 G IJ SOLR: 2 g | INTRAMUSCULAR | Qty: 2000

## 2023-06-09 MED FILL — FUROSEMIDE 10 MG/ML IJ SOLN: 10 MG/ML | INTRAMUSCULAR | Qty: 4

## 2023-06-09 MED FILL — IRON SUCROSE 20 MG/ML IV SOLN: 20 MG/ML | INTRAVENOUS | Qty: 10

## 2023-06-09 MED FILL — MIRALAX MIX-IN PAX 17 G PO PACK: 17 g | ORAL | Qty: 1

## 2023-06-09 MED FILL — TRAMADOL HCL 50 MG PO TABS: 50 MG | ORAL | Qty: 1

## 2023-06-09 MED FILL — ALBUTEIN 25 % IV SOLN: 25 % | INTRAVENOUS | Qty: 100

## 2023-06-09 MED FILL — SENNA PLUS 8.6-50 MG PO TABS: 8.6-50 MG | ORAL | Qty: 2

## 2023-06-09 MED FILL — ATORVASTATIN CALCIUM 10 MG PO TABS: 10 MG | ORAL | Qty: 1

## 2023-06-09 NOTE — Plan of Care (Signed)
Problem: Safety - Adult  Goal: Free from fall injury  Outcome: Progressing     Problem: Discharge Planning  Goal: Discharge to home or other facility with appropriate resources  Outcome: Progressing  Flowsheets (Taken 06/08/2023 2048)  Discharge to home or other facility with appropriate resources: Identify barriers to discharge with patient and caregiver     Problem: Pain  Goal: Verbalizes/displays adequate comfort level or baseline comfort level  Outcome: Progressing     Problem: Skin/Tissue Integrity  Goal: Skin integrity remains intact  Description: 1.  Monitor for areas of redness and/or skin breakdown  2.  Assess vascular access sites hourly  3.  Every 4-6 hours minimum:  Change oxygen saturation probe site  4.  Every 4-6 hours:  If on nasal continuous positive airway pressure, respiratory therapy assess nares and determine need for appliance change or resting period  Outcome: Progressing

## 2023-06-09 NOTE — Consults (Signed)
 [facility] Consultation    Date of Consult:  06/09/23  Date of Admission: 06/05/2023  Admission type:Emergency    Primary Cardiologist: Dr. 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); NYHA class II on admission  Repeat TTE  Agree with spot diuresis  Keep slightly net negative  Strict Is and Os, daily weights  Troponin elevation  Likely non-MI troponin elevation versus type 2 MI from anemia, CHF, AF with RVR  HS troponin trend is flat  No ischemic evaluation recommended at this juncture  Acute pulmonary edema  Probably related to sepsis which drove AF with RVR and perhaps excessive IVF leading to CHF  Treatment as above  Paroxysmal atrial fibrillation s/p DCCV 10/07/22 with recurrence of atrial fibrillation, now in SR on amiodarone; recurrent AF in setting of sepsis now  Unable to take PO medications currently  Use IV amiodarone; monitor for nausea  Stage IIIb CKD  HTN  HL  Normocytic anemia  E. Coli bacteremia, pyelonephritis  Transaminitis    Transfer to intermediate care, [facility].    Thank you for the opportunity to participate in the care of Mary Woodward and please do not hesitate to contact us should you have any questions.    History of Present Illness:  Mary Woodward is a 89 y.o. female admitted for Nausea and vomiting [R11.2]  Elevated LFTs [R79.89]  AKI (acute kidney injury) (HCC) [N17.9]  Acute cystitis with hematuria [N30.01]  Sepsis without acute organ dysfunction, due to unspecified organism (HCC) [A41.9]  Bacteremia  [R78.81].    From notes:  "89 y.o. female with with pmh of paroxysmal atrial fibrillation, HFpEF, pulmonary edema, hypertension, hyperlipidemia, PE, osteoarthritis, osteoporosis who presented to ed with complaints of abdominal pain, nausea and vomiting. Some cramping abdominal pain and constipation over the last week with nausea and non-bloody, non bilious emesis over the last 2 days.  Also with poor po intake, malaise and fatigue. Suffered a recent fall with a pelvic fracture; treated conservatively. Has had difficulty ambulating as a result.     The patient denies any fever, chills, chest pain, cough, congestion, recent illness, palpitations, or dysuria.  Remarkable vitals on ER Presentation: vss  Labs Remarkable for: wbc 25.6, hgb 8.6, na 128, cr 2.73, t-bili 2.1, alt 909, ast 926, alk phos 216, alb 2.6, UA: grossly infected.  ER Images: ct abd et pelvis: no acute process. Sludge in gallbladder  ER Rx: vanc, cefepime, 2.3l ns bolus, zofran"    RRT called last night due to chest pain and dyspnea.  She reports left sided chest pressure, severe and dyspnea.  Resolved after medications.  No chest discomfort this morning and breathing is more comfortable.    ECG showed AF with RVR, old LBBB.    NT pro-BNP 18017.   Latest Reference Range & Units 06/05/23 21:21 06/08/23 18:10 06/08/23 21:19 06/09/23 00:04   Troponin, High Sensitivity 0 - 51 ng/L 28 210 (HH) 213 (HH) 212 (HH)   (HH): Data is critically high    No past medical history on file.  Prior to Admission medications    Medication Sig Start Date End Date Taking? Authorizing Provider   lovastatin (MEVACOR) 10 MG tablet Take 1 tablet by mouth nightly   Yes [provider]   metoprolol succinate (TOPROL XL) 50 MG extended release tablet Take 1 tablet by mouth daily Hold for SBP<100   Yes [provider]   pantoprazole (PROTONIX) 40 MG tablet Take 1 tablet by mouth in the morning and at bedtime   Yes [provider]   ondansetron (ZOFRAN)  4 MG tablet Take 1 tablet by mouth in the morning and at bedtime   Yes [provider]   ondansetron (ZOFRAN) 4 MG tablet Take 1 tablet by mouth every 8 hours as needed for Nausea or Vomiting   Yes [provider]   polyethylene glycol (MIRALAX) 17 g PACK packet Take 17 g by mouth daily   Yes [provider]   ferrous sulfate (FE TABS 325) 325 (65 Fe) MG EC tablet Take 1 tablet by mouth every other day On even days   Yes [provider]   estradiol (ESTRACE) 0.1 MG/GM vaginal cream Place 0.5 g vaginally once a week On Mondays   Yes [provider]   acetaminophen (TYLENOL) 500 MG tablet Take 1 tablet by mouth in the morning, at noon, and at bedtime   Yes [provider]   acetaminophen (TYLENOL) 500 MG tablet Take 1 tablet by mouth every 6 hours as needed for Pain   Yes [provider]   losartan (COZAAR) 25 MG tablet Take 1 tablet by mouth in the morning and at bedtime Hold for SBP<100   Yes [provider]   amiodarone (CORDARONE) 200 MG tablet Take 1 tablet by mouth 2 times daily for 3 days, THEN 1 tablet daily for 27 days. 1 tablet twice a day (9 am and 9 pm) through 6/12, then 1 tablet daily (9 am) going forward. 10/11/22 11/10/22  Simonne Maffucci, Theophilus Kinds, MD   apixaban Everlene Balls) 2.5 MG TABS tablet Take 1 tablet by mouth 2 times daily 10/11/22   Simonne Maffucci, Theophilus Kinds, MD   empagliflozin (JARDIANCE) 10 MG tablet Take 1 tablet by mouth daily 09/21/22   Ronnie Derby, MD   Multiple Vitamins-Minerals (PRESERVISION AREDS 2) CHEW Take 1 tablet by mouth daily    [provider]       Current Facility-Administered Medications   Medication Dose Route Frequency    furosemide (LASIX) injection 40 mg  40 mg IntraVENous Once    traMADol (ULTRAM) tablet 25 mg  25 mg Oral Q12H PRN    iron sucrose (VENOFER) injection 200 mg  200 mg IntraVENous Q24H    polyethylene glycol (GLYCOLAX) packet 17 g  17 g Oral BID    sennosides-docusate sodium (SENOKOT-S)  8.6-50 MG tablet 2 tablet  2 tablet Oral BID    atorvastatin (LIPITOR) tablet 10 mg  10 mg Oral Daily    pantoprazole (PROTONIX) tablet 40 mg  40 mg Oral QAM AC    sodium chloride flush 0.9 % injection 5-40 mL  5-40 mL IntraVENous 2 times per day    sodium chloride flush 0.9 % injection 5-40 mL  5-40 mL IntraVENous PRN    0.9 % sodium chloride infusion   IntraVENous PRN    ondansetron (ZOFRAN-ODT) disintegrating tablet 4 mg  4 mg Oral Q8H PRN    Or    ondansetron (ZOFRAN) injection 4 mg  4 mg IntraVENous Q6H PRN  polyethylene glycol (GLYCOLAX) packet 17 g  17 g Oral Daily PRN    acetaminophen (TYLENOL) tablet 650 mg  650 mg Oral Q6H PRN    Or    acetaminophen (TYLENOL) suppository 650 mg  650 mg Rectal Q6H PRN    sodium chloride 0.9 % bolus 100 mL  100 mL IntraVENous Once    cefTRIAXone (ROCEPHIN) 2,000 mg in sterile water 20 mL IV syringe  2,000 mg IntraVENous Q24H       No family history on file.    Social History     Socioeconomic History    Marital status: Widowed     Spouse name: Not on file    Number of children: Not on file    Years of education: Not on file    Highest education level: Not on file   Occupational History    Not on file   Tobacco Use    Smoking status: Former     Types: Cigarettes     Passive exposure: Past (stopped about 60 years ago)    Smokeless tobacco: Never   Substance and Sexual Activity    Alcohol use: Yes     Alcohol/week: 2.0 standard drinks of alcohol     Types: 2 Glasses of wine per week    Drug use: Never    Sexual activity: Not on file   Other Topics Concern    Not on file   Social History Narrative    Not on file     Social Determinants of Health     Financial Resource Strain: Not on file   Food Insecurity: No Food Insecurity (10/06/2022)    Hunger Vital Sign     Worried About Running Out of Food in the Last Year: Never true     Ran Out of Food in the Last Year: Never true   Transportation Needs: No Transportation Needs (10/06/2022)    PRAPARE - Product/process development scientist (Medical): No     Lack of Transportation (Non-Medical): No   Physical Activity: Not on file   Stress: Not on file   Social Connections: Not on file   Intimate Partner Violence: Not on file   Housing Stability: Low Risk  (10/06/2022)    Housing Stability Vital Sign     Unable to Pay for Housing in the Last Year: No     Number of Places Lived in the Last Year: 2     Unstable Housing in the Last Year: No       Review of Systems   All other systems reviewed and are negative.        Temp (24hrs), Avg:98 F (36.7 C), Min:97.5 F (36.4 C), Max:98.9 F (37.2 C)    Patient Vitals for the past 8 hrs:   Pulse   06/09/23 0826 (!) 104   06/09/23 0600 (!) 118   06/09/23 0427 (!) 112   06/09/23 0400 98   06/09/23 0238 (!) 106   06/09/23 0154 (!) 107    Patient Vitals for the past 8 hrs:   Resp   06/09/23 0826 18   06/09/23 0541 18   06/09/23 0427 18   06/09/23 0154 18    Patient Vitals for the past 8 hrs:   BP   06/09/23 0826 100/62   06/09/23 0427 119/62   06/09/23 0238 98/65   06/09/23 0154 118/63          Intake/Output Summary (Last 24 hours) at 06/09/2023  2841  Last data filed at 06/09/2023 0541  Gross per 24 hour   Intake 210 ml   Output 750 ml   Net -540 ml       Physical Exam  GEN: NAD, appears stated age  HEENT: EOMI   NECK: Normal JVP  CV: Irregularly irregular, normal S1 and S2, II/VI systolic murmur at LSB  LUNGS: Coarse BS b/l anterolaterally  ABD: Soft, ND  EXT: No edema, 2+ and symmetrical radial pulses b/l  PSYCH: Mood and affect normal  NEURO: Alert, MAEW, face symmetrical, speech intact    Lab Review:  No results for input(s): "PH", "PCO2", "PO2" in the last 72 hours.  No results for input(s): "CPK", "CKMB" in the last 72 hours.    Invalid input(s): "CKNDX", "TROIQ"  Recent Labs     06/07/23  0557 06/08/23  0309 06/09/23  0041 06/09/23  0629   NA 136 136  --  136   K 3.9 4.1  --  4.0   CL 111* 110*  --  108   CO2 15* 17*  --  20*   BUN 39* 34*  --  32*   PHOS  --   --   --  2.3*   WBC 12.7* 9.7 10.6   --    HGB 7.7* 7.3* 7.7*  --    HCT 24.2* 23.1* 24.3*  --    PLT 372 379 416*  --      Recent Labs     06/07/23  0557 06/08/23  0309   GLOB 3.5 2.4     No results for input(s): "INR", "APTT" in the last 72 hours.    Invalid input(s): "PTP"   No results for input(s): "TIBC" in the last 72 hours.    Invalid input(s): "FE", "PSAT", "FERR"   No results found for: "GLUCPOC"    No results found for: "CHOL", "CHOLPOCT", "CHLST", "CHOLV", "HDL", "HDLPOC", "HDLC", "LDL", "LDLC", "VLDLC", "VLDL"     Recent Results (from the past 24 hour(s))   POCT Glucose    Collection Time: 06/08/23  5:55 PM   Result Value Ref Range    POC Glucose 208 (H) 65 - 117 mg/dL    Performed by: Marin Shutter    Troponin    Collection Time: 06/08/23  6:10 PM   Result Value Ref Range    Troponin, High Sensitivity 210 (HH) 0 - 51 ng/L   EKG 12 Lead    Collection Time: 06/08/23  7:12 PM   Result Value Ref Range    Ventricular Rate 116 BPM    Atrial Rate 69 BPM    QRS Duration 172 ms    Q-T Interval 420 ms    QTc Calculation (Bazett) 583 ms    R Axis -5 degrees    T Axis 152 degrees    Diagnosis       Atrial fibrillation with rapid ventricular response  Left bundle branch block  When compared with ECG of 05-Jun-2023 21:13,  Atrial fibrillation has replaced Sinus rhythm  Vent. rate has increased BY  53 BPM  Confirmed by Arnoldo Lenis, M.D., [place] ([id number]) on 06/09/2023 8:53:59 AM     Extra Tubes Hold    Collection Time: 06/08/23  9:02 PM   Result Value Ref Range    Specimen HOld 1SST     Comment:        Add-on orders for these samples will be processed based on acceptable specimen integrity and analyte stability, which may vary by  analyte.   Troponin    Collection Time: 06/08/23  9:19 PM   Result Value Ref Range    Troponin, High Sensitivity 213 (HH) 0 - 51 ng/L   Troponin    Collection Time: 06/09/23 12:04 AM   Result Value Ref Range    Troponin, High Sensitivity 212 (HH) 0 - 51 ng/L   D-Dimer, Quantitative    Collection Time: 06/09/23 12:04 AM   Result Value  Ref Range    D-Dimer, Quant 3.83 (H) 0.00 - 0.65 mg/L FEU   Brain Natriuretic Peptide    Collection Time: 06/09/23 12:04 AM   Result Value Ref Range    NT Pro-BNP 18,017 (H) <450 PG/ML   CBC with Auto Differential    Collection Time: 06/09/23 12:41 AM   Result Value Ref Range    WBC 10.6 3.6 - 11.0 K/uL    RBC 2.94 (L) 3.80 - 5.20 M/uL    Hemoglobin 7.7 (L) 11.5 - 16.0 g/dL    Hematocrit 01.0 (L) 35.0 - 47.0 %    MCV 82.7 80.0 - 99.0 FL    MCH 26.2 26.0 - 34.0 PG    MCHC 31.7 30.0 - 36.5 g/dL    RDW 27.2 (H) 53.6 - 14.5 %    Platelets 416 (H) 150 - 400 K/uL    MPV 10.6 8.9 - 12.9 FL    Nucleated RBCs 0.3 (H) 0 PER 100 WBC    nRBC 0.03 (H) 0.00 - 0.01 K/uL    Neutrophils % 74.9 32.0 - 75.0 %    Lymphocytes % 10.0 (L) 12.0 - 49.0 %    Monocytes % 12.5 5.0 - 13.0 %    Eosinophils % 0.8 0.0 - 7.0 %    Basophils % 0.4 0.0 - 1.0 %    Immature Granulocytes % 1.4 (H) 0.0 - 0.5 %    Neutrophils Absolute 7.94 1.80 - 8.00 K/UL    Lymphocytes Absolute 1.06 0.80 - 3.50 K/UL    Monocytes Absolute 1.33 (H) 0.00 - 1.00 K/UL    Eosinophils Absolute 0.08 0.00 - 0.40 K/UL    Basophils Absolute 0.04 0.00 - 0.10 K/UL    Immature Granulocytes Absolute 0.15 (H) 0.00 - 0.04 K/UL    Differential Type SMEAR SCANNED      Platelet Comment CLUMPED PLATELETS      RBC Comment ANISOCYTOSIS  2+        RBC Comment MICROCYTOSIS  1+        RBC Comment SCHISTOCYTES  1+       Renal Function Panel    Collection Time: 06/09/23  6:29 AM   Result Value Ref Range    Sodium 136 136 - 145 mmol/L    Potassium 4.0 3.5 - 5.1 mmol/L    Chloride 108 97 - 108 mmol/L    CO2 20 (L) 21 - 32 mmol/L    Anion Gap 8 2 - 12 mmol/L    Glucose 87 65 - 100 mg/dL    BUN 32 (H) 6 - 20 MG/DL    Creatinine 6.44 (H) 0.55 - 1.02 MG/DL    BUN/Creatinine Ratio 23 (H) 12 - 20      Est, Glom Filt Rate 35 (L) >60 ml/min/1.80m2    Calcium 8.0 (L) 8.5 - 10.1 MG/DL    Phosphorus 2.3 (L) 2.6 - 4.7 MG/DL    Albumin 2.2 (L) 3.5 - 5.0 g/dL   Extra Tubes Hold    Collection Time: 06/09/23  6:29  AM  Result Value Ref Range    Specimen HOld 1LAV     Comment:        Add-on orders for these samples will be processed based on acceptable specimen integrity and analyte stability, which may vary by analyte.       Data Review:  ECG tracing personally reviewed:   Encounter Date: 06/05/23   EKG 12 Lead   Result Value    Ventricular Rate 116    Atrial Rate 69    QRS Duration 172    Q-T Interval 420    QTc Calculation (Bazett) 583    R Axis -5    T Axis 152    Diagnosis      Atrial fibrillation with rapid ventricular response  Left bundle branch block  When compared with ECG of 05-Jun-2023 21:13,  Atrial fibrillation has replaced Sinus rhythm  Vent. rate has increased BY  53 BPM  Confirmed by Arnoldo Lenis, M.D., [place] ([id number]) on 06/09/2023 8:53:59 AM         Echocardiogram:  10/05/22    TEE W/ POSSIBLE CARDIOVERSION (PRN CONTRAST/BUBBLE/3D) 10/07/2022 10:26 AM (Final)    Interpretation Summary    Left Ventricle: Severely reduced left ventricular systolic function with a visually estimated EF of 20 - 25%. Left ventricle size is normal. Normal wall thickness. Severe global hypokinesis present.    Aortic Valve: Trileaflet valve.    Mitral Valve: Mild regurgitation.    Tricuspid Valve: Normal RVSP. The estimated RVSP is 30 mmHg.    Left Atrium: Left atrium is severely dilated. Decreased appendage flow velocity. No left atrial appendage thrombus noted.    Right Atrium: Right atrium is mildly dilated.    Image quality is good.    Signed by: Einar Grad, MD on 10/07/2022 10:26 AM    TTE 09/19/22:    Left Ventricle: Reduced left ventricular systolic function with a visually estimated EF of 50 - 55%. Left ventricle size is normal. Increased wall thickness. Diastolic dysfunction present with normal LV EF.    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    Left Atrium: Left atrium is dilated.    Image quality is adequate.    Other imaging:   CXR:  IMPRESSION:     New mild CHF pattern of pulmonary edema since 3 days  ago.  Personally reviewed and agree this shows new pulmonary edema.Kermit Balo Dory Peru, MD  Structural Interventional Cardiology  [facility]  06/09/23

## 2023-06-09 NOTE — Progress Notes (Signed)
Infectious Diseases Follow Up    06/09/2023      Assessment & Plan:     89 y/o female with sepsis with E coli bacteremia and transaminitis.    E.coli bacteremia  Transaminitis  Leukocytosis - resolved  Urine cx GNR  Repeat blood cx 2/4 NGTD  Suspected urinary source and pyelonephritis.     Continue ceftriaxone while inpatient. Can transition to PO Keflex 500 mg BID through 06/16/23, inclusive, when stable for discharge.      Trend liver enzymes and bilirubin - improving       D/w Dr. Dagoberto Ligas, pt        Anti-infectives:   ceftriaxone    Subjective:   RRT called yesterday for chest pain. Daughter reports that she has nausea and vomiting with significant constipation in the past and voiced concern regarding oral abx regimen. Per daughter has had multiple bowel movements since admission.     Objective:     Vitals: BP 100/62   Pulse (!) 104   Temp 98.2 F (36.8 C) (Oral)   Resp 18   Ht 1.524 m (5')   Wt 63.5 kg (140 lb)   SpO2 97%   BMI 27.34 kg/m      Tmax:  Temp (24hrs), Avg:98 F (36.7 C), Min:97.5 F (36.4 C), Max:98.9 F (37.2 C)      Exam:   Patient is intubated:  no    Exam:        GEN: NAD  HEENT: Normocephalic, atraumatic  CV: normal rate  Lungs: Nl effort, 1 LPM  Abdomen: soft, non distended, non tender  Genitourinary: no foley  Extremities: no edema  Neuro: Alert, oriented to time, place and situation, moves all extremities to commands, verbal   Skin: no rash  Psych: good affect, pleasant       Labs:        Invalid input(s): "ITNL"   No results for input(s): "CPK", "CKMB" in the last 72 hours.    Invalid input(s): "TROIQ"  Recent Labs     06/07/23  0557 06/08/23  0309 06/09/23  0041 06/09/23  0629   NA 136 136  --  136   K 3.9 4.1  --  4.0   CL 111* 110*  --  108   CO2 15* 17*  --  20*   BUN 39* 34*  --  32*   PHOS  --   --   --  2.3*   WBC 12.7* 9.7 10.6  --    HGB 7.7* 7.3* 7.7*  --    HCT 24.2* 23.1* 24.3*  --    PLT 372 379 416*  --      No results for input(s): "INR", "APTT" in the last 72  hours.    Invalid input(s): "PTP"          Cultures:   @MICRORESULTS @      Radiology:     Medications:          Current Facility-Administered Medications:     furosemide (LASIX) injection 40 mg, 40 mg, IntraVENous, Once, Hartle, Shirlean Mylar, MD    traMADol Janean Sark) tablet 25 mg, 25 mg, Oral, Q12H PRN, Jory Sims, APRN - NP, 25 mg at 06/09/23 0541    iron sucrose (VENOFER) injection 200 mg, 200 mg, IntraVENous, Q24H, Hill, Sharion Dove, APRN - NP, 200 mg at 06/08/23 0956    polyethylene glycol (GLYCOLAX) packet 17 g, 17 g, Oral, BID, Ngwafang, Bleck B, MD, 17 g at  06/07/23 0816    sennosides-docusate sodium (SENOKOT-S) 8.6-50 MG tablet 2 tablet, 2 tablet, Oral, BID, Ngwafang, Bleck B, MD, 2 tablet at 06/07/23 0816    atorvastatin (LIPITOR) tablet 10 mg, 10 mg, Oral, Daily, Ngwafang, Bleck B, MD, 10 mg at 06/08/23 0825    pantoprazole (PROTONIX) tablet 40 mg, 40 mg, Oral, QAM AC, Ngwafang, Bleck B, MD, 40 mg at 06/09/23 0539    sodium chloride flush 0.9 % injection 5-40 mL, 5-40 mL, IntraVENous, 2 times per day, Ngwafang, Bleck B, MD, 10 mL at 06/08/23 2232    sodium chloride flush 0.9 % injection 5-40 mL, 5-40 mL, IntraVENous, PRN, Ngwafang, Bleck B, MD, 10 mL at 06/08/23 0955    0.9 % sodium chloride infusion, , IntraVENous, PRN, Ngwafang, Bleck B, MD    ondansetron (ZOFRAN-ODT) disintegrating tablet 4 mg, 4 mg, Oral, Q8H PRN **OR** ondansetron (ZOFRAN) injection 4 mg, 4 mg, IntraVENous, Q6H PRN, Ngwafang, Bleck B, MD, 4 mg at 06/07/23 2155    polyethylene glycol (GLYCOLAX) packet 17 g, 17 g, Oral, Daily PRN, Ngwafang, Bleck B, MD    acetaminophen (TYLENOL) tablet 650 mg, 650 mg, Oral, Q6H PRN, 650 mg at 06/09/23 0538 **OR** acetaminophen (TYLENOL) suppository 650 mg, 650 mg, Rectal, Q6H PRN, Ngwafang, Bleck B, MD    sodium chloride 0.9 % bolus 100 mL, 100 mL, IntraVENous, Once, Ngwafang, Bleck B, MD    cefTRIAXone (ROCEPHIN) 2,000 mg in sterile water 20 mL IV syringe, 2,000 mg, IntraVENous, Q24H, Quincy Simmonds, MD, 2,000 mg at 06/08/23 1610    Thank you for the opportunity to participate in the care of this patient.      A total time of 35 minutes was spent on today's encounter.  Greater than 50% of the time was spent on the following:  Preparing for visit and chart review.  Obtaining and/or reviewing separately obtained history  Performing a medically appropriate exam and/or evaluation  Counseling and educating a patient/family/caregiver as noted above  Placing relevant orders  Referring and communicating with other professionals (not separately reported)  Independently interpreting results (not separately reported) and communicating results to the patient/family/caregiver  Care coordination (not separately reported) as noted above  Documenting clinical information in the electronic health records (e.g. problem list, visit note) on the day of the encounter     Lance Muss, APRN - NP

## 2023-06-09 NOTE — Consults (Signed)
Palliative Medicine  Patient Name: Mary Woodward  Date of Birth: 1927-10-20  MRN: [id number]  Age: 89 y.o.  Gender: female    Date of Initial Consult: 06/09/2023  Date of Service: 06/09/2023  Time: 12:08 PM  Provider: Gray Bernhardt, APRN - CNP  Hospital Day: 5  Admit Date: 06/05/2023  Referring Provider: Ezequiel Kayser, MD      Reasons for Consultation:  Goals of Care    HISTORY OF PRESENT ILLNESS (HPI):   Mary Woodward is a 89 y.o. female with a past medical history of A-fib, HFpEF, pulmonary edema, HTN, HLD, who was admitted on 06/05/2023 from ALF with complaints of nausea, vomiting, and abdominal pain.     Psychosocial: Patient widowed and have three adult children Mary Woodward, Mary Woodward and Mary Woodward). 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      PALLIATIVE DIAGNOSES:    Shortness of Breath  Fatigue  UTI  Generalized weakness  Physical debility  Goals of care  Palliative care encounter    ASSESSMENT AND PLAN:   Palliative team has been asked to meet with Mary Woodward to address goals of care.  Reviewed medical chart including admit H&P, consultant notes, MAR, and recent labs/imaging.  Mary Woodward was seen and evaluated, daughter, Mary Woodward at bedside. Introduced role of palliative.   At time of my arrival, she was resting in bed in no acute distress.    On exam the patient is drowsy, but arousable to voice and when awake is alert and oriented x 3.  Pt states that she feels tired, short of breath, but denies any pain or acute complaints.  Patient noted that she is trying to have a bowel movement - constipation is a concern for her.    Understanding of Illness/Discussion: We discussed the patient's condition in detail.   Daughter, Mary Woodward verbalized a clear understanding of hospital events including issues leading to admission.   Per Mary Woodward, patient was at rehab to improve strength, but during her visit she noted a decline - nausea, fatigue, and patient not eating/drinking x 2 days.  Patient  being treated with IV antibiotics for UTI (E-coli).  Family wants patient to be treated, and once medically stable return to ALF.    Constipation:   1.  Continue Miralax daily    2.  Continue Senna (2 tabs) twice daily.    Decisions/Goals: continue to address acute issues.   Daughter would like the pt to return home (ALF) when medically stable.  2.  Noting want to give patient all chance to recover from current illness.  3.   Cardiology consult pending.     Code Status: DNR.  Daughter confirmed code status and wanted to ensure DNR is on file.    ACP: Daughter stated that patient has an AMD that was prepared with the assistance of an attorney. It names all three of her children, Mary Woodward, and Mary Woodward.  Per Mary Woodward her siblings look to her as she lives locally in [place], but she will never make any medical decision without them or their input.  Mary Woodward provided [place] with a copy to scan in the EMR.    Plan: continue current care    Initial consult note routed to primary continuity provider and/or primary health care team members  Discussed management of Mary Woodward with hospitalist.  Thanks for consulting palliative medicine in the care of Mary Woodward.  Please call with any palliative questions or concerns.  Palliative Care Team  is available via perfect serve or via phone.    Referrals to:   []  Outpatient Palliative Care  []  Home Based Palliative Care  []  Home Based Primary Care  []  Hospice       ADVANCE CARE PLANNING:   [x]  The Pall Med Interdisciplinary Team has updated the ACP Navigator with Health Care Decision Maker and Patient Capacity    Current Code Status: DNR     Goals of Care: Goals of Care and Interventions  Patient/Health Care Proxy Stated Goals: Recovery from acute illness  Medical Interventions: Full interventions  Artificially Administered Nutrition: No feeding tube  Life Goals  Patient and Family Personal Goals: Improve and return to ALF    Please refer to Palliative Medicine ACP notes for further  details.    PALLIATIVE ASSESSMENT:      Palliative Performance Scale (PPS):  PPS: 40    ECOG:   ECOG Status : Limited self-care [3]    Modified ESAS:  Modified-[place] Symptom Assessment Scale (ESAS)  Tiredness Score: 4  Drowsiness Score: 3  Depression Score: Not depressed  Pain Score: No pain  Anxiety Score: Not anxious  Nausea Score: Not nauseated  Appetite Score: 2  Dyspnea Score: 3  Wellbeing Score: 3  Other Problem Score: Best possible response    Clinical Pain Assessment (nonverbal scale for severity on nonverbal patients):   Clinical Pain Assessment  Severity: 0       NVPS:  Adult Nonverbal Pain Scale (NVPS)  Physiology (Vital Signs): Stable vital signs  Respiratory: Baseline RR/SpO2 compliant with ventilator    RDOS:  RDOS  Heart rate per minute: 90-109  Respiratory Rate per Minute: less than 19  Restlessness:non-purposeful: None  Paradoxical breathing pattern:abdomen moves in on inspiration: None  Accessory muscle use: rise in clavicle during inspiration: None  Grunting at end-expiration: guttural sound: None  Nasal flaring: involuntary movement of nares: None  Look of fear: None  Total : 1      Vital Signs: Blood pressure 100/62, pulse 96, temperature 98.2 F (36.8 C), temperature source Oral, resp. rate 18, height 1.524 m (5'), weight 63.5 kg (140 lb), SpO2 97%.    PHYSICAL ASSESSMENT:   General: [x]  Oriented x3  []  Well appearing  []  Intubated  [] Ill appearing  [] Other:  Mental Status: []  Normal mental status exam  [x]  Drowsy  []  Confused  [] Other:  Cardiovascular: [x]  Regular rate/rhythm  []  Arrhythmia  []  Other:  Chest: [x]  Effort normal  [] Lungs clear  []  Respiratory distress  [] Tachypnea  [x]  Other: Oxygen - 2 L via NC  Abdomen: [x]  Soft/non-tender  []  Normal appearance  []  Distended  []  Ascites  []  Other:  Neurological: [x]  Normal speech  []  Normal sensation  [] Deficits present:  Extremity: [x]  Normal skin color/temp  []  Clubbing/cyanosis  []  No edema  []  Other:    Wt Readings from Last 15  Encounters:   06/09/23 63.5 kg (140 lb)   10/11/22 57.7 kg (127 lb 3.3 oz)   09/20/22 62 kg (136 lb 11.2 oz)        Current Diet: ADULT DIET; Easy to Chew       PSYCHOSOCIAL/SPIRITUAL SCREENING:   Palliative IDT has assessed this patient for cultural preferences / practices and a referral made as appropriate to needs Gaffer, Patient Advocacy, Ethics, etc.)    Spiritual Affiliation: Unknown    Any spiritual / religious concerns:  []  Yes /  [x]  No   If "Yes" to discuss with pastoral care  during IDT     Does caregiver feel burdened by caring for their loved one:   []  Yes /  [x]  No /  []  No Caregiver Present/Available []  No Caregiver []  Pt Lives at Facility  If "Yes" to discuss with social work during IDT    Anticipatory grief assessment:   [x]  Normal  / []  Maladaptive     If "Maladaptive" to discuss with social work during IDT    ESAS Anxiety: Anxiety Score: Not anxious    ESAS Depression: Depression Score: Not depressed        LAB AND IMAGING FINDINGS:   Objective data reviewed:  labs, images, records, medication use, vitals, and chart     FINAL COMMENTS   Thank you for allowing Palliative Medicine to participate in the care of Yumi Insalaco.    Only check if applicable and billing time based rather than MDM  [x]  The total encounter time on this service date was 75 minutes which was spent performing a face-to-face encounter and personally completing the provider-level activities documented in the note. This includes time spent prior to the visit and after the visit in direct care of the patient. This time does not include time spent in any separately reportable services.    Electronically signed by   Gray Bernhardt, APRN - CNP  Palliative Care Team  on 06/09/2023 at 12:08 PM

## 2023-06-09 NOTE — Progress Notes (Signed)
[facility]   [address] [place], [place] [address]  Phone: [phone]   [phone]    [email or web address]     Nephrology Progress Note    Patient Name : Mary Woodward     DOB : Sep 30, 1927     MRN : [id number]  Date of Admission : 06/05/2023  Date of Servive : 06/09/23    CC:  Follow up for AKI       Assessment and Plan   AKI:  - from IVVD in the setting of N/V and pyelo  - Cr stable  - hold any further IVF  - lasix 40mg  IV x 1 again this AM  - monitor UOP  - daily labs     CKD 3b:  - baseline Cr 1.4 to 1.6  - not f/b a nephrologist     NGMA:  - 2/2 renal failure  - improving  - hold bcb drip    Fluid overload:  - lasix 40mg  IV x 1 now     Transaminitis  Cholestasis     Pyelonephritis:  - GNR in urine and blood    E. Coli bacteremia    HFrEF:  - EF 20-25% 10/07/22     Interval History:  Seen and examined.  RR called overnight, fluid overloaded on CXR.  Received IV lasix early this AM.  Feeling better this AM.  Daughter at bedside    Review of Systems: Pertinent items are noted in HPI.    Current Medications:   Current Facility-Administered Medications   Medication Dose Route Frequency    furosemide (LASIX) injection 40 mg  40 mg IntraVENous Once    traMADol (ULTRAM) tablet 25 mg  25 mg Oral Q12H PRN    iron sucrose (VENOFER) injection 200 mg  200 mg IntraVENous Q24H    polyethylene glycol (GLYCOLAX) packet 17 g  17 g Oral BID    sennosides-docusate sodium (SENOKOT-S) 8.6-50 MG tablet 2 tablet  2 tablet Oral BID    atorvastatin (LIPITOR) tablet 10 mg  10 mg Oral Daily    pantoprazole (PROTONIX) tablet 40 mg  40 mg Oral QAM AC    sodium chloride flush 0.9 % injection 5-40 mL  5-40 mL IntraVENous 2 times per day    sodium chloride flush 0.9 % injection 5-40 mL  5-40 mL IntraVENous PRN    0.9 % sodium chloride infusion   IntraVENous PRN    ondansetron (ZOFRAN-ODT) disintegrating tablet 4 mg  4 mg Oral Q8H PRN    Or    ondansetron (ZOFRAN) injection 4 mg  4 mg IntraVENous  Q6H PRN    polyethylene glycol (GLYCOLAX) packet 17 g  17 g Oral Daily PRN    acetaminophen (TYLENOL) tablet 650 mg  650 mg Oral Q6H PRN    Or    acetaminophen (TYLENOL) suppository 650 mg  650 mg Rectal Q6H PRN    sodium chloride 0.9 % bolus 100 mL  100 mL IntraVENous Once    cefTRIAXone (ROCEPHIN) 2,000 mg in sterile water 20 mL IV syringe  2,000 mg IntraVENous Q24H      No Known Allergies    Objective:  Vitals:    Vitals:    06/09/23 0427 06/09/23 0541 06/09/23 0600 06/09/23 0826   BP: 119/62   100/62   Pulse: (!) 112  (!) 118 (!) 104   Resp: 18 18  18    Temp: 97.7 F (36.5 C)   98.2  F (36.8 C)   TempSrc: Oral   Oral   SpO2: 97%   97%   Weight:  63.5 kg (140 lb)     Height:         Intake and Output:  No intake/output data recorded.  02/04 1901 - 02/06 0700  In: 340 [P.O.:330; I.V.:10]  Out: 1100 [Urine:1100]    Physical Examination:  General: NAD,Conversant   Neck:  Supple, no mass  Resp:  Bibasilar crackles, on O2  CV:  RRR,  no murmur or rub, trace LE edema  GI:  Soft, NT, + BS, no HS megaly  Neurologic:  Non focal  Psych:             AAO x 3 appropriate affect   Skin:  No Rash     []     High complexity decision making was performed  []     Patient is at high-risk of decompensation with multiple organ involvement    Lab Data Personally Reviewed: I have reviewed all the pertinent labs, microbiology data and radiology studies during assessment.    Labs:  Recent Labs     06/07/23  0557 06/08/23  0309 06/09/23  0629   NA 136 136 136   K 3.9 4.1 4.0   CL 111* 110* 108   CO2 15* 17* 20*   GLUCOSE 86 94 87   BUN 39* 34* 32*   CREATININE 1.58* 1.42* 1.39*   CALCIUM 8.0* 7.8* 8.0*       Recent Labs     06/07/23  0557 06/08/23  0309 06/09/23  0041   WBC 12.7* 9.7 10.6   RBC 2.87* 2.81* 2.94*   HGB 7.7* 7.3* 7.7*   HCT 24.2* 23.1* 24.3*   MCV 84.3 82.2 82.7   MCH 26.8 26.0 26.2   MCHC 31.8 31.6 31.7   RDW 22.0* 21.9* 22.1*   PLT 372 379 416*   MPV 10.8 10.5 10.6     Recent Labs     06/07/23  0557 06/08/23  0309   GLOB  3.5 2.4     No results for input(s): "INR", "APTT" in the last 72 hours.    Invalid input(s): "PTP"   No results for input(s): "CPK", "CKMB", "TROPONINI" in the last 72 hours.    Invalid input(s): "B-NP"  Invalid input(s): "PHI", "PCO2I", "PO2I", "FIO2I"     Ventilator:       Microbiology:  No results found for: "SDES"  No components found for: "CULT"      I have reviewed the flowsheets.  Chart and Pertinent Notes have been reviewed.   No change in PMH ,family and social history from Consult note.      Sallyanne Havers, MD  [facility]

## 2023-06-09 NOTE — Progress Notes (Addendum)
2000hrs. Received in bed A&Ox4. On 2L of O2, maintained. Granddaughter present at bedside. Denied any chest discomfort & nil SOB noted & reported.     2100hrs: Repeat troponin sent to lab. Informed night provider of the result.     Updated granddaughter via phone call.     2349hrs: Answered pt's call, c/o L dull-chest pain, w/ 5/10 pain score, non radiating. HR irreg 100s-120s. nil SOB noted&reported. Night provider informed. All new orders completed.    0150hrs: Transferred pt to 2N, report given to [facility] RN. Informed 2N RN to please contact daughter/granddaughter [id number] around 0630AM onwards.     VS strictly monitored.

## 2023-06-09 NOTE — Progress Notes (Signed)
Hospitalist Night Cover     Name: Mary Woodward  Date of birth: 1928/04/20      Overnight update:        Mary Woodward is a 89yo with a pmhx of paf, HFpEF, pulmonary edema, HTN, HLD, PE, osteoarthritis, and osteoporosis who is admitted to room 564 with acute cystitis/ pyelonephritis.     Rapid response called during previous shift. EKG and xray done   Xray showing pulmonary edema. Discussed with Dr. Lum Babe. Recommends to get BNP prior to lasix as blood pressures have remained low.     2355  Notified by RN that patient having chest pain again. Seen and examined patient at bedside. States that she is having ongoing left sided chest pain which may be a worse than what it was earlier.   Per chart review- she has hx of chf with EF 20-25% on TEE (10/07/22). Previously on lasix po, has not been during this admission. History of PE.     Chest pain   Pulmonary edema   - Hx of CHF   - EKG(s) completed   - BNP 18,017  - D-dimer 3.83- Consider Vq scan in the am. Unable to do to CT with contrast d/t kidney function.   - Lasix 20 mg IV now, likely will need second dose if BP tolerates.   - Transfer to tele   - Consult cardiology in the am     Discussed with Dr. Lum Babe.   Patient at high risk for decompensation.      Ivory Broad AGAC-NP

## 2023-06-09 NOTE — ACP (Advance Care Planning) (Addendum)
Advance Care Planning     Advance Care Planning (ACP) Physician/NP/PA Conversation    Date of Conversation: 06/05/2023  Conducted with: Healthcare Decision Maker  Other persons present: Daughter Kellie Simmering  Son    Granddaughter Zoe Lan (on speaker phone) and additional granddaughter at bedside.     Healthcare Decision Maker:   Primary Decision Maker: Kellie Simmering - Child - [phone]    Primary Decision Maker: Dolphus Jenny - Child    Primary Decision Maker: Corena Pilgrim - Child     Today we discussed goals of care.     Care Preferences:    Hospitalization:  "If your health worsens and it becomes clear that your chance of recovery is unlikely, what would be your preference regarding hospitalization?"  The patient would prefer comfort-focused treatment without hospitalization.    Ventilation:  "If you were unable to breath on your own and your chance of recovery was unlikely, what would be your preference about the use of a ventilator (breathing machine) if it was available to you?"  The patient would NOT desire the use of a ventilator.    Resuscitation:  "In the event your heart stopped as a result of an underlying serious health condition, would you want attempts made to restart your heart, or would you prefer a natural death?"  No, do NOT attempt to resuscitate.    treatment goals and end of life care.   Notified by RN that multiple family members at bedside would like to discuss patients treatment goals.   Spoke with daughter, son, granddaughter at bedside and one granddaughter on speaker phone. Family states that patient has remained in pain and they would like to focus on only making her comfortable.   We discussed comfort care measure- including discontinuing current treatment and only focusing on end of life symptomatic treatment. All family members are in agreement to transition patient to comfort care measures only and discontinuation of current treatment including antibiotics.   Orders placed.  Dicussed with bedside RN    Conversation Outcomes / Follow-Up Plan:  ACP complete - no further action today  Reviewed DNR/DNI and patient confirms current DNR status - completed forms on file (place new order if needed)    Length of Voluntary ACP Conversation in minutes:  25 minutes    Raynelle Highland, APRN - NP

## 2023-06-09 NOTE — Progress Notes (Signed)
Spiritual Health History and Assessment/Progress Note  [facility]    Palliative Care,  , Adjustment to illness,      Name: Mary Woodward MRN: [id number]    Age: 89 y.o.     Sex: female   Language: English   Religion: Unknown   Nausea and vomiting     Date: 06/09/2023            Total Time Calculated: 20 min              Spiritual Assessment began in [facility] CV SERVICES UNIT        Referral/Consult From: Palliative Care   Encounter Overview/Reason: Palliative Care  Service Provided For: Patient    Faith, Belief, Meaning:   Patient identifies as spiritual  Family/Friends No family/friends present      Importance and Influence:  Patient has no beliefs influential to healthcare decision-making identified during this visit  Family/Friends No family/friends present    Community:  Patient feels well-supported. Support system includes: Children and Extended family  Family/Friends No family/friends present    Assessment and Plan of Care:     Patient Interventions include: Facilitated expression of thoughts and feelings and Affirmed coping skills/support systems  Family/Friends Interventions include: No family/friends present    Patient Plan of Care: Spiritual Care available upon further referral  Family/Friends Plan of Care: Spiritual Care available upon further referral    I visited Mary Woodward for a palliative initil spiritual health assessment.     Psychosocial: Patient widowed and have three adult children Mary Woodward, Mary Woodward and Mary Woodward). 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     She spoke of her illness and hopes of being able to return to her home. Her daughter, Mary Woodward arrived at the end of the visit.     Electronically signed by Lucy Chris, BCC on 06/09/2023 at 1:35 PM

## 2023-06-09 NOTE — Care Coordination-Inpatient (Signed)
Transition of Care Plan:     RUR: 20%  Prior Level of Functioning: Resides at [name].  Disposition: [facility]  EDD: 2 days  If SNF or IPR: Date FOC offered: 2/3  Date FOC received: 2/3  Accepting facility: [facility]  Date authorization started with reference number: NA  Date authorization received and expires:   Follow up appointments: PCP/Specialist  DME needed: Defer to rehab facility  Transportation at discharge: TBD  IM/IMM Medicare/Tricare letter given: 2/3  Is patient a Veteran and connected with VA?               If yes, was [employer] transfer form completed and VA notified?   Caregiver Contact: Kellie Simmering [phone]   Discharge Caregiver contacted prior to discharge?   Care Conference needed? No  Barriers to discharge:  Medical stability    Rec'd a call from [employer], [place] [phone] and she wanted to follow up on the d/c date.  CM reviewed notes from today and will follow up w [place] after IDRs tomorrow.    Angelica Ran, MSW CCM  Care Management   Available on [employer] or [phone]

## 2023-06-09 NOTE — Progress Notes (Signed)
Occupational Therapy  06/09/23    Chart review completed in preparation for OT tx. Pt currently off floor for VQ scan to rule out PE. Will defer and follow up as able and appropriate.     Thank you  Levi Aland, OTR/L

## 2023-06-09 NOTE — Plan of Care (Signed)
Problem: Safety - Adult  Goal: Free from fall injury  Outcome: Progressing  Flowsheets (Taken 06/09/2023 0944)  Free From Fall Injury: Instruct family/caregiver on patient safety     Problem: Discharge Planning  Goal: Discharge to home or other facility with appropriate resources  Outcome: Progressing  Flowsheets (Taken 06/09/2023 0944)  Discharge to home or other facility with appropriate resources: Identify barriers to discharge with patient and caregiver     Problem: Pain  Goal: Verbalizes/displays adequate comfort level or baseline comfort level  Outcome: Progressing     Problem: Skin/Tissue Integrity  Goal: Skin integrity remains intact  Description: 1.  Monitor for areas of redness and/or skin breakdown  2.  Assess vascular access sites hourly  3.  Every 4-6 hours minimum:  Change oxygen saturation probe site  4.  Every 4-6 hours:  If on nasal continuous positive airway pressure, respiratory therapy assess nares and determine need for appliance change or resting period  Outcome: Progressing  Flowsheets (Taken 06/09/2023 0944)  Skin Integrity Remains Intact: Monitor for areas of redness and/or skin breakdown

## 2023-06-09 NOTE — Progress Notes (Signed)
PT Contact Note    Chart reviewed in preparation for PT tx session. Pt currently off floor for VQ scan to rule out PE. Will defer and follow up as able and appropriate.     Thank you,  Joycelyn Das, PT, DPT

## 2023-06-09 NOTE — Progress Notes (Signed)
Hospitalist Progress Note  Ezequiel Kayser, MD  Answering service: [phone] OR [phone] from in house phone        Date of Service:  06/09/2023  NAME:  Mary Woodward  DOB:  Jul 31, 1927  MRN:  [id number]      Admission Summary:   Per H&P 2/2   Cleora Karnik is a 89 y.o. female with with pmh of paroxysmal atrial fibrillation, HFpEF, pulmonary edema, hypertension, hyperlipidemia, PE, osteoarthritis, osteoporosis who presented to ed with complaints of abdominal pain, nausea and vomiting. Some cramping abdominal pain and constipation over the last week with nausea and non-bloody, non bilious emesis over the last 2 days.  Also with poor po intake, malaise and fatigue. Suffered a recent fall with a pelvic fracture; treated conservatively. Has had difficulty ambulating as a result.     The patient denies any fever, chills, chest pain, cough, congestion, recent illness, palpitations, or dysuria.  Remarkable vitals on ER Presentation: vss  Labs Remarkable for: wbc 25.6, hgb 8.6, na 128, cr 2.73, t-bili 2.1, alt 909, ast 926, alk phos 216, alb 2.6, UA: grossly infected.  ER Images: ct abd et pelvis: no acute process. Sludge in gallbladder  ER Rx: vanc, cefepime, 2.3l ns bolus, zofran    Interval history / Subjective:   Patient seen examined at bedside earlier, daughter was also present at bedside and palliative care was speaking to her.  Patient is weak     Assessment & Plan:     Acute on chronic HFmef  -Overnight events noted with recurrent chest pain noted to be in A-fib RVR, repeat TTE pending status post IV diuresis defer to cardiology for further continuation of diuretics  - I's and O's Daily weights  - A-fib RVR as discussed below     Acute Cystitis/pyelonephritis  E. coli bacteremia secondary to above  Pyelonephritis as seen on imaging  -Repeat blood culture 2/4 no growth to date, continue Rocephin while inpatient can transition  to p.o. Keflex 500 mg twice daily through 2/13/ 2025 inclusive when stable for DC per ID       Cholestasis / Hyperbilirubinemia / Transaminitis  Gallbladder sludge on CT  MRCP has been done  GI following, appreciate recommendations  Labs today with improvement in transaminases, T. bili is normal  -GI signed off 2/4    Constipation  -miralax and senna-kot  -mivf     NAGMA  Nephrology following, creatinine stable hold bicarb drip improved     Anemia  Hemoglobin 7.3  Iron studies show ida, given iv iron           AKI on CKD III  Not far from baseline     Hyponatremia   This is chronic issue  Improved, sodium 132 today     Afib / Hx of PE  Unable to tolerate p.o. meds started on IV Amio gtt.     HTN  Blood pressure currently controlled without medications     GERD / Dyslipidemia   Pantoprazole and atorvastatin    CRITICAL CARE ATTESTATION:  I had a face to face encounter with the patient, reviewed and interpreted patient data including clinical events, labs, images, vital signs, I/O's, and examined patient.  I have discussed the case and the plan and management of the patient's care with the consulting services, the bedside nurses and necessary ancillary providers.       NOTE OF PERSONAL INVOLVEMENT IN CARE   This patient has a high probability of imminent, clinically significant deterioration, which  requires the highest level of preparedness to intervene urgently. I participated in the decision-making and personally managed or directed the management of the following life and organ supporting interventions that required my frequent assessment to treat or prevent imminent deterioration.     I personally spent 40 minutes of critical care time.  This is time spent at this critically ill patient's bedside actively involved in patient care as well as the coordination of care and discussions with the patient's family.  This does not include any procedural time which has been billed separately.     Greater than 50 minutes  spent in prolonged care of this patient which includes reevaluation of patient/family, speaking to ancillary staff, consultants, and coordination of patient's care     Palliative care consulted       Code status: DNR  Prophylaxis: SCD  Care Plan discussed with: Patient, nurse, case manager, consultant  Dispo: Greater than 48 hours     Principal Problem:    Nausea and vomiting  Active Problems:    Bacteremia due to Escherichia coli    Sepsis due to Escherichia coli (HCC)    Elevated LFTs    Pyelonephritis    Transaminitis  Resolved Problems:    * No resolved hospital problems. *            Review of Systems:   Pertinent items are noted in HPI.         Vital Signs:    Last 24hrs VS reviewed since prior progress note. Most recent are:  BP 94/66   Pulse (!) 106   Temp 97.9 F (36.6 C) (Oral)   Resp 20   Ht 1.524 m (5')   Wt 63.5 kg (140 lb)   SpO2 98%   BMI 27.34 kg/m        Intake/Output Summary (Last 24 hours) at 06/09/2023 1352  Last data filed at 06/09/2023 0541  Gross per 24 hour   Intake 200 ml   Output 750 ml   Net -550 ml        Physical Examination:     I had a face to face encounter with this patient and independently examined them on 06/09/2023 as outlined below:          General :  no acute distress, ill appearing   HEENT: NCAT moist mucus membrane  Neck: supple, no JVD,   Chest: CTA   CVS: RRR S1 S2   Abd: soft NT ND BS+   Ext:  no edema  Neuro/Psych: pleasant mood and affect, PERLA EOMI MAE  Skin: warm              Data Review:    Review and/or order of clinical lab test  Review and/or order of tests in the radiology section of CPT  I personally reviewed  Image    I have independently reviewed and interpreted patient's lab and all other diagnostic data    Notes reviewed from all clinical/nonclinical/nursing services involved in patient's clinical care. Care coordination discussions were held with appropriate clinical/nonclinical/ nursing providers based on care coordination needs.     Labs:     Recent  Labs     06/08/23  0309 06/09/23  0041   WBC 9.7 10.6   HGB 7.3* 7.7*   HCT 23.1* 24.3*   PLT 379 416*     Recent Labs     06/07/23  0557 06/08/23  0309 06/09/23  0629   NA 136 136 136  K 3.9 4.1 4.0   CL 111* 110* 108   CO2 15* 17* 20*   BUN 39* 34* 32*   PHOS  --   --  2.3*     Recent Labs     06/07/23  0557 06/08/23  0309   ALT 672* 458*   GLOB 3.5 2.4     No results for input(s): "INR", "APTT" in the last 72 hours.    Invalid input(s): "PTP"   No results for input(s): "TIBC" in the last 72 hours.    Invalid input(s): "FE", "PSAT", "FERR"     No results found for: "RBCF"   No results for input(s): "PH", "PCO2", "PO2" in the last 72 hours.  No results for input(s): "CPK" in the last 72 hours.    Invalid input(s): "CPKMB", "CKNDX", "TROIQ"  No results found for: "CHOL", "CHLST", "CHOLV", "HDL", "HDLC", "LDL", "LDLC"  No results found for: "GLUCPOC"        Medications Reviewed:     Current Facility-Administered Medications   Medication Dose Route Frequency    amiodarone (NEXTERONE) 150 mg in dextrose 5% 100 ml  150 mg IntraVENous Once    Followed by    amiodarone (NEXTERONE) 360 mg in dextrose 5% 200 ml  1 mg/min IntraVENous Continuous    Followed by    amiodarone (NEXTERONE) 360 mg in dextrose 5% 200 ml  0.5 mg/min IntraVENous Continuous    traMADol (ULTRAM) tablet 25 mg  25 mg Oral Q12H PRN    iron sucrose (VENOFER) injection 200 mg  200 mg IntraVENous Q24H    polyethylene glycol (GLYCOLAX) packet 17 g  17 g Oral BID    sennosides-docusate sodium (SENOKOT-S) 8.6-50 MG tablet 2 tablet  2 tablet Oral BID    atorvastatin (LIPITOR) tablet 10 mg  10 mg Oral Daily    pantoprazole (PROTONIX) tablet 40 mg  40 mg Oral QAM AC    sodium chloride flush 0.9 % injection 5-40 mL  5-40 mL IntraVENous 2 times per day    sodium chloride flush 0.9 % injection 5-40 mL  5-40 mL IntraVENous PRN    0.9 % sodium chloride infusion   IntraVENous PRN    ondansetron (ZOFRAN-ODT) disintegrating tablet 4 mg  4 mg Oral Q8H PRN    Or     ondansetron (ZOFRAN) injection 4 mg  4 mg IntraVENous Q6H PRN    polyethylene glycol (GLYCOLAX) packet 17 g  17 g Oral Daily PRN    acetaminophen (TYLENOL) tablet 650 mg  650 mg Oral Q6H PRN    Or    acetaminophen (TYLENOL) suppository 650 mg  650 mg Rectal Q6H PRN    sodium chloride 0.9 % bolus 100 mL  100 mL IntraVENous Once    cefTRIAXone (ROCEPHIN) 2,000 mg in sterile water 20 mL IV syringe  2,000 mg IntraVENous Q24H     ______________________________________________________________________  EXPECTED LENGTH OF STAY: Unable to retrieve estimated LOS  ACTUAL LENGTH OF STAY:          3                 Ezequiel Kayser, MD

## 2023-06-10 LAB — EKG 12-LEAD
Q-T Interval: 424 ms
QRS Duration: 166 ms
QTc Calculation (Bazett): 576 ms
R Axis: 65 degrees
T Axis: 250 degrees
Ventricular Rate: 111 {beats}/min

## 2023-06-10 MED ORDER — MORPHINE SULFATE (PF) 4 MG/ML IV SOLN
4 | INTRAVENOUS | Status: DC | PRN
Start: 2023-06-10 — End: 2023-06-10
  Administered 2023-06-10: 02:00:00 4 mg via INTRAVENOUS

## 2023-06-10 MED ORDER — MORPHINE SULFATE (PF) 2 MG/ML IV SOLN
2 | INTRAVENOUS | Status: DC | PRN
Start: 2023-06-10 — End: 2023-06-10

## 2023-06-10 MED ORDER — GLYCOPYRROLATE 0.2 MG/ML IJ SOLN
0.2 | INTRAMUSCULAR | Status: DC | PRN
Start: 2023-06-10 — End: 2023-06-10

## 2023-06-10 MED ORDER — DIAZEPAM 5 MG/ML IJ SOLN
5 | INTRAMUSCULAR | Status: DC | PRN
Start: 2023-06-10 — End: 2023-06-10

## 2023-06-10 MED ORDER — LOPERAMIDE HCL 2 MG PO CAPS
2 | ORAL | Status: DC | PRN
Start: 2023-06-10 — End: 2023-06-10

## 2023-06-10 MED ORDER — FUROSEMIDE 40 MG PO TABS
40 | ORAL_TABLET | Freq: Every day | ORAL | 0 refills | Status: AC | PRN
Start: 2023-06-10 — End: 2023-07-10

## 2023-06-10 MED ORDER — CEPHALEXIN 500 MG PO CAPS
500 | ORAL_CAPSULE | Freq: Two times a day (BID) | ORAL | 0 refills | Status: AC
Start: 2023-06-10 — End: 2023-06-16

## 2023-06-10 MED FILL — MORPHINE SULFATE 4 MG/ML IV SOLN: 4 mg/mL | INTRAVENOUS | Qty: 1

## 2023-06-10 NOTE — Care Coordination-Inpatient (Addendum)
Transition of Care Plan:    RUR: 20%   Prior Level of Functioning: ALF @ [name]   Disposition: Return to [name] w/ Hospice "At Home Care"   Room#119 on the [facility] unit and report#[phone].  Per conversation with the Hospice Liaison w/ At [facility];  She reported that they would assess and admit the pt upon arrival. She reported that scripts are not needed for discharge as they will assess and prescribe as needed.   EDD: Today   Follow up appointments: Hospice   DME needed: None   Transportation at discharge: BLS/H2H 1400  IM/IMM Medicare/Tricare letter given: Received   Caregiver Contact:   Livick,Janet (Child) [phone]  Discharge Caregiver contacted prior to discharge? N./A   Care Conference needed? N/A       1254  Per conversation with Duwayne Heck SW [facility] ;   U:[phone]/ [phone] or [phone]  Room assignment update: The pt will now be discharged to RM:119 on the [facility] Unit.     Update provided to the pt and family at the bedside.     11:59;  Per conversation with the [facility] Nurse;   She reported that the pt does not qualify for GIP Hospice at this time. This cm informed her that he would follow back up with the pt's daughter.     Per conversation with the pt's daughter;   She reported that they would like the pt to return to the [facility] "healthcare" with Hospice services.     Per conversation with Duwayne Heck SW @ [facility] ;   [phone]/ [phone] or [phone]  she reported that the pt would go to room 116 on the [facility] unit and requested transport be arranged for 2pm, and the report#[phone]. This cm informed her that a referral has been sent to At [facility] for Hospice services.          10:32a  Per conversation with the pt's daughter; this cm introduced himself and discussed the [facility] plan. This cm engaged the pt's daughter in a conversation regarding Hospice. This cm  inquired if family preferred for the pt to return to [facility] vs being evaluated by [facility] to determine if the pt qualifies for GIP.  The pt's daughter reported that they would be amenable to pt remaining IP if she qualifies but reported that they would also be open to the pt returning [facility] with Hospice. This cm informed her that he would send a referral and follow up with the SW at the [facility] to provide an update and to gather information regarding the discharge process for returning with Hospice. She reported that her brother was in rotue to the hospital from [facility] and should arrive around 12pm and he would assist with making the decision.       Per conversation with Duwayne Heck SW [facility] ;   I:[phone]/ [phone] or [phone]  This cm provided her with an update regarding the pt and GOC. She reported that if the pt returns to the [facility] with Hospice she would not be able to return to her apartment but would have to transition to Healthcare. She reported that they currently have a room available for the pt, If family prefers for the pt to return. She reported that their preferred partner is At [facility] for Hospice. She reiterated that the facility is not able to admit after 5pm and they do not accept weekend admissions. This  cm informed her that he would keep her posted. This cm informed her that he would send updated clinicals via fax so that they are able to follow.

## 2023-06-10 NOTE — Plan of Care (Signed)
Problem: Safety - Adult  Goal: Free from fall injury  Outcome: Completed

## 2023-06-10 NOTE — Discharge Instructions (Signed)
To access the American Heart Association's Interactive Workbook "Healthier Living with Heart Failure - Managing Symptoms and Reducing Risk"  Scan the QR code below.

## 2023-06-10 NOTE — Progress Notes (Signed)
Infectious Diseases Follow Up    06/10/2023      Assessment & Plan:     89 y/o female with sepsis with E coli bacteremia and transaminitis.    E.coli bacteremia  Transaminitis  Leukocytosis - resolved  Urine cx GNR  Repeat blood cx 2/4 NGTD  Suspected urinary source and pyelonephritis.     Continue ceftriaxone while inpatient. Can transition to PO Keflex 500 mg BID through 06/16/23, inclusive, when stable for discharge.      Trend liver enzymes and bilirubin - improving       D/w Dr. Dagoberto Ligas, pt, pt'sfamily        Anti-infectives:   ceftriaxone    Subjective:   Feels better, denies abdominal pain    Objective:     Vitals: BP (!) 89/55   Pulse 92   Temp 98.1 F (36.7 C) (Oral)   Resp 17   Ht 1.524 m (5')   Wt 63.5 kg (140 lb)   SpO2 95%   BMI 27.34 kg/m      Tmax:  Temp (24hrs), Avg:97.9 F (36.6 C), Min:97.7 F (36.5 C), Max:98.1 F (36.7 C)      Exam:   Patient is intubated:  no    Exam:        GEN: NAD  HEENT: Normocephalic, atraumatic  CV: normal rate  Lungs: Nl effort, 2 LPM  Abdomen: soft, non distended, non tender  Genitourinary: no foley  Extremities: no edema  Neuro: Alert, oriented to time, place and situation, moves all extremities to commands, verbal   Skin: no rash  Psych: good affect, pleasant       Labs:        Invalid input(s): "ITNL"   No results for input(s): "CPK", "CKMB" in the last 72 hours.    Invalid input(s): "TROIQ"  Recent Labs     06/08/23  0309 06/09/23  0041 06/09/23  0629   NA 136  --  136   K 4.1  --  4.0   CL 110*  --  108   CO2 17*  --  20*   BUN 34*  --  32*   PHOS  --   --  2.3*   WBC 9.7 10.6  --    HGB 7.3* 7.7*  --    HCT 23.1* 24.3*  --    PLT 379 416*  --      No results for input(s): "INR", "APTT" in the last 72 hours.    Invalid input(s): "PTP"          Cultures:   @MICRORESULTS @      Radiology:     Medications:          Current Facility-Administered Medications:     loperamide (IMODIUM) capsule 2 mg, 2 mg, Oral, PRN, Wolcott, Sarah A, APRN - NP    morphine (PF)  injection 1 mg, 1 mg, IntraVENous, Q2H PRN **OR** morphine (PF) injection 4 mg, 4 mg, IntraVENous, Q2H PRN, Wolcott, Shawn Route, APRN - NP, 4 mg at 06/09/23 2034    glycopyrrolate (ROBINUL) injection 0.2 mg, 0.2 mg, IntraVENous, Q4H PRN, Wolcott, Sarah A, APRN - NP    diazePAM (VALIUM) injection 2.5 mg, 2.5 mg, IntraVENous, Q2H PRN, Wolcott, Sarah A, APRN - NP    traMADol (ULTRAM) tablet 25 mg, 25 mg, Oral, Q12H PRN, Jory Sims, APRN - NP, 25 mg at 06/09/23 1830    ondansetron (ZOFRAN-ODT) disintegrating tablet 4 mg, 4 mg, Oral, Q8H PRN **OR**  ondansetron (ZOFRAN) injection 4 mg, 4 mg, IntraVENous, Q6H PRN, Ngwafang, Bleck B, MD, 4 mg at 06/07/23 2155    polyethylene glycol (GLYCOLAX) packet 17 g, 17 g, Oral, Daily PRN, Ngwafang, Bleck B, MD    acetaminophen (TYLENOL) tablet 650 mg, 650 mg, Oral, Q6H PRN, 650 mg at 06/09/23 0538 **OR** acetaminophen (TYLENOL) suppository 650 mg, 650 mg, Rectal, Q6H PRN, Ngwafang, Bleck B, MD    Thank you for the opportunity to participate in the care of this patient.      A total time of 35 minutes was spent on today's encounter.  Greater than 50% of the time was spent on the following:  Preparing for visit and chart review.  Obtaining and/or reviewing separately obtained history  Performing a medically appropriate exam and/or evaluation  Counseling and educating a patient/family/caregiver as noted above  Placing relevant orders  Referring and communicating with other professionals (not separately reported)  Independently interpreting results (not separately reported) and communicating results to the patient/family/caregiver  Care coordination (not separately reported) as noted above  Documenting clinical information in the electronic health records (e.g. problem list, visit note) on the day of the encounter     Lance Muss, APRN - NP

## 2023-06-10 NOTE — Discharge Summary (Signed)
 Discharge Summary       PATIENT ID: Mary Woodward  MRN: [id number]   DATE OF BIRTH: [birth year]    DATE OF ADMISSION: 06/05/2023  9:10 PM    DATE OF DISCHARGE: 06/10/23    PRIMARY CARE PROVIDER: Darrell Jewel, MD     ATTENDING PHYSICIAN: Ezequiel Kayser, MD   DISCHARGING PROVIDER: Ezequiel Kayser, MD    To contact this individual call [phone] and ask the operator to page.  If unavailable ask to be transferred the Adult Hospitalist Department.    CONSULTATIONS: IP CONSULT TO HOSPITALIST  IP CONSULT TO GI  IP CONSULT TO INFECTIOUS DISEASES  IP CONSULT TO NEPHROLOGY  IP CONSULT TO CARDIOLOGY  IP CONSULT TO PALLIATIVE CARE  IP CONSULT TO CARDIOLOGY  IP CONSULT TO HOSPICE  IP CONSULT TO CASE MANAGEMENT    PROCEDURES/SURGERIES: * No surgery found *     ADMITTING DIAGNOSES & HOSPITAL COURSE:   HPI: "Per H&P 2/2   Mary Woodward is a 89 y.o. female with with pmh of paroxysmal atrial fibrillation, HFpEF, pulmonary edema, hypertension, hyperlipidemia, PE, osteoarthritis, osteoporosis who presented to ed with complaints of abdominal pain, nausea and vomiting. Some cramping abdominal pain and constipation over the last week with nausea and non-bloody, non bilious emesis over the last 2 days.  Also with poor po intake, malaise and fatigue. Suffered a recent fall with a pelvic fracture; treated conservatively. Has had difficulty ambulating as a result.     The patient denies any fever, chills, chest pain, cough, congestion, recent illness, palpitations, or dysuria.  Remarkable vitals on ER Presentation: vss  Labs Remarkable for: wbc 25.6, hgb 8.6, na 128, cr 2.73, t-bili 2.1, alt 909, ast 926, alk phos 216, alb 2.6, UA: grossly infected.  ER Images: ct abd et pelvis: no acute process. Sludge in gallbladder  ER Rx: vanc, cefepime, 2.3l ns bolus, zofran"        DISCHARGE DIAGNOSES / PLAN:      Acute on chronic HFmef  -Overnight events noted with recurrent chest pain noted to be in A-fib RVR, repeat TTE pending status post IV  diuresis defer to cardiology for further continuation of diuretics  - I's and O's Daily weights  - A-fib RVR as discussed below  -Extensive family discussion held 2/6 and again this a.m., family and patient are clear and in agreement for hospice comfort measures do not want to pursue further cardiac workup or interventions, reached out to Dr. Dory Peru directly there was concern from family about cardiac medications, per Dr. Dory Peru family can let patient take what she wants, DC'd with as needed Lasix only continue metoprolol      Acute Cystitis/pyelonephritis  E. coli bacteremia secondary to above  Pyelonephritis as seen on imaging  -Repeat blood culture 2/4 no growth to date, continue Rocephin while inpatient can transition to p.o. Keflex 500 mg twice daily through 2/13/ 2025 inclusive when stable for DC per ID        Cholestasis / Hyperbilirubinemia / Transaminitis  Gallbladder sludge on CT  MRCP has been done  GI following, appreciate recommendations  Labs today with improvement in transaminases, T. bili is normal  -GI signed off 2/4     Constipation  -miralax and senna-kot  -mivf     NAGMA  Nephrology following, creatinine stable hold bicarb drip improved      Anemia  Hemoglobin 7.3 baseline was around 10s   Iron studies show ida, given iv iron eliquis on hold  AKI on CKD III  Not far from baseline     Hyponatremia   This is chronic issue  Improved, sodium 132 today     Afib / Hx of PE  Was unable to tolerate Amio DC'd, continue metoprolol eliquis    HTN  Blood pressure currently controlled without medications     GERD / Dyslipidemia   Pantoprazole and atorvastatin      Palliative care consulted    Family discussion held yesterday and again this a.m. who agreed for hospice.        Code status: DNR  Prophylaxis: SCD  Care Plan discussed with: Patient/family, RN, CM, consultant    DC back to [name] on hospice         FOLLOW UP APPOINTMENTS:    Follow-up Information       Follow up With Specialties Details Why  Contact Info    Darrell Jewel, MD Internal Medicine Call As needed [address]  [address]  [place] [address]  [phone]      Einar Grad, MD Cardiothoracic Surgery, Interventional Cardiology, Cardiology Call As needed [address]  [address]  [place] [address]  [phone]      Ethelene Hal, MD Nephrology Call As needed [address]  [address] [place] [address]  [phone]                ADDITIONAL CARE RECOMMENDATIONS: Comfort measures    DIET: Easy to chew      ACTIVITY: activity as tolerated    DISCHARGE MEDICATIONS:  Current Discharge Medication List        START taking these medications    Details   furosemide (LASIX) 40 MG tablet Take 1 tablet by mouth daily as needed (for le swlling, shortness of breath)  Qty: 30 tablet, Refills: 0  Start date: 06/10/2023, End date: 07/10/2023      cephALEXin (KEFLEX) 500 MG capsule Take 1 capsule by mouth 2 times daily for 6 days  Qty: 12 capsule, Refills: 0  Start date: 06/10/2023, End date: 06/16/2023           CONTINUE these medications which have NOT CHANGED    Details   lovastatin (MEVACOR) 10 MG tablet Take 1 tablet by mouth nightly      metoprolol succinate (TOPROL XL) 50 MG extended release tablet Take 1 tablet by mouth daily Hold for SBP<100      pantoprazole (PROTONIX) 40 MG tablet Take 1 tablet by mouth in the morning and at bedtime      !! ondansetron (ZOFRAN) 4 MG tablet Take 1 tablet by mouth in the morning and at bedtime      !! ondansetron (ZOFRAN) 4 MG tablet Take 1 tablet by mouth every 8 hours as needed for Nausea or Vomiting      polyethylene glycol (MIRALAX) 17 g PACK packet Take 17 g by mouth daily      ferrous sulfate (FE TABS 325) 325 (65 Fe) MG EC tablet Take 1 tablet by mouth every other day On even days      estradiol (ESTRACE) 0.1 MG/GM vaginal cream Place 0.5 g vaginally once a week On Mondays      !! acetaminophen (TYLENOL) 500 MG tablet Take 1 tablet by mouth in the morning, at noon, and at  bedtime      !! acetaminophen (TYLENOL) 500 MG tablet Take 1 tablet by mouth every 6 hours as needed for Pain  losartan (COZAAR) 25 MG tablet Take 1 tablet by mouth in the morning and at bedtime Hold for SBP<100      empagliflozin (JARDIANCE) 10 MG tablet Take 1 tablet by mouth daily  Qty: 30 tablet, Refills: 0      Multiple Vitamins-Minerals (PRESERVISION AREDS 2) CHEW Take 1 tablet by mouth daily       !! - Potential duplicate medications found. Please discuss with provider.        STOP taking these medications       amiodarone (CORDARONE) 200 MG tablet Comments:   Reason for Stopping:         apixaban (ELIQUIS) 2.5 MG TABS tablet Comments:   Reason for Stopping:                  NOTIFY YOUR PHYSICIAN FOR ANY OF THE FOLLOWING:   Fever over 101 degrees for 24 hours.   Chest pain, shortness of breath, fever, chills, nausea, vomiting, diarrhea, change in mentation, falling, weakness, bleeding. Severe pain or pain not relieved by medications.  Or, any other signs or symptoms that you may have questions about.    DISPOSITION:    Home With:   OT  PT  HH  RN       Long term SNF/Inpatient Rehab    Independent/assisted living   x Hospice    Other:       PATIENT CONDITION AT DISCHARGE:     Functional status    Poor    x Deconditioned     Independent      Cognition     Lucid    x Forgetful     Dementia      Catheters/lines (plus indication)    Foley     PICC     PEG    x None      Code status     Full code    x DNR      PHYSICAL EXAMINATION AT DISCHARGE:    General : alert x 3, awake, no acute distress,   HEENT: PEERL, EOMI, moist mucus membrane  Neck: supple, no JVD, no meningeal signs  Chest: Clear to auscultation bilaterally   CVS: S1 S2 heard, Capillary refill less than 2 seconds  Abd: soft/ Non tender, non distended, BS physiological,   Ext: no clubbing, no cyanosis, no edema, brisk 2+ DP pulses  Neuro/Psych: pleasant mood and affect, CN 2-12 grossly intact, sensory grossly within normal limit, Strength 5/5 in all  extremities  Skin: warm     CHRONIC MEDICAL DIAGNOSES:  Principal Problem:    Nausea and vomiting  Active Problems:    Bacteremia due to Escherichia coli    Sepsis due to Escherichia coli (HCC)    Elevated LFTs    Pyelonephritis    Transaminitis    Acute on chronic combined systolic and diastolic CHF (congestive heart failure) (HCC)    Bacteremia  Resolved Problems:    * No resolved hospital problems. *        Greater than 31 minutes were spent with the patient on counseling and coordination of care    Signed:   Ezequiel Kayser, MD  06/10/2023  1:18 PM

## 2023-06-10 NOTE — Progress Notes (Signed)
[facility]   [address] [place], [place] [address]  Phone: [phone]   [phone]    [email or web address]     Nephrology Progress Note    Patient Name : Mary Woodward     DOB : [birth year]     MRN : [id number]  Date of Admission : 06/05/2023  Date of Servive : 06/10/23    CC:  Follow up for AKI       Assessment and Plan   AKI:  - from IVVD in the setting of N/V and pyelo  - feeling better this AM  - plans noted for comfort care, hospice consulted  - no changes at this time     CKD 3b:  - baseline Cr 1.4 to 1.6  - not f/b a nephrologist     NGMA:  - 2/2 renal failure  - improving  - hold bcb drip    Fluid overload:  - lasix 40mg  IV x 1 now     Transaminitis  Cholestasis     Pyelonephritis:  - GNR in urine and blood    E. Coli bacteremia    HFrEF:  - EF 20-25% 10/07/22      Will sign off case.  Please call us back with any issues     Interval History:  Seen and examined.  Family at bedside.  Plans noted for hospice consult.  Family wish to focus on comfort.   No labs today.    Review of Systems: Pertinent items are noted in HPI.    Current Medications:   Current Facility-Administered Medications   Medication Dose Route Frequency    loperamide (IMODIUM) capsule 2 mg  2 mg Oral PRN    morphine (PF) injection 1 mg  1 mg IntraVENous Q2H PRN    Or    morphine (PF) injection 4 mg  4 mg IntraVENous Q2H PRN    glycopyrrolate (ROBINUL) injection 0.2 mg  0.2 mg IntraVENous Q4H PRN    diazePAM (VALIUM) injection 2.5 mg  2.5 mg IntraVENous Q2H PRN    traMADol (ULTRAM) tablet 25 mg  25 mg Oral Q12H PRN    ondansetron (ZOFRAN-ODT) disintegrating tablet 4 mg  4 mg Oral Q8H PRN    Or    ondansetron (ZOFRAN) injection 4 mg  4 mg IntraVENous Q6H PRN    polyethylene glycol (GLYCOLAX) packet 17 g  17 g Oral Daily PRN    acetaminophen (TYLENOL) tablet 650 mg  650 mg Oral Q6H PRN    Or    acetaminophen (TYLENOL) suppository 650 mg  650 mg Rectal Q6H PRN      No Known  Allergies    Objective:  Vitals:    Vitals:    06/09/23 2130 06/09/23 2200 06/09/23 2229 06/10/23 0820   BP: (!) 115/44  (!) 80/65 (!) 89/55   Pulse: 98 96 (!) 110 92   Resp:   20 17   Temp:   97.9 F (36.6 C) 98.1 F (36.7 C)   TempSrc:   Oral Oral   SpO2: 95%  94% 95%   Weight:       Height:         Intake and Output:  No intake/output data recorded.  02/05 1901 - 02/07 0700  In: 280 [P.O.:280]  Out: 1750 [Urine:1750]    Physical Examination:  General: NAD,Conversant   Neck:  Supple, no mass  Resp:  Bibasilar crackles, on O2  CV:  RRR,  no murmur or rub, trace LE edema  GI:  Soft, NT, + BS, no HS megaly  Neurologic:  Non focal  Psych:             AAO x 3 appropriate affect   Skin:  No Rash     []     High complexity decision making was performed  []     Patient is at high-risk of decompensation with multiple organ involvement    Lab Data Personally Reviewed: I have reviewed all the pertinent labs, microbiology data and radiology studies during assessment.    Labs:  Recent Labs     06/08/23  0309 06/09/23  0629   NA 136 136   K 4.1 4.0   CL 110* 108   CO2 17* 20*   GLUCOSE 94 87   BUN 34* 32*   CREATININE 1.42* 1.39*   CALCIUM 7.8* 8.0*       Recent Labs     06/08/23  0309 06/09/23  0041   WBC 9.7 10.6   RBC 2.81* 2.94*   HGB 7.3* 7.7*   HCT 23.1* 24.3*   MCV 82.2 82.7   MCH 26.0 26.2   MCHC 31.6 31.7   RDW 21.9* 22.1*   PLT 379 416*   MPV 10.5 10.6     Recent Labs     06/08/23  0309   GLOB 2.4     No results for input(s): "INR", "APTT" in the last 72 hours.    Invalid input(s): "PTP"   No results for input(s): "CPK", "CKMB", "TROPONINI" in the last 72 hours.    Invalid input(s): "B-NP"  Invalid input(s): "PHI", "PCO2I", "PO2I", "FIO2I"     Ventilator:       Microbiology:  No results found for: "SDES"  No components found for: "CULT"      I have reviewed the flowsheets.  Chart and Pertinent Notes have been reviewed.   No change in PMH ,family and social history from Consult note.      Sallyanne Havers, MD  [facility]

## 2023-06-10 NOTE — Progress Notes (Addendum)
 [facility] Cardiology Progress Note    Usual Cardiologist: Dr. 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); NYHA class II on admission  Repeat TTE was ordered; this has been deferred in setting of her deciding on hospice  Agree with spot diuresis  Keep slightly net negative  Strict Is and Os, daily weights  Troponin elevation  Likely non-MI troponin elevation versus type 2 MI from anemia, CHF, AF with RVR  HS troponin trend is flat  No ischemic evaluation recommended at this juncture  Acute pulmonary edema  Probably related to sepsis which drove AF with RVR and perhaps excessive IVF leading to CHF  Treatment as above  Paroxysmal atrial fibrillation s/p DCCV 10/07/22 with recurrence of atrial fibrillation, now in SR on amiodarone; recurrent AF in setting of sepsis now  Unable to take PO medications currently  Had hypotension with IV amiodarone; rates reasonable controlled  Stage IIIb CKD  HTN  HL  Normocytic anemia  E. Coli bacteremia, pyelonephritis  Seemed substantially improved  Transaminitis    Discussed the above with the patient and family at bedside.    We will sign-off, but are available if additional questions arise.  Thank you for the opportunity to participate in the care of Mary Woodward and please do not hesitate to contact us should you have any questions.       Subjective:  Had chest pain overnight.  Received morphine and she went to sleep.  No pain since then.  Breathing is comfortable at rest.  Denies dizziness.    Family present.  Says they have opted for hospice.      Objective:    Temp (24hrs), Avg:97.9 F (36.6 C), Min:97.7 F (36.5 C), Max:98.1 F (36.7 C)    Patient Vitals for the past 8 hrs:   Pulse   06/10/23 0820 92    Patient Vitals for the past 8 hrs:   Resp   06/10/23 0820 17    Patient Vitals for the past 8 hrs:   BP    06/10/23 0820 (!) 89/55          Intake/Output Summary (Last 24 hours) at 06/10/2023 1148  Last data filed at 06/10/2023 1027  Gross per 24 hour   Intake 80 ml   Output 1300 ml   Net -1220 ml       Physical Exam  GEN: NAD, appears stated age  HEENT: EOMI   NECK: Normal JVP  CV: Irregular, normal S1 and S2, no M/R/G  LUNGS: CTAB, no W/R/R  ABD: Soft, ND  EXT: No edema, 2+ and symmetrical radial pulses b/l  PSYCH: Mood and affect normal  NEURO: Alert, MAEW, face symmetrical, speech intact    Current Facility-Administered Medications   Medication Dose Route Frequency    loperamide (IMODIUM) capsule 2 mg  2 mg Oral PRN    morphine (PF) injection 1 mg  1 mg IntraVENous Q2H PRN    Or    morphine (PF) injection 4 mg  4 mg IntraVENous Q2H PRN    glycopyrrolate (ROBINUL) injection 0.2 mg  0.2 mg IntraVENous Q4H PRN    diazePAM (VALIUM) injection 2.5 mg  2.5 mg IntraVENous Q2H PRN    traMADol (ULTRAM) tablet 25 mg  25 mg Oral Q12H PRN    ondansetron (ZOFRAN-ODT) disintegrating tablet 4 mg  4  mg Oral Q8H PRN    Or    ondansetron (ZOFRAN) injection 4 mg  4 mg IntraVENous Q6H PRN    polyethylene glycol (GLYCOLAX) packet 17 g  17 g Oral Daily PRN    acetaminophen (TYLENOL) tablet 650 mg  650 mg Oral Q6H PRN    Or    acetaminophen (TYLENOL) suppository 650 mg  650 mg Rectal Q6H PRN       Data Reviewed:  No results for input(s): "PH", "PCO2", "PO2" in the last 72 hours.  Recent Labs     06/08/23  1810 06/08/23  2119 06/09/23  0004   TROPHS 210* 213* 212*     Recent Labs     06/08/23  0309 06/09/23  0041 06/09/23  0629   NA 136  --  136   K 4.1  --  4.0   CL 110*  --  108   CO2 17*  --  20*   BUN 34*  --  32*   PHOS  --   --  2.3*   WBC 9.7 10.6  --    HGB 7.3* 7.7*  --    HCT 23.1* 24.3*  --    PLT 379 416*  --      Recent Labs     06/08/23  0309   GLOB 2.4     No results for input(s): "INR", "APTT" in the last 72 hours.    Invalid input(s): "PTP"   No results for input(s): "TIBC" in the last 72 hours.    Invalid input(s): "FE",  "PSAT", "FERR"   No results found for: "GLUCPOC"    Recent Results (from the past 24 hour(s))   Lactic Acid    Collection Time: 06/09/23  4:36 PM   Result Value Ref Range    Lactic Acid, Plasma CALLED TO AND READ BACK BY 0.4 - 2.0 MMOL/L   EKG 12 Lead    Collection Time: 06/09/23  6:52 PM   Result Value Ref Range    Ventricular Rate 111 BPM    QRS Duration 166 ms    Q-T Interval 424 ms    QTc Calculation (Bazett) 576 ms    R Axis 65 degrees    T Axis 250 degrees    Diagnosis       Atrial fibrillation with rapid ventricular response  Left bundle branch block  Possible Lateral infarct , age undetermined  Abnormal ECG  When compared with ECG of 09-Jun-2023 01:17,  No significant change was found  Confirmed by Arnoldo Lenis, M.D., [place] ([id number]) on 06/10/2023 11:14:59 AM         Telemetry (personally reviewed): Not on telemetry    Signed:  Kermit Balo. Dory Peru, MD  Structural Interventional Cardiology  [facility]  06/10/23

## 2023-06-10 NOTE — Progress Notes (Signed)
Palliative Medicine  Patient Name: Mary Woodward  Date of Birth: [birth year]  MRN: [id number]  Age: 89 y.o.  Gender: female    Date of Initial Consult: 06/09/2023  Date of Service: 06/10/2023  Time: 11:14 AM  Provider: Gray Bernhardt, APRN - [facility] Day: 6  Admit Date: 06/05/2023  Referring Provider: Ezequiel Kayser, MD      Reasons for Consultation:  Goals of Care    HISTORY OF PRESENT ILLNESS (HPI):   Mary Woodward is a 89 y.o. female with a past medical history of A-fib, HFpEF, pulmonary edema, HTN, HLD, who was admitted on 06/05/2023 from ALF with complaints of nausea, vomiting, and abdominal pain.     Psychosocial: Patient widowed and have three adult children Mary Woodward, Mary Woodward and Mary Woodward). 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      PALLIATIVE DIAGNOSES:    Shortness of Breath  Fatigue  UTI  Generalized weakness  Physical debility  Goals of care  Palliative care encounter    ASSESSMENT AND PLAN:   Today, I am following up with Mary Woodward to address goals of care.  Reviewed medical chart including admit H&P, consultant notes, MAR, and recent labs/imaging.  Mary Woodward was seen and evaluated, daughter, Mary Woodward at bedside.   At time of my arrival, she was resting in bed in no acute distress.      Decisions/Goals: continue to address acute issues.   Daughter reported that they met with cardiologist yesterday and family decided that she has had a good life, and if she doesn't face further complications from her hear it will be n her kidneys.  Noted that patient is comfortable with transitioning to comfort care with the support of hospice.  Family wants to ensure that comfort is priority.  Daughter is hoping patient can remain in the hospital with hospice, but if not is ok with returning back to her apartment with hospice.  She stated that her brother is on his way to [place], from [facility] and together they will decide on what's best.  Advised that case manager will contact [name] to determine if she can return to ALF with hospice and communicate that to the family.  Other oprion would have [facility], evaluate to determine if she meets GIP admission.    Hospice consult already placed.     Code Status: DNR.      ACP: AMD on file    Plan: transition to hospice.      Initial consult note routed to primary continuity provider and/or primary health care team members  Thanks for consulting palliative medicine in the care of Mary Woodward.  We will sign off at this time.  Please call with any palliative questions or concerns.  Palliative Care Team is available via perfect serve or via phone.    Referrals to:   []  Outpatient Palliative Care  []  Home Based Palliative Care  []  Home Based Primary Care  []  Hospice       ADVANCE CARE PLANNING:   [x]  The Pall Med Interdisciplinary Team has updated the ACP Navigator with Health Care Decision Maker and Patient Capacity    Current Code Status: DNR     Goals of Care: Goals of Care and Interventions  Patient/Health Care Proxy Stated Goals: Recovery from acute illness  Medical Interventions: Full interventions  Artificially Administered Nutrition: No feeding tube  Life Goals  Patient and Family Personal Goals: Improve and return to  ALF    Please refer to Palliative Medicine ACP notes for further details.    PALLIATIVE ASSESSMENT:      Palliative Performance Scale (PPS):  PPS: 40    ECOG:   ECOG Status : Limited self-care [3]    Modified ESAS:  Modified-[place] Symptom Assessment Scale (ESAS)  Tiredness Score: 4  Drowsiness Score: 3  Depression Score: Not depressed  Pain Score: No pain  Anxiety Score: Not anxious  Nausea Score: Not nauseated  Appetite Score: 2  Dyspnea Score: 3  Wellbeing Score: 3  Other Problem Score: Best possible response    Clinical Pain Assessment (nonverbal scale for severity on nonverbal patients):   Clinical Pain Assessment  Severity: 0       NVPS:  Adult Nonverbal Pain Scale (NVPS)  Physiology (Vital Signs): Stable  vital signs  Respiratory: Baseline RR/SpO2 compliant with ventilator    RDOS:  RDOS  Heart rate per minute: 90-109  Respiratory Rate per Minute: less than 19  Restlessness:non-purposeful: None  Paradoxical breathing pattern:abdomen moves in on inspiration: None  Accessory muscle use: rise in clavicle during inspiration: None  Grunting at end-expiration: guttural sound: None  Nasal flaring: involuntary movement of nares: None  Look of fear: None  Total : 1      Vital Signs: Blood pressure (!) 89/55, pulse 92, temperature 98.1 F (36.7 C), temperature source Oral, resp. rate 17, height 1.524 m (5'), weight 63.5 kg (140 lb), SpO2 95%.    PHYSICAL ASSESSMENT:   General: [x]  Oriented x3  []  Well appearing  []  Intubated  [] Ill appearing  [] Other:  Mental Status: []  Normal mental status exam  [x]  Drowsy  []  Confused  [] Other:  Cardiovascular: [x]  Regular rate/rhythm  []  Arrhythmia  []  Other:  Chest: [x]  Effort normal  [] Lungs clear  []  Respiratory distress  [] Tachypnea  [x]  Other: Oxygen - 2 L via NC  Abdomen: [x]  Soft/non-tender  []  Normal appearance  []  Distended  []  Ascites  []  Other:  Neurological: [x]  Normal speech  []  Normal sensation  [] Deficits present:  Extremity: [x]  Normal skin color/temp  []  Clubbing/cyanosis  []  No edema  []  Other:    Wt Readings from Last 15 Encounters:   06/09/23 63.5 kg (140 lb)   10/11/22 57.7 kg (127 lb 3.3 oz)   09/20/22 62 kg (136 lb 11.2 oz)        Current Diet: ADULT DIET; Easy to Chew       PSYCHOSOCIAL/SPIRITUAL SCREENING:   Palliative IDT has assessed this patient for cultural preferences / practices and a referral made as appropriate to needs Gaffer, Patient Advocacy, Administrator, Civil Service, etc.)    Spiritual Affiliation: Unknown    Any spiritual / religious concerns:  []  Yes /  [x]  No   If "Yes" to discuss with pastoral care during IDT     Does caregiver feel burdened by caring for their loved one:   []  Yes /  [x]  No /  []  No Caregiver Present/Available []  No Caregiver []  Pt Lives  at Facility  If "Yes" to discuss with social work during IDT    Anticipatory grief assessment:   [x]  Normal  / []  Maladaptive     If "Maladaptive" to discuss with social work during IDT    ESAS Anxiety: Anxiety Score: Not anxious    ESAS Depression: Depression Score: Not depressed        LAB AND IMAGING FINDINGS:   Objective data reviewed:  labs, images, records, medication use, vitals, and  chart     FINAL COMMENTS   Thank you for allowing Palliative Medicine to participate in the care of Mary Woodward.    Only check if applicable and billing time based rather than MDM  [x]  The total encounter time on this service date was 55 minutes which was spent performing a face-to-face encounter and personally completing the provider-level activities documented in the note. This includes time spent prior to the visit and after the visit in direct care of the patient. This time does not include time spent in any separately reportable services.    Electronically signed by   Gray Bernhardt, APRN - CNP  Palliative Care Team  on 06/10/2023 at 11:14 AM

## 2023-06-12 LAB — CULTURE, BLOOD 2: Culture: NO GROWTH

## 2023-06-12 LAB — CULTURE, BLOOD 1: Culture: NO GROWTH

## 2023-07-02 DEATH — deceased
